# Patient Record
Sex: Female | Born: 1995 | Race: Black or African American | Hispanic: No | Marital: Single | Smoking: Former smoker
Health system: Southern US, Community
[De-identification: ages and names within clinical notes are randomized; demographics above are authoritative.]

## PROBLEM LIST (undated history)

## (undated) ENCOUNTER — Ambulatory Visit (HOSPITAL_COMMUNITY): Admission: EM | Payer: Medicaid Other | Source: Home / Self Care

## (undated) ENCOUNTER — Inpatient Hospital Stay (HOSPITAL_COMMUNITY): Payer: Self-pay

## (undated) ENCOUNTER — Ambulatory Visit (HOSPITAL_COMMUNITY): Admission: EM | Discharge status: Absent without leave | Payer: BLUE CROSS/BLUE SHIELD

## (undated) ENCOUNTER — Ambulatory Visit (HOSPITAL_COMMUNITY): Payer: BLUE CROSS/BLUE SHIELD | Source: Home / Self Care

## (undated) DIAGNOSIS — O139 Gestational [pregnancy-induced] hypertension without significant proteinuria, unspecified trimester: Secondary | ICD-10-CM

## (undated) DIAGNOSIS — Z789 Other specified health status: Secondary | ICD-10-CM

## (undated) HISTORY — PX: NO PAST SURGERIES: SHX2092

## (undated) HISTORY — DX: Gestational (pregnancy-induced) hypertension without significant proteinuria, unspecified trimester: O13.9

---
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
## 2017-04-10 ENCOUNTER — Encounter (HOSPITAL_COMMUNITY): Payer: Self-pay | Admitting: Emergency Medicine

## 2017-04-10 ENCOUNTER — Ambulatory Visit (HOSPITAL_COMMUNITY)
Admission: EM | Admit: 2017-04-10 | Discharge: 2017-04-10 | Disposition: A | Discharge status: Home / Self Care | Payer: BLUE CROSS/BLUE SHIELD | Attending: Family Medicine | Admitting: Family Medicine

## 2017-04-10 DIAGNOSIS — L03113 Cellulitis of right upper limb: Secondary | ICD-10-CM | POA: Diagnosis not present

## 2017-04-10 MED ORDER — HYDROXYZINE HCL 25 MG PO TABS: 25.0000 mg | ORAL_TABLET | Freq: Four times a day (QID) | ORAL | 0 refills | Status: DC

## 2017-04-10 MED ORDER — DOXYCYCLINE HYCLATE 100 MG PO CAPS
100.0000 mg | ORAL_CAPSULE | Freq: Two times a day (BID) | ORAL | 0 refills | Status: DC
Start: 2017-04-10 — End: 2018-12-23

## 2017-04-10 NOTE — ED Triage Notes (Signed)
[facility] a [facility] possibly infected [facility] that she had done 1 month ago.

## 2017-04-10 NOTE — ED Provider Notes (Signed)
CSN: [id number]     Arrival date & time 04/10/17  1754 History   None    Chief Complaint  Patient presents with  . Wound Check   (Consider location/radiation/quality/duration/timing/severity/associated sxs/prior Treatment) Patient c/o infected tattoo right forearm.     The history is provided by the patient.  Wound Check  This is a new problem. The problem occurs constantly.    History reviewed. No pertinent past medical history. History reviewed. No pertinent surgical history. History reviewed. No pertinent family history. Social History  Substance Use Topics  . Smoking status: Never Smoker  . Smokeless tobacco: Never Used  . Alcohol use No   OB History    No data available     Review of Systems  Constitutional: Negative.   HENT: Negative.   Eyes: Negative.   Respiratory: Negative.   Cardiovascular: Negative.   Gastrointestinal: Negative.   Endocrine: Negative.   Genitourinary: Negative.   Musculoskeletal: Negative.   Skin: Positive for rash.  Allergic/Immunologic: Negative.   Neurological: Negative.   Hematological: Negative.   Psychiatric/Behavioral: Negative.     Allergies  Tramadol and Other  Home Medications   Prior to Admission medications   Medication Sig Start Date End Date Taking? Authorizing Provider  doxycycline (VIBRAMYCIN) 100 MG capsule Take 1 capsule (100 mg total) by mouth 2 (two) times daily. 04/10/17   Deatra Canterxford, Delphia Kaylor J, FNP  hydrOXYzine (ATARAX/VISTARIL) 25 MG tablet Take 1 tablet (25 mg total) by mouth every 6 (six) hours. 04/10/17   Deatra Canterxford, Makeisha Jentsch J, FNP   Meds Ordered and Administered this Visit  Medications - No data to display  BP 138/63 (BP Location: Left Arm)   Pulse 72   Temp 98.4 F (36.9 C) (Oral)   Resp 18   SpO2 100%  No data found.   Physical Exam  Constitutional: She appears well-developed and well-nourished.  HENT:  Head: Normocephalic and atraumatic.  Eyes: Conjunctivae and EOM are normal. Pupils are equal,  round, and reactive to light.  Neck: Normal range of motion. Neck supple.  Cardiovascular: Normal rate, regular rhythm and normal heart sounds.   Pulmonary/Chest: Effort normal and breath sounds normal.  Skin:  Right forearm with recent tattoo that is raised and itchy.  Nursing note and vitals reviewed.   Urgent Care Course     Procedures (including critical care time)  Labs Review Labs Reviewed - No data to display  Imaging Review No results found.   Visual Acuity Review  Right Eye Distance:   Left Eye Distance:   Bilateral Distance:    Right Eye Near:   Left Eye Near:    Bilateral Near:         MDM   1. Cellulitis of right upper extremity    Doxycycline 100mg  one po bid x 10 days #20 Hydroxyzine 25 mg one po q 6 hours prn #24      Deatra CanterOxford, Cristalle Rohm J, FNP 04/10/17 1842

## 2017-09-18 ENCOUNTER — Ambulatory Visit (HOSPITAL_COMMUNITY)
Admission: EM | Admit: 2017-09-18 | Discharge: 2017-09-18 | Disposition: A | Discharge status: Home / Self Care | Payer: BLUE CROSS/BLUE SHIELD | Attending: Family Medicine | Admitting: Family Medicine

## 2017-09-18 ENCOUNTER — Encounter (HOSPITAL_COMMUNITY): Payer: Self-pay | Admitting: Family Medicine

## 2017-09-18 DIAGNOSIS — L509 Urticaria, unspecified: Secondary | ICD-10-CM

## 2017-09-18 MED ORDER — PREDNISONE 10 MG (21) PO TBPK: ORAL_TABLET | Freq: Every day | ORAL | 0 refills | Status: DC

## 2017-09-18 NOTE — ED Provider Notes (Signed)
  [facility]   [id number] 09/18/17 Arrival Time: 1213  ASSESSMENT & PLAN:  1. Hives     Meds ordered this encounter  Medications  . predniSONE (STERAPRED UNI-PAK 21 TAB) 10 MG (21) TBPK tablet    Sig: Take by mouth daily. Take as directed.    Dispense:  21 tablet    Refill:  0   Continue Benadryl as needed. Will f/u if not showing significant improvement over the next 24-48 hours.  Reviewed expectations re: course of current medical issues. Questions answered. Outlined signs and symptoms indicating need for more acute intervention. Patient verbalized understanding. After Visit Summary given.   SUBJECTIVE:  Teresa Harrington is a 21 y.o. female who presents with complaint of:  Rash: Abrupt onset yesterday after lunch. Similar rash approx 10-11 years ago. Torso and extremities. Much itching. Afebrile. No n/v. No respiratory symptoms. Benadryl without much help. No new exposures known.  ROS: As per HPI.  OBJECTIVE: Vitals:   09/18/17 1235  BP: 134/84  Pulse: 73  Resp: 18  Temp: 98.5 F (36.9 C)  SpO2: 100%    General appearance: alert; no distress Lungs: clear to auscultation bilaterally Heart: regular rate and rhythm Extremities: no edema Skin: warm and dry; torso and extremities with multiple wheals and dermatographism Psychological: alert and cooperative; normal mood and affect   Allergies  Allergen Reactions  . Tramadol Hives  . Other Hives    AGENT: Celery     Social History   Social History  . Marital status: Single    Spouse name: N/A  . Number of children: N/A  . Years of education: N/A   Occupational History  . Not on file.   Social History Main Topics  . Smoking status: Never Smoker  . Smokeless tobacco: Never Used  . Alcohol use No  . Drug use: No  . Sexual activity: Yes    Birth control/ protection: Condom   Other Topics Concern  . Not on file   Social History Narrative  . No narrative on file      Mardella LaymanHagler, Demetrion Wesby,  MD 09/18/17 1252

## 2017-09-18 NOTE — ED Triage Notes (Signed)
Pt here for possible allergic reaction to unknown substance. Itching rash all over. Took benadryl without relief.

## 2017-12-16 ENCOUNTER — Encounter (HOSPITAL_COMMUNITY): Payer: Self-pay | Admitting: Emergency Medicine

## 2017-12-16 ENCOUNTER — Emergency Department (HOSPITAL_COMMUNITY): Payer: Self-pay

## 2017-12-16 ENCOUNTER — Emergency Department (HOSPITAL_COMMUNITY)
Admission: EM | Admit: 2017-12-16 | Discharge: 2017-12-17 | Disposition: A | Discharge status: Home / Self Care | Payer: Self-pay | Attending: Physician Assistant | Admitting: Physician Assistant

## 2017-12-16 ENCOUNTER — Encounter (HOSPITAL_COMMUNITY): Payer: Self-pay

## 2017-12-16 ENCOUNTER — Other Ambulatory Visit: Payer: Self-pay

## 2017-12-16 ENCOUNTER — Emergency Department (HOSPITAL_COMMUNITY)
Admission: EM | Admit: 2017-12-16 | Discharge: 2017-12-16 | Disposition: A | Discharge status: Home / Self Care | Payer: Self-pay | Attending: Emergency Medicine | Admitting: Emergency Medicine

## 2017-12-16 ENCOUNTER — Emergency Department (HOSPITAL_COMMUNITY)
Admission: EM | Admit: 2017-12-16 | Discharge: 2017-12-16 | Discharge status: Absent without leave | Payer: BLUE CROSS/BLUE SHIELD

## 2017-12-16 DIAGNOSIS — R109 Unspecified abdominal pain: Secondary | ICD-10-CM

## 2017-12-16 DIAGNOSIS — B9689 Other specified bacterial agents as the cause of diseases classified elsewhere: Secondary | ICD-10-CM | POA: Insufficient documentation

## 2017-12-16 DIAGNOSIS — R1084 Generalized abdominal pain: Secondary | ICD-10-CM | POA: Insufficient documentation

## 2017-12-16 DIAGNOSIS — R1031 Right lower quadrant pain: Secondary | ICD-10-CM | POA: Insufficient documentation

## 2017-12-16 DIAGNOSIS — N760 Acute vaginitis: Secondary | ICD-10-CM | POA: Insufficient documentation

## 2017-12-16 DIAGNOSIS — R110 Nausea: Secondary | ICD-10-CM | POA: Insufficient documentation

## 2017-12-16 DIAGNOSIS — Z79899 Other long term (current) drug therapy: Secondary | ICD-10-CM | POA: Insufficient documentation

## 2017-12-16 DIAGNOSIS — R112 Nausea with vomiting, unspecified: Secondary | ICD-10-CM | POA: Insufficient documentation

## 2017-12-16 LAB — COMPREHENSIVE METABOLIC PANEL
ALBUMIN: 3.9 g/dL (ref 3.5–5.0)
ALT: 34 U/L (ref 14–54)
AST: 26 U/L (ref 15–41)
Alkaline Phosphatase: 79 U/L (ref 38–126)
Anion gap: 13 (ref 5–15)
BUN: 5 mg/dL — ABNORMAL LOW (ref 6–20)
CHLORIDE: 100 mmol/L — AB (ref 101–111)
CO2: 22 mmol/L (ref 22–32)
Calcium: 9.1 mg/dL (ref 8.9–10.3)
Creatinine, Ser: 0.78 mg/dL (ref 0.44–1.00)
GFR calc Af Amer: >60 mL/min (ref >60)
GLUCOSE: 146 mg/dL — AB (ref 65–99)
POTASSIUM: 3.7 mmol/L (ref 3.5–5.1)
SODIUM: 135 mmol/L (ref 135–145)
Total Bilirubin: 0.6 mg/dL (ref 0.3–1.2)
Total Protein: 7.7 g/dL (ref 6.5–8.1)

## 2017-12-16 LAB — URINALYSIS, ROUTINE W REFLEX MICROSCOPIC
Bilirubin Urine: NEGATIVE
GLUCOSE, UA: NEGATIVE mg/dL
HGB URINE DIPSTICK: NEGATIVE
KETONES UR: 5 mg/dL — AB
LEUKOCYTES UA: NEGATIVE
Nitrite: NEGATIVE
PH: 6 (ref 5.0–8.0)
Protein, ur: NEGATIVE mg/dL
Specific Gravity, Urine: 1.013 (ref 1.005–1.030)

## 2017-12-16 LAB — CBC
HEMATOCRIT: 43.5 % (ref 36.0–46.0)
Hemoglobin: 14.7 g/dL (ref 12.0–15.0)
MCH: 31 pg (ref 26.0–34.0)
MCHC: 33.8 g/dL (ref 30.0–36.0)
MCV: 91.8 fL (ref 78.0–100.0)
Platelets: 318 10*3/uL (ref 150–400)
RBC: 4.74 MIL/uL (ref 3.87–5.11)
RDW: 12.7 % (ref 11.5–15.5)
WBC: 8.3 10*3/uL (ref 4.0–10.5)

## 2017-12-16 LAB — I-STAT BETA HCG BLOOD, ED (MC, WL, AP ONLY): I-stat hCG, quantitative: <5 m[IU]/mL (ref <5)

## 2017-12-16 LAB — WET PREP, GENITAL
Sperm: NONE SEEN
Trich, Wet Prep: NONE SEEN
Yeast Wet Prep HPF POC: NONE SEEN

## 2017-12-16 LAB — LIPASE, BLOOD: LIPASE: 22 U/L (ref 11–51)

## 2017-12-16 IMAGING — US US TRANSVAGINAL NON-OB
1 series · 13 of 25 positions shown · non-contrast
Comparison: None.

CLINICAL DATA: Right lower quadrant pain for 2 days.

EXAM:
TRANSABDOMINAL AND TRANSVAGINAL ULTRASOUND OF PELVIS
DOPPLER ULTRASOUND OF OVARIES
TECHNIQUE: Both transabdominal and transvaginal ultrasound examinations of the
pelvis were performed. Transabdominal technique was performed for
global imaging of the pelvis including uterus, ovaries, adnexal
regions, and pelvic cul-de-sac.
It was necessary to proceed with endovaginal exam following the
transabdominal exam to visualize the endometrium and ovaries. Color
and duplex Doppler ultrasound was utilized to evaluate blood flow to
the ovaries.

[Series 1: us transvaginal non-ob · 0.20mm/px · 13 of 78 slices shown]
[im 1/78]
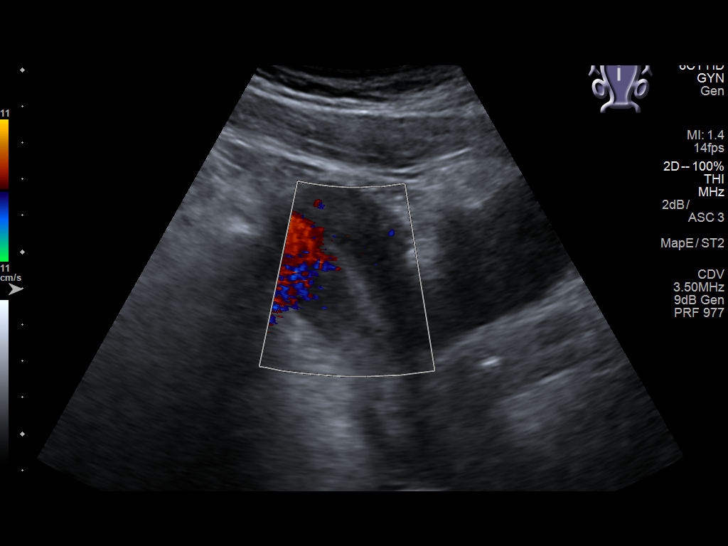
[im 7/78]
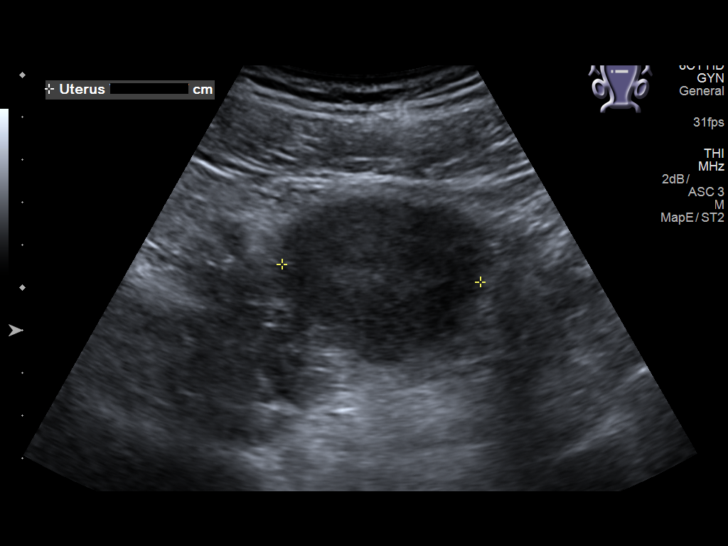
[im 13/78]
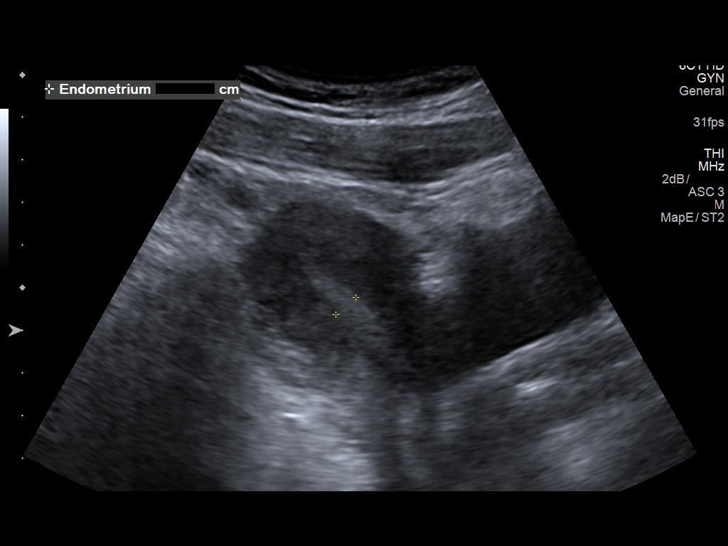
[im 20/78]
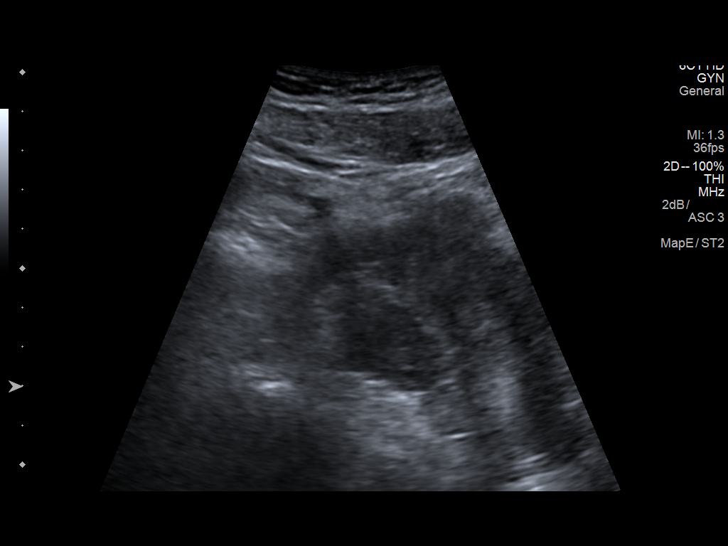
[im 26/78]
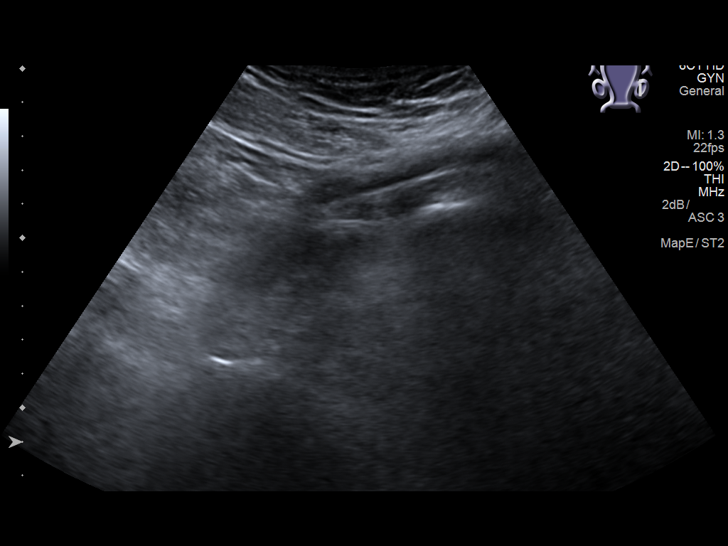
[im 33/78]
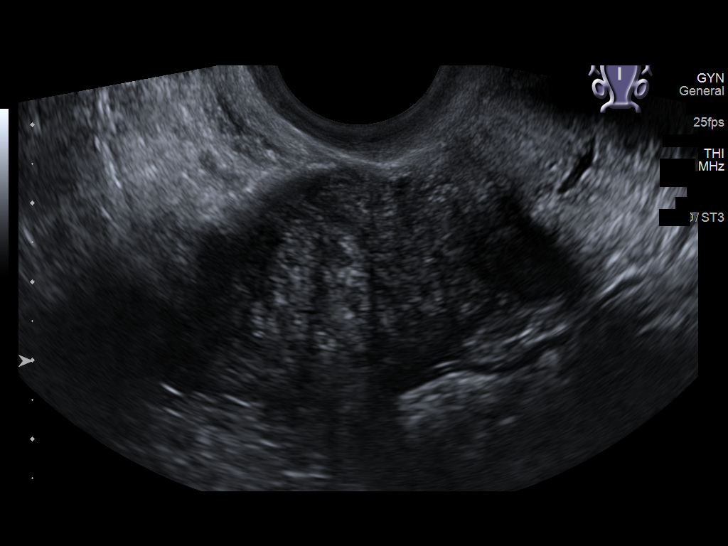
[im 39/78]
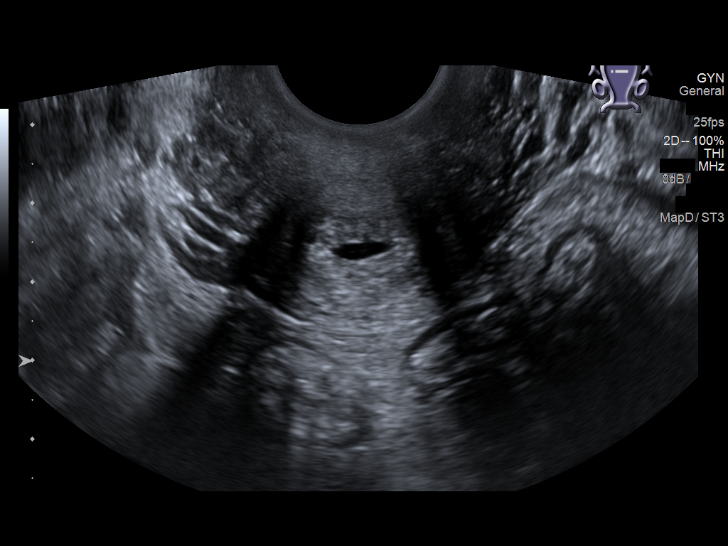
[im 45/78]
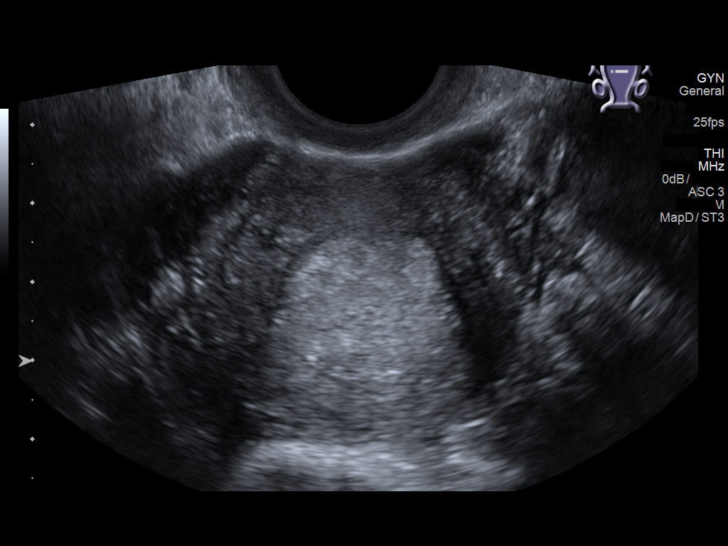
[im 52/78]
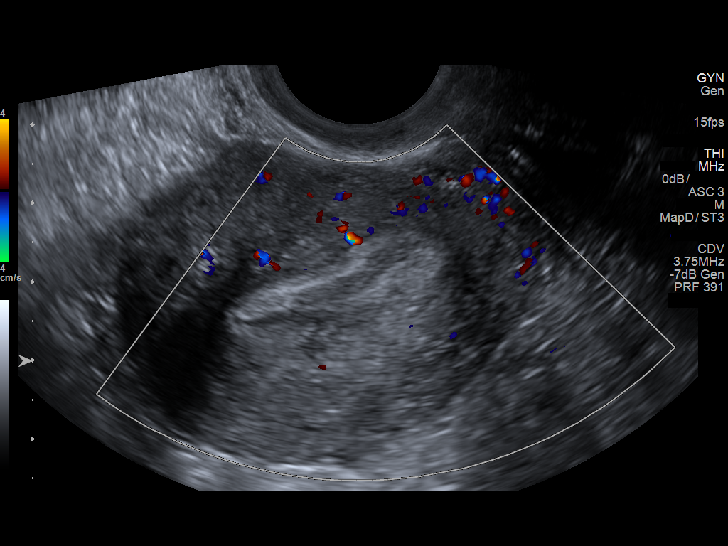
[im 58/78]
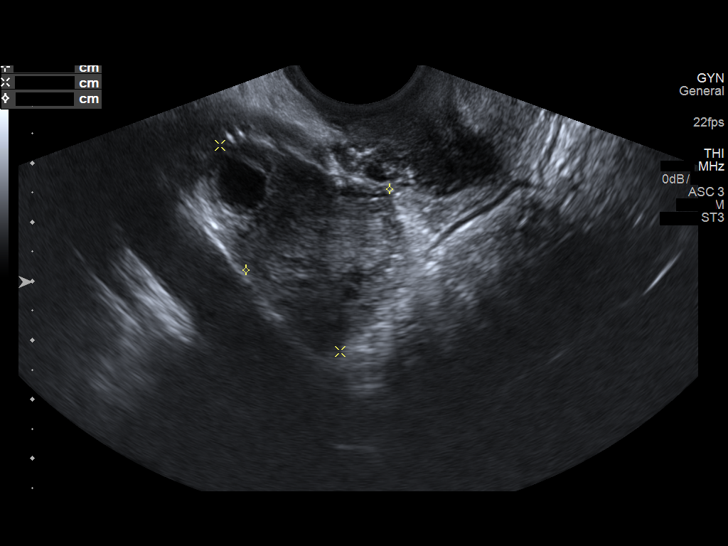
[im 65/78]
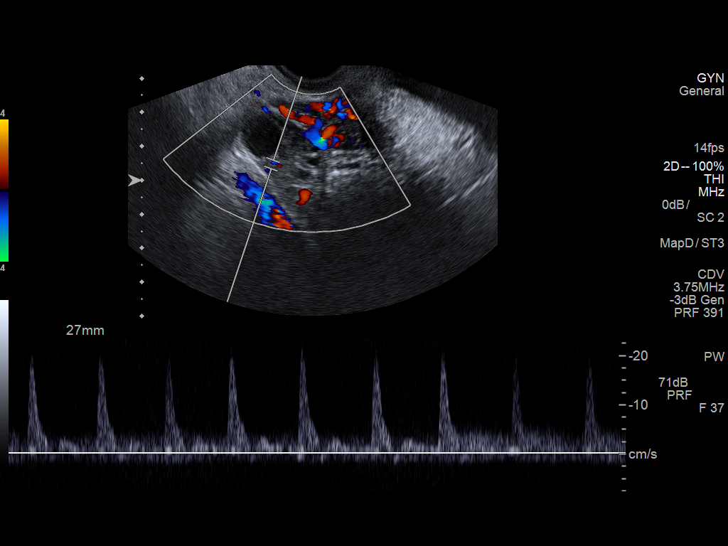
[im 71/78]
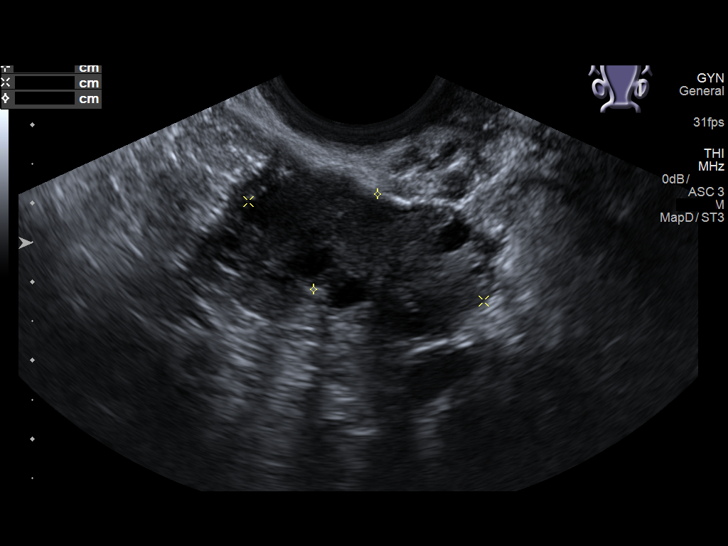
[im 78/78]
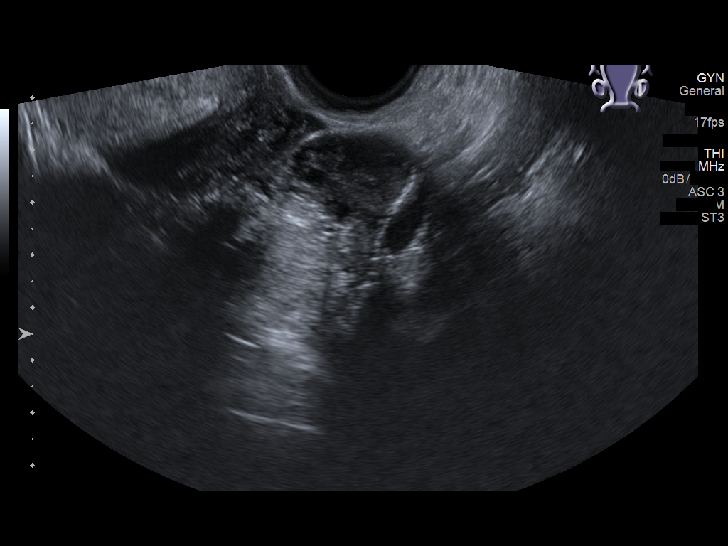

[13 of 25 positions shown; findings below may reference images not displayed]

FINDINGS: Uterus

Measurements: 8 x 3.7 x 5.6 cm. Uterus is anteverted. No fibroids or
other mass visualized. Small amount of fluid in the lower uterine
segment/endocervical canal. Small nabothian cysts.

Endometrium

Thickness: 11.3 mm.  No focal abnormality visualized.

Right ovary

Measurements: 4 x 2.8 x 3.8 cm. Normal follicular cystic changes.
Normal appearance/no adnexal mass.

Left ovary

Measurements: 3.3 x 1.5 x 2.1 cm. Normal follicular cystic changes.
Normal appearance/no adnexal mass.

Pulsed Doppler evaluation of both ovaries demonstrates normal
low-resistance arterial and venous waveforms.

Other findings

Small amount of free fluid in the pelvis.
IMPRESSION: Normal ultrasound appearance of the uterus and ovaries. Normal
follicular cystic changes. Small amount of fluid in the endocervical
canal and in the pelvis is likely physiologic.

## 2017-12-16 MED ORDER — METRONIDAZOLE 500 MG PO TABS: 500.0000 mg | ORAL_TABLET | Freq: Two times a day (BID) | ORAL | 0 refills | Status: DC

## 2017-12-16 MED ORDER — METRONIDAZOLE 500 MG PO TABS
500.0000 mg | ORAL_TABLET | Freq: Once | ORAL | Status: AC
Start: 2017-12-17 — End: 2017-12-17
  Administered 2017-12-17: 500 mg via ORAL
  Filled 2017-12-16: qty 1

## 2017-12-16 MED ORDER — PROMETHAZINE HCL 25 MG PO TABS: 25.0000 mg | ORAL_TABLET | Freq: Four times a day (QID) | ORAL | 0 refills | Status: DC | PRN

## 2017-12-16 MED ORDER — DICYCLOMINE HCL 10 MG PO CAPS: 10.0000 mg | ORAL_CAPSULE | Freq: Three times a day (TID) | ORAL | 0 refills | Status: DC | PRN

## 2017-12-16 MED ORDER — DICYCLOMINE HCL 10 MG PO CAPS
10.0000 mg | ORAL_CAPSULE | Freq: Once | ORAL | Status: AC
  Administered 2017-12-16: 10 mg via ORAL
  Filled 2017-12-16: qty 1

## 2017-12-16 MED ORDER — ONDANSETRON 4 MG PO TBDP
4.0000 mg | ORAL_TABLET | Freq: Once | ORAL | Status: AC
  Administered 2017-12-16: 4 mg via ORAL
  Filled 2017-12-16: qty 1

## 2017-12-16 NOTE — ED Provider Notes (Signed)
[place] EMERGENCY DEPARTMENT Provider Note   CSN: [id number] Arrival date & time: 12/16/17  2016     History   Chief Complaint Chief Complaint  Patient presents with  . Abdominal Pain    HPI Krisha Beegle is a 22 y.o. female.  HPI   Patient is a 22 year old female presenting with abdominal pain.  Patient was seen earlier today in the emergency department.  She had normal labs and her nausea was controlled with antiemetics and her pain was coming and going so was thought to be to a secondary viral gastroenteritis.  Patient reports the pain is gotten worse.  It is located more in the adnexa now.  She came back for reevaluation.  Patient reports the pain is now constant.  Her last menstrual period is 1/2 weeks ago.  She denies any dysuria, discharge.   History reviewed. No pertinent past medical history.  There are no active problems to display for this patient.   History reviewed. No pertinent surgical history.  OB History    No data available       Home Medications    Prior to Admission medications   Medication Sig Start Date End Date Taking? Authorizing Provider  cephALEXin (KEFLEX) 500 MG capsule Take 1 capsule (500 mg total) by mouth 3 (three) times daily. 500mg  po tid x 10 days qs 01/12/13   Isaac Bliss, MD  clotrimazole-betamethasone (LOTRISONE) cream Apply to affected area 2 times daily prn 08/10/13   Janne Napoleon, NP  dicyclomine (BENTYL) 10 MG capsule Take 1 capsule (10 mg total) by mouth 3 (three) times daily as needed for spasms. 12/16/17   Little, Wenda Overland, MD  promethazine (PHENERGAN) 25 MG tablet Take 1 tablet (25 mg total) by mouth every 6 (six) hours as needed for nausea or vomiting. 12/16/17   Little, Wenda Overland, MD    Family History No family history on file.  Social History Social History   Tobacco Use  . Smoking status: Never Smoker  . Smokeless tobacco: Never Used  Substance Use Topics  . Alcohol use: Yes   Comment: social  . Drug use: Not on file     Allergies   Patient has no known allergies.   Review of Systems Review of Systems  Constitutional: Negative for activity change, fatigue and fever.  Respiratory: Negative for shortness of breath.   Cardiovascular: Negative for chest pain.  Gastrointestinal: Positive for abdominal pain, nausea and vomiting.  All other systems reviewed and are negative.    Physical Exam Updated Vital Signs BP 137/82 (BP Location: Right Arm)   Pulse 64   Temp 98 F (36.7 C) (Oral)   Resp 16   Ht 5\' 3"  (1.6 m)   Wt 74.8 kg (165 lb)   LMP 12/05/2017 (Within Days)   SpO2 99%   BMI 29.23 kg/m   Physical Exam  Constitutional: She is oriented to person, place, and time. She appears well-developed and well-nourished.  HENT:  Head: Normocephalic and atraumatic.  Eyes: Right eye exhibits no discharge.  Cardiovascular: Normal rate, regular rhythm and normal heart sounds.  No murmur heard. Pulmonary/Chest: Effort normal and breath sounds normal. She has no wheezes. She has no rales.  Abdominal: Soft. She exhibits no distension. There is tenderness.  Right adnexal tenderness.  Genitourinary: Vagina normal.  Genitourinary Comments: White colored vaginal discharge.  Pain in the right adnexa.  No cervical motion tenderness.  Neurological: She is oriented to person, place, and time.  Skin: Skin  is warm and dry. She is not diaphoretic.  Psychiatric: She has a normal mood and affect.  Nursing note and vitals reviewed.    ED Treatments / Results  Labs (all labs ordered are listed, but only abnormal results are displayed) Labs Reviewed  WET PREP, GENITAL  RPR  HIV ANTIBODY (ROUTINE TESTING)  GC/CHLAMYDIA PROBE AMP () NOT AT [facility]    EKG  EKG Interpretation None       Radiology No results found.  Procedures Procedures (including critical care time)  Medications Ordered in ED Medications - No data to display   Initial  Impression / Assessment and Plan / ED Course  I have reviewed the triage vital signs and the nursing notes.  Pertinent labs & imaging results that were available during my care of the patient were reviewed by me and considered in my medical decision making (see chart for details).  Clinical Course as of Dec 18 12  Mon Dec 17, 2017  0032 Ultrasounds are without evidence of acute abnormality.  No evidence of torsion.  Small amount of free fluid is noted.   [HM]    Clinical Course User Index [HM] Muthersbaugh, Jarrett Soho, PA-C   Patient is a 22 year old female presenting with abdominal pain.  Patient was seen earlier today in the emergency department.  She had normal labs and her nausea was controlled with antiemetics and her pain was coming and going so was thought to be to a secondary viral gastroenteritis.  Patient reports the pain is gotten worse.  It is located more in the adnexa now.  She came back for reevaluation.  Patient reports the pain is now constant.  Her last menstrual period is 1/2 weeks ago.  She denies any dysuria, discharge.   10:49 PM Patient is mildly tearful.  Will do transvaginal ultrasound.  Patient had negative pregnancy.  Concern for torsion.  Pain is really adnexal.  However if ultrasound is negative given significant pain will do CT abdomen pelvis.  12:14 AM Will sign out ultrasound pending patient.  Final Clinical Impressions(s) / ED Diagnoses   Final diagnoses:  None    ED Discharge Orders    None       Macarthur Critchley, MD 12/18/17 [id number]

## 2017-12-16 NOTE — ED Triage Notes (Signed)
Pt states she began having abdominal cramping yesterday with intermittent nausea.

## 2017-12-16 NOTE — Discharge Instructions (Signed)
RETURN TO ER IF YOUR ABDOMINAL PAIN WORSENS OR YOU DEVELOP FEVER, URINARY PROBLEMS, OR SUDDEN CHANGE IN SYMPTOMS.

## 2017-12-16 NOTE — ED Notes (Signed)
Patient transported to Ultrasound 

## 2017-12-16 NOTE — ED Triage Notes (Signed)
Reports being seen this morning for the same and sent home with phenergan and something for pain.  Declined CT this morning but feels the medication isn't helping.  Describes pain as cramping in lower abdomen that goes from left to right.

## 2017-12-16 NOTE — ED Provider Notes (Signed)
[place] EMERGENCY DEPARTMENT Provider Note   CSN: [id number] Arrival date & time: 12/16/17  0749     History   Chief Complaint Chief Complaint  Patient presents with  . Abdominal Pain    HPI Jessica Hansen is a 22 y.o. female.  22 year old female who presents with abdominal pain and nausea.  Patient states that yesterday she began having generalized abdominal cramping that has been worse in her lower abdomen, worse on the right side but also involving the left side, occasionally radiating to both sides of her back.  She denies any vomiting but has had intermittent nausea.  Her pain is worse when she is laying flat and worse with eating.  It feels slightly better when she is walking.  She denies any fevers, diarrhea, constipation, blood in her stool, urinary symptoms, or vaginal bleeding/discharge.  Last menstrual period was 1.5 weeks ago.    The history is provided by the patient.  Abdominal Pain      History reviewed. No pertinent past medical history.  There are no active problems to display for this patient.   History reviewed. No pertinent surgical history.  OB History    No data available       Home Medications    Prior to Admission medications   Medication Sig Start Date End Date Taking? Authorizing Provider  cephALEXin (KEFLEX) 500 MG capsule Take 1 capsule (500 mg total) by mouth 3 (three) times daily. 500mg  po tid x 10 days qs 01/12/13   Isaac Bliss, MD  clotrimazole-betamethasone (LOTRISONE) cream Apply to affected area 2 times daily prn 08/10/13   Janne Napoleon, NP  dicyclomine (BENTYL) 10 MG capsule Take 1 capsule (10 mg total) by mouth 3 (three) times daily as needed for spasms. 12/16/17   Little, Wenda Overland, MD  promethazine (PHENERGAN) 25 MG tablet Take 1 tablet (25 mg total) by mouth every 6 (six) hours as needed for nausea or vomiting. 12/16/17   Little, Wenda Overland, MD    Family History History reviewed. No pertinent family  history.  Social History Social History   Tobacco Use  . Smoking status: Never Smoker  . Smokeless tobacco: Never Used  Substance Use Topics  . Alcohol use: Yes    Comment: social  . Drug use: Not on file     Allergies   Patient has no known allergies.   Review of Systems Review of Systems  Gastrointestinal: Positive for abdominal pain.   All other systems reviewed and are negative except that which was mentioned in HPI   Physical Exam Updated Vital Signs BP 121/79   Pulse 70   Temp 98.3 F (36.8 C) (Oral)   Resp 14   LMP 12/05/2017 (Within Days)   SpO2 100%   Physical Exam  Constitutional: She is oriented to person, place, and time. She appears well-developed and well-nourished. No distress.  HENT:  Head: Normocephalic and atraumatic.  Moist mucous membranes  Eyes: Conjunctivae are normal. Pupils are equal, round, and reactive to light.  Neck: Neck supple.  Cardiovascular: Normal rate, regular rhythm and normal heart sounds.  No murmur heard. Pulmonary/Chest: Effort normal and breath sounds normal.  Abdominal: Soft. Bowel sounds are normal. She exhibits no distension. There is tenderness. There is tenderness at McBurney's point. There is no rebound and no guarding.  Generalized abdominal tenderness worst in RLQ but also present in upper abd and LLQ, no peritonitis  Musculoskeletal: She exhibits no edema.  Neurological: She is alert and oriented  to person, place, and time.  Fluent speech  Skin: Skin is warm and dry.  Psychiatric: She has a normal mood and affect. Judgment normal.  Nursing note and vitals reviewed.    ED Treatments / Results  Labs (all labs ordered are listed, but only abnormal results are displayed) Labs Reviewed  COMPREHENSIVE METABOLIC PANEL - Abnormal; Notable for the following components:      Result Value   Chloride 100 (*)    Glucose, Bld 146 (*)    BUN 5 (*)    All other components within normal limits  URINALYSIS, ROUTINE W  REFLEX MICROSCOPIC - Abnormal; Notable for the following components:   Ketones, ur 5 (*)    All other components within normal limits  LIPASE, BLOOD  CBC  I-STAT BETA HCG BLOOD, ED ([facility], [facility], [facility] ONLY)    EKG  EKG Interpretation None       Radiology No results found.  Procedures Procedures (including critical care time)  Medications Ordered in ED Medications  ondansetron (ZOFRAN-ODT) disintegrating tablet 4 mg (4 mg Oral Given 12/16/17 1028)  dicyclomine (BENTYL) capsule 10 mg (10 mg Oral Given 12/16/17 1028)     Initial Impression / Assessment and Plan / ED Course  I have reviewed the triage vital signs and the nursing notes.  Pertinent labs & imaging results that were available during my care of the patient were reviewed by me and considered in my medical decision making (see chart for details).    PT w/ 1 day of nausea and abdominal pain that she describes as generalized but also involving left and right lower quadrants.  She did have right lower quadrant tenderness on exam but also had some generalized abdominal tenderness.  Lab work shows normal UA, negative pregnancy test, unremarkable CMP and CBC.  I doubt gallbladder or liver pathology based on normal lab work and pain is worse in the lower abdomen.  She has not had any vomiting or diarrhea to suggest gastroenteritis although early gastroenteritis is a possibility.  Considered ovarian cyst/torsion but her pain is bilateral and more generalized than focal.  Without any vaginal discharge my suspicion for PID is low.  Some of her description, including generalized pain and also pain that improves when walking around, is very atypical from appendicitis but I explained the possibility of appendicitis based on tenderness on exam. She felt improved after receiving bentyl and zofran. I had a long discussion regarding risks and benefits of CT scan vs close observation of sx. She voiced understanding, declined CT scan to evaluate for  appendicitis and preferred just watching at home. She  felt very hungry, and ate graham crackers and water with no problems.  I have extensively reviewed return precautions with her, she has voiced understanding and was discharged in satisfactory condition.  Final Clinical Impressions(s) / ED Diagnoses   Final diagnoses:  Abdominal pain, unspecified abdominal location  Nausea    ED Discharge Orders        Ordered    promethazine (PHENERGAN) 25 MG tablet  Every 6 hours PRN     12/16/17 1239    dicyclomine (BENTYL) 10 MG capsule  3 times daily PRN     12/16/17 1239       Little, Wenda Overland, MD 12/16/17 1258

## 2017-12-16 NOTE — Discharge Instructions (Addendum)
You were found to have bacterial vaginosis.  Please stay away from any new materials near your vagina, including scented body washes.

## 2017-12-17 ENCOUNTER — Emergency Department (HOSPITAL_COMMUNITY): Payer: Self-pay

## 2017-12-17 LAB — GC/CHLAMYDIA PROBE AMP (~~LOC~~) NOT AT ARMC
Chlamydia: POSITIVE — AB
Neisseria Gonorrhea: NEGATIVE

## 2017-12-17 LAB — RPR: RPR Ser Ql: NONREACTIVE

## 2017-12-17 MED ORDER — ONDANSETRON 4 MG PO TBDP: ORAL_TABLET | ORAL | 0 refills | Status: DC

## 2017-12-17 MED ORDER — MORPHINE SULFATE (PF) 4 MG/ML IV SOLN
4.0000 mg | Freq: Once | INTRAVENOUS | Status: AC
  Administered 2017-12-17: 4 mg via INTRAVENOUS
  Filled 2017-12-17: qty 1

## 2017-12-17 MED ORDER — IOPAMIDOL (ISOVUE-300) INJECTION 61%
INTRAVENOUS | Status: AC
  Administered 2017-12-17: 100 mL
  Filled 2017-12-17: qty 100

## 2017-12-17 NOTE — ED Provider Notes (Signed)
Care assumed from Dr. Thomasene Lot.  Please see her full H&P.  In short,  Jessica Hansen is a 22 y.o. female presents for right lower quadrant and adnexal abdominal pain.  She was seen earlier today with normal labs and her symptoms were thought to be secondary to a viral gastroenteritis.  As the day progressed, the patient's pain is gotten worse and has localized more to her adnexa.  Initial provider performed vaginal exam without cervical motion tenderness.  Pain was located in the right adnexa.  Physical Exam  BP 137/82 (BP Location: Right Arm)   Pulse 64   Temp 98 F (36.7 C) (Oral)   Resp 16   Ht 5\' 3"  (1.6 m)   Wt 74.8 kg (165 lb)   LMP 12/05/2017 (Within Days)   SpO2 99%   BMI 29.23 kg/m   Physical Exam  Constitutional: She appears well-developed and well-nourished. No distress.  HENT:  Head: Normocephalic.  Eyes: Conjunctivae are normal. No scleral icterus.  Neck: Normal range of motion.  Cardiovascular: Normal rate and intact distal pulses.  Pulmonary/Chest: Effort normal.  Musculoskeletal: Normal range of motion.  Neurological: She is alert.  Skin: Skin is warm and dry.  Nursing note and vitals reviewed.   Results for orders placed or performed during the hospital encounter of 12/16/17  Wet prep, genital  Result Value Ref Range   Yeast Wet Prep HPF POC NONE SEEN NONE SEEN   Trich, Wet Prep NONE SEEN NONE SEEN   Clue Cells Wet Prep HPF POC PRESENT (A) NONE SEEN   WBC, Wet Prep HPF POC MANY (A) NONE SEEN   Sperm NONE SEEN    US Transvaginal Non-ob  Result Date: 12/17/2017 CLINICAL DATA:  Right lower quadrant pain for 2 days. EXAM: TRANSABDOMINAL AND TRANSVAGINAL ULTRASOUND OF PELVIS DOPPLER ULTRASOUND OF OVARIES TECHNIQUE: Both transabdominal and transvaginal ultrasound examinations of the pelvis were performed. Transabdominal technique was performed for global imaging of the pelvis including uterus, ovaries, adnexal regions, and pelvic cul-de-sac. It was necessary to  proceed with endovaginal exam following the transabdominal exam to visualize the endometrium and ovaries. Color and duplex Doppler ultrasound was utilized to evaluate blood flow to the ovaries. COMPARISON:  None. FINDINGS: Uterus Measurements: 8 x 3.7 x 5.6 cm. Uterus is anteverted. No fibroids or other mass visualized. Small amount of fluid in the lower uterine segment/endocervical canal. Small nabothian cysts. Endometrium Thickness: 11.3 mm.  No focal abnormality visualized. Right ovary Measurements: 4 x 2.8 x 3.8 cm. Normal follicular cystic changes. Normal appearance/no adnexal mass. Left ovary Measurements: 3.3 x 1.5 x 2.1 cm. Normal follicular cystic changes. Normal appearance/no adnexal mass. Pulsed Doppler evaluation of both ovaries demonstrates normal low-resistance arterial and venous waveforms. Other findings Small amount of free fluid in the pelvis. IMPRESSION: Normal ultrasound appearance of the uterus and ovaries. Normal follicular cystic changes. Small amount of fluid in the endocervical canal and in the pelvis is likely physiologic. Electronically Signed   By: Lucienne Capers M.D.   On: 12/17/2017 00:23   US Pelvis Complete  Result Date: 12/17/2017 CLINICAL DATA:  Right lower quadrant pain for 2 days. EXAM: TRANSABDOMINAL AND TRANSVAGINAL ULTRASOUND OF PELVIS DOPPLER ULTRASOUND OF OVARIES TECHNIQUE: Both transabdominal and transvaginal ultrasound examinations of the pelvis were performed. Transabdominal technique was performed for global imaging of the pelvis including uterus, ovaries, adnexal regions, and pelvic cul-de-sac. It was necessary to proceed with endovaginal exam following the transabdominal exam to visualize the endometrium and ovaries. Color and duplex Doppler  ultrasound was utilized to evaluate blood flow to the ovaries. COMPARISON:  None. FINDINGS: Uterus Measurements: 8 x 3.7 x 5.6 cm. Uterus is anteverted. No fibroids or other mass visualized. Small amount of fluid in the lower  uterine segment/endocervical canal. Small nabothian cysts. Endometrium Thickness: 11.3 mm.  No focal abnormality visualized. Right ovary Measurements: 4 x 2.8 x 3.8 cm. Normal follicular cystic changes. Normal appearance/no adnexal mass. Left ovary Measurements: 3.3 x 1.5 x 2.1 cm. Normal follicular cystic changes. Normal appearance/no adnexal mass. Pulsed Doppler evaluation of both ovaries demonstrates normal low-resistance arterial and venous waveforms. Other findings Small amount of free fluid in the pelvis. IMPRESSION: Normal ultrasound appearance of the uterus and ovaries. Normal follicular cystic changes. Small amount of fluid in the endocervical canal and in the pelvis is likely physiologic. Electronically Signed   By: Lucienne Capers M.D.   On: 12/17/2017 00:23   Ct Abdomen Pelvis W Contrast  Result Date: 12/17/2017 CLINICAL DATA:  Abdominal distension with nausea and vomiting EXAM: CT ABDOMEN AND PELVIS WITH CONTRAST TECHNIQUE: Multidetector CT imaging of the abdomen and pelvis was performed using the standard protocol following bolus administration of intravenous contrast. CONTRAST:  166mL ISOVUE-300 IOPAMIDOL (ISOVUE-300) INJECTION 61% COMPARISON:  None. FINDINGS: Lower chest: No basilar pulmonary nodules or pleural effusion. No apical pericardial effusion. Hepatobiliary: Normal hepatic contours and density. No visible biliary dilatation. Normal gallbladder. Pancreas: Normal parenchymal contours without ductal dilatation. No peripancreatic fluid collection. Spleen: Normal. Adrenals/Urinary Tract: --Adrenal glands: Normal. --Right kidney/ureter: No hydronephrosis, perinephric stranding or nephrolithiasis. No obstructing ureteral stones. --Left kidney/ureter: No hydronephrosis, perinephric stranding or nephrolithiasis. No obstructing ureteral stones. --Urinary bladder: Normal appearance for the degree of distention. Stomach/Bowel: --Stomach/Duodenum: No hiatal hernia or other gastric abnormality.  Normal duodenal course. --Small bowel: No dilatation or inflammation. --Colon: No focal abnormality. --Appendix: Normal. Vascular/Lymphatic: Normal aorta and major abdominal arteries. The portal vein, splenic vein, superior mesenteric vein and IVC are patent. No abdominal or pelvic lymphadenopathy. Reproductive: Normal uterus and ovaries. Musculoskeletal. No bony spinal canal stenosis or focal osseous abnormality. Other: None. IMPRESSION: No abdominal or pelvic abnormality. Electronically Signed   By: Ulyses Jarred M.D.   On: 12/17/2017 01:52   [place] Art/ven Flow Abd Pelv Doppler  Result Date: 12/17/2017 CLINICAL DATA:  Right lower quadrant pain for 2 days. EXAM: TRANSABDOMINAL AND TRANSVAGINAL ULTRASOUND OF PELVIS DOPPLER ULTRASOUND OF OVARIES TECHNIQUE: Both transabdominal and transvaginal ultrasound examinations of the pelvis were performed. Transabdominal technique was performed for global imaging of the pelvis including uterus, ovaries, adnexal regions, and pelvic cul-de-sac. It was necessary to proceed with endovaginal exam following the transabdominal exam to visualize the endometrium and ovaries. Color and duplex Doppler ultrasound was utilized to evaluate blood flow to the ovaries. COMPARISON:  None. FINDINGS: Uterus Measurements: 8 x 3.7 x 5.6 cm. Uterus is anteverted. No fibroids or other mass visualized. Small amount of fluid in the lower uterine segment/endocervical canal. Small nabothian cysts. Endometrium Thickness: 11.3 mm.  No focal abnormality visualized. Right ovary Measurements: 4 x 2.8 x 3.8 cm. Normal follicular cystic changes. Normal appearance/no adnexal mass. Left ovary Measurements: 3.3 x 1.5 x 2.1 cm. Normal follicular cystic changes. Normal appearance/no adnexal mass. Pulsed Doppler evaluation of both ovaries demonstrates normal low-resistance arterial and venous waveforms. Other findings Small amount of free fluid in the pelvis. IMPRESSION: Normal ultrasound appearance of the uterus and  ovaries. Normal follicular cystic changes. Small amount of fluid in the endocervical canal and in the pelvis is likely physiologic. Electronically  Signed   By: Lucienne Capers M.D.   On: 12/17/2017 00:23     ED Course/Procedures   Clinical Course as of Dec 18 319  Mon Dec 17, 2017  0032 Ultrasounds are without evidence of acute abnormality.  No evidence of torsion.  Small amount of free fluid is noted.   [HM]    Clinical Course User Index [HM] Suleyma Wafer, Gwenlyn Perking    Procedures  MDM    Patient with right lower quadrant abdominal pain.  No emesis here in the emergency department.  Ultrasound without evidence of acute abnormality CT scan without evidence of appendicitis.  No evidence of ovarian torsion or ovarian cyst.  Patient symptoms may be secondary to gastritis.  She is to follow with her primary care provider and OB/GYN.  Flagyl given for bacterial vaginosis.  3:21 AM On repeat exam abdomen is soft without rebound or guarding.  Minimal tenderness persists.  Patient reports her pain is controlled at this time.   BV (bacterial vaginosis)  Right lower quadrant abdominal pain      Haidyn Kilburg, Gwenlyn Perking 12/17/17 0321    Macarthur Critchley, MD 12/19/17 1450

## 2017-12-17 NOTE — ED Notes (Signed)
Patient transported to CT 

## 2017-12-18 ENCOUNTER — Telehealth: Payer: Self-pay | Admitting: Medical

## 2017-12-18 DIAGNOSIS — A749 Chlamydial infection, unspecified: Secondary | ICD-10-CM

## 2017-12-18 LAB — HIV ANTIBODY (ROUTINE TESTING W REFLEX): HIV Screen 4th Generation wRfx: NONREACTIVE

## 2017-12-18 MED ORDER — AZITHROMYCIN 250 MG PO TABS: 1000.0000 mg | ORAL_TABLET | Freq: Once | ORAL | 0 refills | Status: AC

## 2017-12-18 NOTE — Telephone Encounter (Addendum)
Jessica Hansen tested positive for  Chlamydia. Patient was called by RN and allergies and pharmacy confirmed. Rx sent to pharmacy of choice.   Luvenia Redden, PA-C 12/18/2017 3:07 PM      ----- Message from Bjorn Loser, RN sent at 12/18/2017  2:29 PM EST ----- This patient tested positive for ::"Chlamydia",  She,"is allergic to  Celery Oil",  I have informed the patient of her results and confirmed her pharmacy is correct in her chart. Please send Rx.   Thank you,   Bjorn Loser, RN   Results faxed to [facility] Department.

## 2018-08-20 ENCOUNTER — Encounter (HOSPITAL_COMMUNITY): Payer: Self-pay | Admitting: *Deleted

## 2018-08-20 ENCOUNTER — Ambulatory Visit (HOSPITAL_COMMUNITY)
Admission: EM | Admit: 2018-08-20 | Discharge: 2018-08-20 | Disposition: A | Discharge status: Home / Self Care | Payer: BLUE CROSS/BLUE SHIELD | Attending: Family Medicine | Admitting: Family Medicine

## 2018-08-20 ENCOUNTER — Other Ambulatory Visit: Payer: Self-pay

## 2018-08-20 DIAGNOSIS — Z3201 Encounter for pregnancy test, result positive: Secondary | ICD-10-CM | POA: Diagnosis not present

## 2018-08-20 DIAGNOSIS — Z3A01 Less than 8 weeks gestation of pregnancy: Secondary | ICD-10-CM

## 2018-08-20 DIAGNOSIS — R112 Nausea with vomiting, unspecified: Secondary | ICD-10-CM | POA: Diagnosis not present

## 2018-08-20 LAB — POCT PREGNANCY, URINE: PREG TEST UR: POSITIVE — AB

## 2018-08-20 MED ORDER — PRENATAL COMPLETE 14-0.4 MG PO TABS: 1.0000 | ORAL_TABLET | Freq: Every day | ORAL | 1 refills | Status: DC

## 2018-08-20 NOTE — ED Triage Notes (Signed)
C/o nausea and vomiitng onset 1 month ago.

## 2018-08-20 NOTE — Discharge Instructions (Addendum)
Congratulations on your pregnancy I am prescribing you some prenatal vitamins to go ahead and start We will give you a list of medications that are safe in pregnancy Follow-up with your OB/GYN as planned

## 2018-08-20 NOTE — ED Provider Notes (Signed)
[facility]    CSN: [id number] Arrival date & time: 08/20/18  0809     History   Chief Complaint Chief Complaint  Patient presents with  . Emesis    HPI Teresa Harrington is a 22 y.o. female.   Patient is a 22 year old female that presents today with nausea, vomiting.  This has been ongoing for the past week.  She took a pregnancy test at home that was positive. She is here to confirm her pregnancy. She does have an OB/GYN and is scheduled to see them in a few weeks. Her LMP was in August. She denies any associated abdominal pain, back pain, vaginal bleeding.   ROS per HPI      History reviewed. No pertinent past medical history.  Patient Active Problem List   Diagnosis Date Noted  . CONJUNCTIVITIS, VIRAL, ACUTE 09/29/2008  . VISUAL ACUITY, DECREASED, RIGHT EYE 12/27/2007  . SORE THROAT 12/27/2007  . ASTHMA, UNSPECIFIED 01/17/2007    History reviewed. No pertinent surgical history.  OB History   None      Home Medications    Prior to Admission medications   Medication Sig Start Date End Date Taking? Authorizing Provider  doxycycline (VIBRAMYCIN) 100 MG capsule Take 1 capsule (100 mg total) by mouth 2 (two) times daily. 04/10/17   Deatra Canter, FNP  hydrOXYzine (ATARAX/VISTARIL) 25 MG tablet Take 1 tablet (25 mg total) by mouth every 6 (six) hours. 04/10/17   Deatra Canter, FNP  predniSONE (STERAPRED UNI-PAK 21 TAB) 10 MG (21) TBPK tablet Take by mouth daily. Take as directed. 09/18/17   Mardella Layman, MD  Prenatal Vit-Fe Fumarate-FA (PRENATAL COMPLETE) 14-0.4 MG TABS Take 1 tablet by mouth daily. 08/20/18   Janace Aris, NP    Family History No family history on file.  Social History Social History   Tobacco Use  . Smoking status: Never Smoker  . Smokeless tobacco: Never Used  Substance Use Topics  . Alcohol use: No  . Drug use: No     Allergies   Tramadol and Other   Review of Systems Review of Systems   Physical  Exam Triage Vital Signs ED Triage Vitals  Enc Vitals Group     BP 08/20/18 0832 120/71     Pulse Rate 08/20/18 0832 70     Resp 08/20/18 0832 16     Temp 08/20/18 0832 98.3 F (36.8 C)     Temp Source 08/20/18 0832 Oral     SpO2 08/20/18 0832 100 %     Weight --      Height --      Head Circumference --      Peak Flow --      Pain Score 08/20/18 0836 1     Pain Loc --      Pain Edu? --      Excl. in GC? --    No data found.  Updated Vital Signs BP 120/71 (BP Location: Left Arm)   Pulse 70   Temp 98.3 F (36.8 C) (Oral)   Resp 16   LMP 06/20/2018 (Exact Date)   SpO2 100%   Visual Acuity Right Eye Distance:   Left Eye Distance:   Bilateral Distance:    Right Eye Near:   Left Eye Near:    Bilateral Near:     Physical Exam  Constitutional: She is oriented to person, place, and time. She appears well-developed and well-nourished.  Very pleasant.     HENT:  Head: Normocephalic and atraumatic.  Eyes: Conjunctivae are normal.  Neck: Normal range of motion.  Pulmonary/Chest: Effort normal.  Abdominal: Soft.  Musculoskeletal: Normal range of motion.  Neurological: She is alert and oriented to person, place, and time.  Skin: Skin is warm and dry.  Psychiatric: She has a normal mood and affect. Her behavior is normal. Judgment and thought content normal.  Nursing note and vitals reviewed.    UC Treatments / Results  Labs (all labs ordered are listed, but only abnormal results are displayed) Labs Reviewed  POCT PREGNANCY, URINE - Abnormal; Notable for the following components:      Result Value   Preg Test, Ur POSITIVE (*)    All other components within normal limits    EKG None  Radiology No results found.  Procedures Procedures (including critical care time)  Medications Ordered in UC Medications - No data to display  Initial Impression / Assessment and Plan / UC Course  I have reviewed the triage vital signs and the nursing notes.  Pertinent  labs & imaging results that were available during my care of the patient were reviewed by me and considered in my medical decision making (see chart for details).     Positive pregnancy test. Pt already has established OB/GYN and has an appointment in 2 weeks.  Sent prescription for prenatal vitamins to [facility]. Gave her a list of medications that are safe in pregnancy Final Clinical Impressions(s) / UC Diagnoses   Final diagnoses:  Less than [redacted] weeks gestation of pregnancy     Discharge Instructions     Congratulations on your pregnancy I am prescribing you some prenatal vitamins to go ahead and start We will give you a list of medications that are safe in pregnancy Follow-up with your OB/GYN as planned    ED Prescriptions    Medication Sig Dispense Auth. Provider   Prenatal Vit-Fe Fumarate-FA (PRENATAL COMPLETE) 14-0.4 MG TABS Take 1 tablet by mouth daily. 60 each Janace Aris, NP     Controlled Substance Prescriptions [place] Controlled Substance Registry consulted? Not Applicable   Janace Aris, NP 08/20/18 [phone]

## 2018-09-13 LAB — OB RESULTS CONSOLE HGB/HCT, BLOOD
HEMATOCRIT: 39 (ref 29–41)
HEMOGLOBIN: 13.5

## 2018-09-13 LAB — OB RESULTS CONSOLE RUBELLA ANTIBODY, IGM: Rubella: IMMUNE

## 2018-09-13 LAB — OB RESULTS CONSOLE HEPATITIS B SURFACE ANTIGEN: Hepatitis B Surface Ag: NEGATIVE

## 2018-09-13 LAB — OB RESULTS CONSOLE ANTIBODY SCREEN: Antibody Screen: NEGATIVE

## 2018-09-13 LAB — HIV ANTIBODY (ROUTINE TESTING W REFLEX): HIV SCREEN 4TH GENERATION: NONREACTIVE

## 2018-09-13 LAB — OB RESULTS CONSOLE PLATELET COUNT: PLATELETS: 278

## 2018-09-13 LAB — OB RESULTS CONSOLE ABO/RH: RH Type: POSITIVE

## 2018-09-13 LAB — OB RESULTS CONSOLE RPR: RPR: NONREACTIVE

## 2018-09-17 LAB — OB RESULTS CONSOLE GC/CHLAMYDIA: CHLAMYDIA, DNA PROBE: POSITIVE

## 2018-10-10 ENCOUNTER — Inpatient Hospital Stay (HOSPITAL_COMMUNITY)
Admission: AD | Admit: 2018-10-10 | Discharge: 2018-10-10 | Disposition: A | Discharge status: Home / Self Care | Payer: BLUE CROSS/BLUE SHIELD | Source: Ambulatory Visit | Attending: Obstetrics & Gynecology | Admitting: Obstetrics & Gynecology

## 2018-10-10 ENCOUNTER — Other Ambulatory Visit: Payer: Self-pay

## 2018-10-10 ENCOUNTER — Encounter (HOSPITAL_COMMUNITY): Payer: Self-pay | Admitting: *Deleted

## 2018-10-10 DIAGNOSIS — R102 Pelvic and perineal pain: Secondary | ICD-10-CM | POA: Diagnosis not present

## 2018-10-10 DIAGNOSIS — R1032 Left lower quadrant pain: Secondary | ICD-10-CM | POA: Diagnosis not present

## 2018-10-10 DIAGNOSIS — N949 Unspecified condition associated with female genital organs and menstrual cycle: Secondary | ICD-10-CM

## 2018-10-10 DIAGNOSIS — O26892 Other specified pregnancy related conditions, second trimester: Secondary | ICD-10-CM | POA: Diagnosis not present

## 2018-10-10 DIAGNOSIS — Z3A15 15 weeks gestation of pregnancy: Secondary | ICD-10-CM | POA: Diagnosis not present

## 2018-10-10 HISTORY — DX: Other specified health status: Z78.9

## 2018-10-10 LAB — COMPREHENSIVE METABOLIC PANEL
ALBUMIN: 3.3 g/dL — AB (ref 3.5–5.0)
ALT: 15 U/L (ref 0–44)
ANION GAP: 9 (ref 5–15)
AST: 12 U/L — AB (ref 15–41)
Alkaline Phosphatase: 51 U/L (ref 38–126)
BILIRUBIN TOTAL: 0.5 mg/dL (ref 0.3–1.2)
BUN: 8 mg/dL (ref 6–20)
CHLORIDE: 103 mmol/L (ref 98–111)
CO2: 23 mmol/L (ref 22–32)
Calcium: 8.8 mg/dL — ABNORMAL LOW (ref 8.9–10.3)
Creatinine, Ser: 0.61 mg/dL (ref 0.44–1.00)
GFR calc Af Amer: >60 mL/min (ref >60)
Glucose, Bld: 82 mg/dL (ref 70–99)
POTASSIUM: 3.6 mmol/L (ref 3.5–5.1)
Sodium: 135 mmol/L (ref 135–145)
TOTAL PROTEIN: 7.1 g/dL (ref 6.5–8.1)

## 2018-10-10 LAB — WET PREP, GENITAL
CLUE CELLS WET PREP: NONE SEEN
SPERM: NONE SEEN
Trich, Wet Prep: NONE SEEN
WBC WET PREP: NONE SEEN
YEAST WET PREP: NONE SEEN

## 2018-10-10 LAB — URINALYSIS, ROUTINE W REFLEX MICROSCOPIC
BILIRUBIN URINE: NEGATIVE
Glucose, UA: NEGATIVE mg/dL
HGB URINE DIPSTICK: NEGATIVE
Ketones, ur: NEGATIVE mg/dL
Leukocytes, UA: NEGATIVE
Nitrite: NEGATIVE
PH: 7 (ref 5.0–8.0)
Protein, ur: NEGATIVE mg/dL
SPECIFIC GRAVITY, URINE: 1.012 (ref 1.005–1.030)

## 2018-10-10 LAB — CBC
HCT: 38.1 % (ref 36.0–46.0)
HEMOGLOBIN: 13 g/dL (ref 12.0–15.0)
MCH: 31.3 pg (ref 26.0–34.0)
MCHC: 34.1 g/dL (ref 30.0–36.0)
MCV: 91.6 fL (ref 80.0–100.0)
PLATELETS: 263 10*3/uL (ref 150–400)
RBC: 4.16 MIL/uL (ref 3.87–5.11)
RDW: 12.3 % (ref 11.5–15.5)
WBC: 11 10*3/uL — ABNORMAL HIGH (ref 4.0–10.5)
nRBC: 0 % (ref 0.0–0.2)

## 2018-10-10 NOTE — MAU Note (Signed)
Was having stomach pains this morning, in 1 spot, felt like a flutter at first and then became painful, every 2-3 min. LLQ

## 2018-10-10 NOTE — MAU Provider Note (Signed)
Patient Teresa Harrington is a 22 y.o. G1P0 At 2884w0d here with complaints of LLQ pain that started this morning around 9 am. She denies abnormal discharge, vaginal bleeding, fever, NV, back pain.  History     CSN: [id number]  Arrival date and time: 10/10/18 1633   First Provider Initiated Contact with Patient 10/10/18 1751      Chief Complaint  Patient presents with  . Abdominal Pain   Abdominal Pain  This is a new problem. The current episode started today. The onset quality is sudden. The problem occurs constantly. The problem has been unchanged. The pain is located in the LLQ. The pain is at a severity of 5/10. The quality of the pain is sharp. The abdominal pain does not radiate. Pertinent negatives include no anorexia, fever, nausea or vomiting. Exacerbated by: worse when she stands up and walks around.  She has tried acetaminophen (and nap) for the symptoms.   She called [facility] at [phone] and they recommended tylenol and warm bath; they told her she "could have pulled a ligament that supported her uterus".   Today she had pancakes and ham in the morning, nothing since then. Yesterday she had hamburger and fries; she can't remember what else she had.   Last BM was today at 12; it was normal. Last night she had a BM and it was normal. She denies constipation, straining.   OB History    Gravida  1   Para      Term      Preterm      AB      Living        SAB      TAB      Ectopic      Multiple      Live Births              Past Medical History:  Diagnosis Date  . Medical history non-contributory     Past Surgical History:  Procedure Laterality Date  . NO PAST SURGERIES      No family history on file.  Social History   Tobacco Use  . Smoking status: Never Smoker  . Smokeless tobacco: Never Used  Substance Use Topics  . Alcohol use: No  . Drug use: No    Allergies:  Allergies  Allergen Reactions  . Tramadol Hives  . Other Hives   AGENT: Celery     Medications Prior to Admission  Medication Sig Dispense Refill Last Dose  . Prenatal Vit-Fe Fumarate-FA (PRENATAL COMPLETE) 14-0.4 MG TABS Take 1 tablet by mouth daily. 60 each 1 10/09/2018 at Unknown time  . doxycycline (VIBRAMYCIN) 100 MG capsule Take 1 capsule (100 mg total) by mouth 2 (two) times daily. 20 capsule 0   . hydrOXYzine (ATARAX/VISTARIL) 25 MG tablet Take 1 tablet (25 mg total) by mouth every 6 (six) hours. 24 tablet 0   . predniSONE (STERAPRED UNI-PAK 21 TAB) 10 MG (21) TBPK tablet Take by mouth daily. Take as directed. 21 tablet 0     Review of Systems  Constitutional: Negative for fever.  Eyes: Negative.   Gastrointestinal: Positive for abdominal pain. Negative for anorexia, nausea and vomiting.  Genitourinary: Negative.   Musculoskeletal: Negative.   Neurological: Negative.   Psychiatric/Behavioral: Negative.    Physical Exam   Blood pressure 124/74, pulse 65, temperature 98.4 F (36.9 C), temperature source Oral, resp. rate 16, weight 73 kg, last menstrual period 06/20/2018, SpO2 100 %.  Physical Exam  Constitutional: She appears well-developed.  HENT:  Head: Normocephalic.  Neck: Normal range of motion.  Respiratory: Effort normal.  GI: Soft.  Genitourinary:  Genitourinary Comments: NEFG; no CMT, no suprapubic pain; slightly tender over LLQ and adnexa; but not diffusely tender abdominally, no stool palpated in the LLQ.   Musculoskeletal: Normal range of motion.  Neurological: She is alert.  Skin: Skin is warm and dry.    MAU Course  Procedures  MDM -CBC, CMP, UA, wet prep-normal  -FHR is 152 by Doppler - GC chlamydia pending Patient appears quite comfortable in bed; is using her phone and sitting up.   Assessment and Plan   1. Round ligament pain    2. Patient stable for discharge; long discussion about round ligament pain, causes and management ( yoga, exercises, preventing weight gain).   3. Patient verbalized  understanding; will return to MAU if condition worsens or changes.   Charlesetta Garibaldi Stephinie Battisti 10/10/2018, 5:59 PM

## 2018-10-10 NOTE — Discharge Instructions (Signed)

## 2018-10-11 LAB — GC/CHLAMYDIA PROBE AMP (~~LOC~~) NOT AT ARMC
CHLAMYDIA, DNA PROBE: NEGATIVE
NEISSERIA GONORRHEA: NEGATIVE

## 2018-11-20 NOTE — L&D Delivery Note (Signed)
Operative Delivery Note At 7:44 PM a viable female was delivered via Vaginal, Vacuum Investment banker, operational).  Presentation: vertex; Position: Occiput,, Posterior; Station: +2.  Indication: Deep variables with contractions with progressively slower return to baseline, non-reassuring. Prolonged variables with contractions- present for about 30-40 minutes and vacuum was discussed when variables became deep due to likely need for emergent. At 1930 I informed the patient and support person (FOB) about the need to proceed with VAVD.  Verbal consent: obtained from patient.  Risks and benefits discussed in detail.  Risks include, but are not limited to the risks of anesthesia, bleeding, infection, damage to maternal tissues, fetal cephalhematoma.  There is also the risk of inability to effect vaginal delivery of the head, or shoulder dystocia that cannot be resolved by established maneuvers, leading to the need for emergency cesarean section.  APGAR: 5, 7; weight-pending .   Placenta status: Spontaneous, intact. Sent to pathology Cord:  3 VC   Patient was examined and found to be fully dilated with fetal station of +2. The soft vacuum soft cup MightyVac was positioned over the sagittal suture 3 cm anterior to posterior fontanelle.  Pressure was then increased to 500 mmHg, and the patient was instructed to push.  Pulling was administered along the pelvic curve. 5 pull was administered during 5 contractions, 0 popoffs.  Infant head was delivered. I identified a nuchal cord x1, delivered through nuchal. Thick meconium.  Cord pH: 7.044  Anesthesia:  Epidural Instruments: 10,count correct Episiotomy: None Lacerations: None Suture Repair: None Est. Blood Loss (mL): 100  Mom to postpartum.  Baby to Couplet care / Skin to Skin.  Isa Rankin [facility] 04/01/2019, 8:39 PM

## 2018-12-17 ENCOUNTER — Ambulatory Visit (INDEPENDENT_AMBULATORY_CARE_PROVIDER_SITE_OTHER): Payer: BLUE CROSS/BLUE SHIELD | Admitting: Student

## 2018-12-17 ENCOUNTER — Encounter: Payer: Self-pay | Admitting: Student

## 2018-12-17 DIAGNOSIS — Z3402 Encounter for supervision of normal first pregnancy, second trimester: Secondary | ICD-10-CM | POA: Insufficient documentation

## 2018-12-17 MED ORDER — COMFORT FIT MATERNITY SUPP SM MISC: 1.0000 [IU] | Freq: Every day | 0 refills | Status: DC | PRN

## 2018-12-17 NOTE — Patient Instructions (Addendum)
PREGNANCY SUPPORT BELT: You are not alone, Seventy-five percent of women have some sort of abdominal or back pain at some point in their pregnancy. Your baby is growing at a fast pace, which means that your whole body is rapidly trying to adjust to the changes. As your uterus grows, your back may start feeling a bit under stress and this can result in back or abdominal pain that can go from mild, and therefore bearable, to severe pains that will not allow you to sit or lay down comfortably, When it comes to dealing with pregnancy-related pains and cramps, some pregnant women usually prefer natural remedies, which the market is filled with nowadays. For example, wearing a pregnancy support belt can help ease and lessen your discomfort and pain. WHAT ARE THE BENEFITS OF WEARING A PREGNANCY SUPPORT BELT? A pregnancy support belt provides support to the lower portion of the belly taking some of the weight of the growing uterus and distributing to the other parts of your body. It is designed make you comfortable and gives you extra support. Over the years, the pregnancy apparel market has been studying the needs and wants of pregnant women and they have come up with the most comfortable pregnancy support belts that woman could ever ask for. In fact, you will no longer have to wear a stretched-out or bulky pregnancy belt that is visible underneath your clothes and makes you feel even more uncomfortable. Nowadays, a pregnancy support belt is made of comfortable and stretchy materials that will not irritate your skin but will actually make you feel at ease and you will not even notice you are wearing it. They are easy to put on and adjust during the day and can be worn at night for additional support.  BENEFITS: . Relives Back pain . Relieves Abdominal Muscle and Leg Pain . Stabilizes the Pelvic Ring . Offers a Cushioned Abdominal Lift Pad . Relieves pressure on the Sciatic Nerve Within Minutes WHERE TO GET  YOUR PREGNANCY BELT: [employer] [phone] @[address]  [place], [place] [address]       Childbirth Education Options: [facility] Department Classes:  Childbirth education classes can help you get ready for a positive parenting experience. You can also meet other expectant parents and get free stuff for your baby. Each class runs for five weeks on the same night and costs $45 for the mother-to-be and her support person. Medicaid covers the cost if you are eligible. Call [phone] to register. [facility] Childbirth Education:  [phone] or [phone] or [email or web address]  Baby & Me Class: Discuss newborn & infant parenting and family adjustment issues with other new mothers in a relaxed environment. Each week brings a new speaker or baby-centered activity. We encourage new mothers to join [place] every Thursday at 11:00am. Babies birth until crawling. No registration or fee. Daddy [employer]: This course offers Dads-to-be the tools and knowledge needed to feel confident on their journey to becoming new fathers. Experienced dads, who have been trained as coaches, teach dads-to-be how to hold, comfort, diaper, swaddle and play with their infant while being able to support the new mom as well. A class for men taught by men. $25/dad Big Brother/Big Sister: Let your children share in the joy of a new brother or sister in this special class designed just for them. Class includes discussion about how families care for babies: swaddling, holding, diapering, safety as well as how they can be helpful in their new  role. This class is designed for children ages 2 to 38, but any age is welcome. Please register each child individually. $5/child  Mom Talk: This mom-led group offers support and connection to mothers as they journey through the adjustments and struggles of that sometimes overwhelming first year after the birth of a child. Tuesdays at 10:00am and  Thursdays at 6:00pm. Babies welcome. No registration or fee. Breastfeeding Support Group: This group is a mother-to-mother support circle where moms have the opportunity to share their breastfeeding experiences. A Lactation Consultant is present for questions and concerns. Meets each Tuesday at 11:00am. No fee or registration. Breastfeeding Your Baby: Learn what to expect in the first days of breastfeeding your newborn.  This class will help you feel more confident with the skills needed to begin your breastfeeding experience. Many new mothers are concerned about breastfeeding after leaving the hospital. This class will also address the most common fears and challenges about breastfeeding during the first few weeks, months and beyond. (call for fee) Comfort Techniques and Tour: This 2 hour interactive class will provide you the opportunity to learn & practice hands-on techniques that can help relieve some of the discomfort of labor and encourage your baby to rotate toward the best position for birth. You and your partner will be able to try a variety of labor positions with birth balls and rebozos as well as practice breathing, relaxation, and visualization techniques. A tour of the [facility] is included with this class. $20 per registrant and support person Childbirth Class- Weekend Option: This class is a Weekend version of our Birth & Baby series. It is designed for parents who have a difficult time fitting several weeks of classes into their schedule. It covers the care of your newborn and the basics of labor and childbirth. It also includes a [place] [facility] and lunch. The class is held two consecutive days: beginning on Friday evening from 6:30 - 8:30 p.m. and the next day, Saturday from 9 a.m. - 4 p.m. (call for fee) Doren Custard Class: Interested in a waterbirth?  This informational class will help you discover whether waterbirth is the right fit  for you. Education about waterbirth itself, supplies you would need and how to assemble your support team is what you can expect from this class. Some obstetrical practices require this class in order to pursue a waterbirth. (Not all obstetrical practices offer waterbirth-check with your healthcare provider.) Register only the expectant mom, but you are encouraged to bring your partner to class! Required if planning waterbirth, no fee. Infant/Child CPR: Parents, grandparents, babysitters, and friends learn Cardio-Pulmonary Resuscitation skills for infants and children. You will also learn how to treat both conscious and unconscious choking in infants and children. This Family & Friends program does not offer certification. Register each participant individually to ensure that enough mannequins are available. (Call for fee) Grandparent Love: Expecting a grandbaby? This class is for you! Learn about the latest infant care and safety recommendations and ways to support your own child as he or she transitions into the parenting role. Taught by Registered Nurses who are childbirth instructors, but most importantly...they are grandmothers too! $10/person. Childbirth Class- Natural Childbirth: This series of 5 weekly classes is for expectant parents who want to learn and practice natural methods of coping with the process of labor and childbirth. Relaxation, breathing, massage, visualization, role of the partner, and helpful positioning are highlighted. Participants learn how to be confident in their  body's ability to give birth. This class will empower and help parents make informed decisions about their own care. Includes discussion that will help new parents transition into the immediate postpartum period. [facility] is included. We suggest taking this class between 25-32 weeks, but it's only a recommendation. $75 per registrant and one support person or $30 Medicaid. Childbirth  Class- 3 week Series: This option of 3 weekly classes helps you and your labor partner prepare for childbirth. Newborn care, labor & birth, cesarean birth, pain management, and comfort techniques are discussed and a [place] [facility] is included. The class meets at the same time, on the same day of the week for 3 consecutive weeks beginning with the starting date you choose. $60 for registrant and one support person.  Marvelous Multiples: Expecting twins, triplets, or more? This class covers the differences in labor, birth, parenting, and breastfeeding issues that face multiples' parents. NICU tour is included. Led by a Certified Childbirth Educator who is the mother of twins. No fee. Caring for Baby: This class is for expectant and adoptive parents who want to learn and practice the most up-to-date newborn care for their babies. Focus is on birth through the first six weeks of life. Topics include feeding, bathing, diapering, crying, umbilical cord care, circumcision care and safe sleep. Parents learn to recognize symptoms of illness and when to call the pediatrician. Register only the mom-to-be and your partner or support person can plan to come with you! $10 per registrant and support person Childbirth Class- online option: This online class offers you the freedom to complete a Birth and Baby series in the comfort of your own home. The flexibility of this option allows you to review sections at your own pace, at times convenient to you and your support people. It includes additional video information, animations, quizzes, and extended activities. Get organized with helpful eClass tools, checklists, and trackers. Once you register online for the class, you will receive an email within a few days to accept the invitation and begin the class when the time is right for you. The content will be available to you for 60 days. $60 for 60 days of online access for you and your support  people.  Local Doulas: Natural Baby Doulas [email or web address] Tel: [phone] [email or web address] [employer] [phone] [email or web address] The Labor Hassell Halim  (also do waterbirth tub rental) [phone] [email or web address] [email or web address] Birth Doula [phone] [email or web address] [place] [email or web address]/ [employer] (PADA) [email or web address] [place] Birth and Baby  [email or web address] Considering Waterbirth? Guide for patients at [facility] [employer]  Why consider waterbirth?  . Gentle birth for babies . Less pain medicine used in labor . May allow for passive descent/less pushing . May reduce perineal tears  . More mobility and instinctive maternal position changes . Increased maternal relaxation . Reduced blood pressure in labor  Is waterbirth safe? What are the risks of infection, drowning or other complications?  . Infection: o Very low risk (3.7 % for tub vs 4.8% for bed) o 7 in 8000 waterbirths with documented infection o Poorly cleaned equipment most common cause o Slightly lower group B strep transmission rate  . Drowning o Maternal:  - Very low risk   - Related to seizures or fainting o Newborn:  - Very low risk. No evidence of increased risk of respiratory problems in multiple large studies - Physiological protection from breathing under water - Avoid underwater  birth if there are any fetal complications - Once baby's head is out of the water, keep it out.  . Birth complication o Some reports of cord trauma, but risk decreased by bringing baby to surface gradually o No evidence of increased risk of shoulder dystocia. Mothers can usually change positions faster in water than in a bed, possibly aiding the  maneuvers to free the shoulder.   You must attend a Doren Custard class at [facility]  3rd Wednesday of every month from 7-9pm  [employer] by calling [phone] or online at [email or web address]  Bring [place] the certificate from the class to your prenatal appointment  Meet with a midwife at 36 weeks to see if you can still plan a waterbirth and to sign the consent.   Purchase or rent the following supplies:   Water Birth Pool (Birth Pool in a Box or [place] for instance)  (Tubs start ~$125)  Single-use disposable tub liner designed for your brand of tub  New garden hose labeled "lead-free", "suitable for drinking water",  Electric drain pump to remove water (We recommend 792 gallon per hour or greater pump.)   Separate garden hose to remove the dirty water  Fish net  Bathing suit top (optional)  Long-handled mirror (optional)  Places to purchase or rent supplies  GotWebTools.is for tub purchases and supplies  Waterbirthsolutions.com for tub purchases and supplies  The Labor Ladies (www.[email or web address]) $275 for tub rental/set-up & take down/kit   [employer] (http://www.fleming.com/.htm) Information regarding doulas (labor support) who provide pool rentals  Our practice has a Birth Pool in a Box tub at the hospital that you may borrow on a first-come-first-served basis. It is your responsibility to to set up, clean and break down the tub. We cannot guarantee the availability of this tub in advance. You are responsible for bringing all accessories listed above. If you do not have all necessary supplies you cannot have a waterbirth.    Things that would prevent you from having a waterbirth:  Premature, <37wks  Previous cesarean birth  Presence of thick meconium-stained fluid  Multiple gestation (Twins, triplets, etc.)  Uncontrolled diabetes or gestational diabetes requiring medication  Hypertension requiring medication  or diagnosis of pre-eclampsia  Heavy vaginal bleeding  Non-reassuring fetal heart rate  Active infection (MRSA, etc.). Group B Strep is NOT a contraindication for  waterbirth.  If your labor has to be induced and induction method requires continuous  monitoring of the baby's heart rate  Other risks/issues identified by your obstetrical provider  Please remember that birth is unpredictable. Under certain unforeseeable circumstances your provider may advise against giving birth in the tub. These decisions will be made on a case-by-case basis and with the safety of you and your baby as our highest priority.         Common Medications Safe in Pregnancy  Acne:      Constipation:  Benzoyl Peroxide     Colace  Clindamycin      Dulcolax Suppository  Topica Erythromycin     Fibercon  Salicylic Acid      Metamucil         Miralax AVOID:        Senakot   Accutane    Cough:  Retin-A       Cough Drops  Tetracycline      Phenergan w/ Codeine if Rx  Minocycline      Robitussin (Plain & DM)  Antibiotics:     Crabs/Lice:  Ceclor  RID  Cephalosporins    AVOID:  E-Mycins      Kwell  Keflex  Macrobid/Macrodantin   Diarrhea:  Penicillin      Kao-Pectate  Zithromax      Imodium AD         PUSH FLUIDS AVOID:       Cipro     Fever:  Tetracycline      Tylenol (Regular or Extra  Minocycline       Strength)  Levaquin      Extra Strength-Do not          Exceed 8 tabs/24 hrs Caffeine:        <247m/day (equiv. To 1 cup of coffee or  approx. 3 12 oz sodas)         Gas: Cold/Hayfever:       Gas-X  Benadryl      Mylicon  Claritin       Phazyme  **Claritin-D        Chlor-Trimeton    Headaches:  Dimetapp      ASA-Free Excedrin  Drixoral-Non-Drowsy     Cold Compress  Mucinex (Guaifenasin)     Tylenol (Regular or Extra  Sudafed/Sudafed-12 Hour     Strength)  **Sudafed PE Pseudoephedrine   Tylenol Cold & Sinus     Vicks Vapor Rub  Zyrtec  **AVOID if Problems With Blood  Pressure         Heartburn: Avoid lying down for at least 1 hour after meals  Aciphex      Maalox     Rash:  Milk of Magnesia     Benadryl    Mylanta       1% Hydrocortisone Cream  Pepcid  Pepcid Complete   Sleep Aids:  Prevacid      Ambien   Prilosec       Benadryl  Rolaids       Chamomile Tea  Tums (Limit 4/day)     Unisom  Zantac       Tylenol PM         Warm milk-add vanilla or  Hemorrhoids:       Sugar for taste  Anusol/Anusol H.C.  (RX: Analapram 2.5%)  Sugar Substitutes:  Hydrocortisone OTC     Ok in moderation  Preparation H      Tucks        Vaseline lotion applied to tissue with wiping    Herpes:     Throat:  Acyclovir      Oragel  Famvir  Valtrex     Vaccines:         Flu Shot Leg Cramps:       *Gardasil  Benadryl      Hepatitis A         Hepatitis B Nasal Spray:       Pneumovax  Saline Nasal Spray     Polio Booster         Tetanus Nausea:       Tuberculosis test or PPD  Vitamin B6 25 mg TID   AVOID:    Dramamine      *Gardasil  Emetrol       Live Poliovirus  Ginger Root 250 mg QID    MMR (measles, mumps &  High Complex Carbs @ Bedtime    rebella)  Sea Bands-Accupressure    Varicella (Chickenpox)  Unisom 1/2 tab TID     *No known complications           If  received before Pain:         Known pregnancy;   Darvocet       Resume series after  Lortab        Delivery  Percocet    Yeast:   Tramadol      Femstat  Tylenol 3      Gyne-lotrimin  Ultram       Monistat  Vicodin           MISC:         All Sunscreens           Hair Coloring/highlights          Insect Repellant's          (Including DEET)         Mystic Tans         AREA PEDIATRIC/FAMILY [place] [address], [address] [place], [place]  [address] Phone - [phone]   Fax - [phone]  [facility] [place] [address] [place], [place] [address] Phone - [phone]   Fax - [place] [address], [place]  [address] Phone - [phone]   Fax - [phone]  [place]. [address], [place] [address] [place], [place]  [address] Phone - [phone]   Fax - [phone]  [place] [address] [place], [place]  [address] Phone - [phone]   Fax - [phone]  [facility] [address] [place], [place]  [address] Phone - [phone]   Fax - [place] [address] [place], [place]  [address] Phone - [phone]   Fax - [phone]  [place] [address] [place], [place] [address] [place], [place]  [address] Phone - [phone]   Fax - [place] [address] [place], [place]  [address] Phone - [phone]   Fax - [phone] [facility] [place]. [address] [place], [place]  [address] Phone - [phone]   Fax - [phone]  [place] [address] [place], [place]  [address] Phone - [phone]   Fax - [phone]  [facility] AT [place], [place], [place]  [address] Phone - [phone]   Fax - [place] [address], [place], [place]  [address] Phone - [phone]   Fax - [phone]  [facility] [address], [place], [place]  [address] Phone - [place], [place]  [address] Phone - [phone]   Fax - [place] [address] [place], [place]  [address] Phone - [phone]   Fax - [phone]  [place] [address] [place], [place]  [address] Phone - [phone]   Fax - [place]. [place], [place]  [address] Phone - [phone]   Fax -  [place], [place], [place]  [address] Phone - [phone]   Fax - [place] [address] [place], [place]  [address] Phone - [phone]   Fax - [phone]  [place] [address] [place], [place]  [address] Phone - [phone]   Fax -  [place] [address], [place], [place]  [address] Phone - [phone]   Fax - [phone]  [place] [address] [place], [place]  [address] Phone - [phone]   Fax - [phone] [place] [address], [place]  [address] Phone - [phone]   Fax - [place] [address] [place], [place]  [address] Phone - [phone]   Fax - [place] [address], [place], [place]  [address] Phone - [phone]   Fax - [phone]  Finney MD [address] [place] [address] Phone [phone]  Fax [phone]

## 2018-12-17 NOTE — Progress Notes (Signed)
Subjective:   Teresa Harrington is a 23 y.o. G1P0 at [redacted]w[redacted]d by early ultrasound being seen today for her first obstetrical visit.  She is a transfer from [facility]. Her last appointment with them was 11/06/18. Her obstetrical history is significant for none. Patient does intend to breast feed. Pregnancy history fully reviewed.  Patient reports no complaints.  HISTORY: OB History  Gravida Para Term Preterm AB Living  1 0 0 0 0 0  SAB TAB Ectopic Multiple Live Births  0 0 0 0 0    # Outcome Date GA Lbr Len/2nd Weight Sex Delivery Anes PTL Lv  1 Current            Past Medical History:  Diagnosis Date  . Medical history non-contributory    Past Surgical History:  Procedure Laterality Date  . NO PAST SURGERIES     Family History  Problem Relation Age of Onset  . Healthy Mother   . Healthy Father    Social History   Tobacco Use  . Smoking status: Never Smoker  . Smokeless tobacco: Never Used  Substance Use Topics  . Alcohol use: No  . Drug use: No   Allergies  Allergen Reactions  . Tramadol Hives  . Other Hives    AGENT: [facility]    Current Outpatient Medications on File Prior to Visit  Medication Sig Dispense Refill  . Prenatal Vit-Fe Fumarate-FA (PRENATAL COMPLETE) 14-0.4 MG TABS Take 1 tablet by mouth daily. 60 each 1  . azithromycin (ZITHROMAX) 1 g powder     . azithromycin (ZITHROMAX) 1 g powder MIX 1 PACKET IN LIQUID AND DRINK ONCE  0  . doxycycline (VIBRAMYCIN) 100 MG capsule Take 1 capsule (100 mg total) by mouth 2 (two) times daily. (Patient not taking: Reported on 12/17/2018) 20 capsule 0  . hydrOXYzine (ATARAX/VISTARIL) 25 MG tablet Take 1 tablet (25 mg total) by mouth every 6 (six) hours. (Patient not taking: Reported on 12/17/2018) 24 tablet 0   No current facility-administered medications on file prior to visit.     Exam   BP 119/72   Pulse 74   Wt 169 lb 14.4 oz (77.1 kg)   LMP 06/20/2018 (Exact Date)    Uterus:   Fundal height 26 cm   System: General: well-developed, well-nourished female in no acute distress   Skin: normal coloration and turgor, no rashes   Neurologic: oriented, normal, negative, normal mood   Extremities: normal strength, tone, and muscle mass, ROM of all joints is normal   HEENT PERRLA, extraocular movement intact and sclera clear, anicteric   Mouth/Teeth mucous membranes moist, pharynx normal without lesions and dental hygiene good   Neck supple and no masses   Cardiovascular: regular rate and rhythm   Respiratory:  no respiratory distress, normal breath sounds   Abdomen: soft, non-tender; bowel sounds normal; no masses,  no organomegaly     Assessment:   Pregnancy: G1P0 Patient Active Problem List   Diagnosis Date Noted  . Encounter for supervision of normal first pregnancy in second trimester 12/17/2018  . CONJUNCTIVITIS, VIRAL, ACUTE 09/29/2008  . VISUAL ACUITY, DECREASED, RIGHT EYE 12/27/2007  . SORE THROAT 12/27/2007  . ASTHMA, UNSPECIFIED 01/17/2007     Plan:  1. Encounter for supervision of normal first pregnancy in second trimester -patient given childbirth education & pediatrician hand out - Elastic Bandages & Supports (COMFORT FIT MATERNITY SUPP SM) MISC; 1 Units by Does not apply route daily as needed.  Dispense:  1 each; Refill: 0 - [place] MFM OB COMP + 14 WK; Future   Initial labs reviewed per prenatal record Continue prenatal vitamins. Genetic Screening discussed, First trimester screen: results reviewed. Ultrasound discussed; fetal anatomic survey: ordered. Per [facility] rep, pt did not have anatomy ultrasound done there Problem list reviewed and updated. The nature of [facility] with multiple MDs and other Advanced Practice Providers was explained to patient; also emphasized that residents, students are part of our team. Routine obstetric precautions reviewed. Return in about 3 weeks (around 01/07/2019) for Routine OB & fasting  labs.   Judeth Horn 12:15 PM 12/17/18

## 2018-12-19 ENCOUNTER — Ambulatory Visit (HOSPITAL_COMMUNITY)
Admission: RE | Admit: 2018-12-19 | Discharge: 2018-12-19 | Disposition: A | Discharge status: Home / Self Care | Payer: BLUE CROSS/BLUE SHIELD | Source: Ambulatory Visit | Attending: Student | Admitting: Student

## 2018-12-19 ENCOUNTER — Other Ambulatory Visit (HOSPITAL_COMMUNITY): Payer: Self-pay | Admitting: *Deleted

## 2018-12-19 DIAGNOSIS — Z3402 Encounter for supervision of normal first pregnancy, second trimester: Secondary | ICD-10-CM | POA: Insufficient documentation

## 2018-12-19 DIAGNOSIS — Z3A25 25 weeks gestation of pregnancy: Secondary | ICD-10-CM

## 2018-12-19 DIAGNOSIS — Z362 Encounter for other antenatal screening follow-up: Secondary | ICD-10-CM

## 2018-12-19 DIAGNOSIS — Z363 Encounter for antenatal screening for malformations: Secondary | ICD-10-CM | POA: Diagnosis not present

## 2018-12-23 ENCOUNTER — Inpatient Hospital Stay (HOSPITAL_COMMUNITY)
Admission: AD | Admit: 2018-12-23 | Discharge: 2018-12-23 | Disposition: A | Discharge status: Home / Self Care | Payer: BLUE CROSS/BLUE SHIELD | Attending: Obstetrics and Gynecology | Admitting: Obstetrics and Gynecology

## 2018-12-23 ENCOUNTER — Encounter (HOSPITAL_COMMUNITY): Payer: Self-pay

## 2018-12-23 ENCOUNTER — Encounter: Payer: Self-pay | Admitting: *Deleted

## 2018-12-23 DIAGNOSIS — O26892 Other specified pregnancy related conditions, second trimester: Secondary | ICD-10-CM | POA: Insufficient documentation

## 2018-12-23 DIAGNOSIS — O212 Late vomiting of pregnancy: Secondary | ICD-10-CM | POA: Insufficient documentation

## 2018-12-23 DIAGNOSIS — Z3A25 25 weeks gestation of pregnancy: Secondary | ICD-10-CM | POA: Diagnosis not present

## 2018-12-23 DIAGNOSIS — R079 Chest pain, unspecified: Secondary | ICD-10-CM | POA: Diagnosis present

## 2018-12-23 DIAGNOSIS — R0789 Other chest pain: Secondary | ICD-10-CM | POA: Insufficient documentation

## 2018-12-23 DIAGNOSIS — R110 Nausea: Secondary | ICD-10-CM | POA: Diagnosis not present

## 2018-12-23 DIAGNOSIS — O26899 Other specified pregnancy related conditions, unspecified trimester: Secondary | ICD-10-CM

## 2018-12-23 MED ORDER — ONDANSETRON 4 MG PO TBDP: 4.0000 mg | ORAL_TABLET | Freq: Three times a day (TID) | ORAL | 0 refills | Status: DC | PRN

## 2018-12-23 MED ORDER — ACETAMINOPHEN 500 MG PO TABS
1000.0000 mg | ORAL_TABLET | Freq: Once | ORAL | Status: AC
  Administered 2018-12-23: 1000 mg via ORAL
  Filled 2018-12-23: qty 2

## 2018-12-23 NOTE — MAU Note (Signed)
Pt c/o sharp, left-sided pain in chest when inhaling. Has had this happen before. Thinks it might have been related anxiety.  Rating pain 5/10.  Denies being short of breath.  Also having loss of appetite and N/V.  Denies LOF or bleeding. +FM.

## 2018-12-23 NOTE — Discharge Instructions (Signed)
Chest Wall Pain Chest wall pain is pain in or around the bones and muscles of your chest. Sometimes, an injury causes this pain. Excessive coughing or overuse of arm and chest muscles may also cause chest wall pain. Sometimes, the cause may not be known. This pain may take several weeks or longer to get better. Follow these instructions at home: Managing pain, stiffness, and swelling   If directed, put ice on the painful area: ? Put ice in a plastic bag. ? Place a towel between your skin and the bag. ? Leave the ice on for 20 minutes, 2-3 times per day. Activity  Rest as told by your health care provider.  Avoid activities that cause pain. These include any activities that use your chest muscles or your abdominal and side muscles to lift heavy items. Ask your health care provider what activities are safe for you. General instructions   Take over-the-counter and prescription medicines only as told by your health care provider.  Do not use any products that contain nicotine or tobacco, such as cigarettes, e-cigarettes, and chewing tobacco. These can delay healing after injury. If you need help quitting, ask your health care provider.  Keep all follow-up visits as told by your health care provider. This is important. Contact a health care provider if:  You have a fever.  Your chest pain becomes worse.  You have new symptoms. Get help right away if:  You feel sweaty or light-headed.  You have a cough with mucus from your lungs (sputum) or you cough up blood.  You develop shortness of breath. These symptoms may represent a serious problem that is an emergency. Do not wait to see if the symptoms will go away. Get medical help right away. Call your local emergency services (911 in the [place] Do not drive yourself to the hospital. Summary  Chest wall pain is pain in or around the bones and muscles of your chest.  Depending on the cause, it may be treated with ice, rest, medicines,  and avoiding activities that cause pain.  Contact a health care provider if you have a fever, worsening chest pain, or new symptoms.  Get help right away if you feel light-headed or you develop shortness of breath. These symptoms may be an emergency. This information is not intended to replace advice given to you by your health care provider. Make sure you discuss any questions you have with your health care provider. Document Released: 11/06/2005 Document Revised: 05/09/2018 Document Reviewed: 05/09/2018 Elsevier Interactive Patient Education  2019 [employer].

## 2018-12-23 NOTE — [facility]U Provider Note (Signed)
Chief Complaint:  Chest Pain   First Provider Initiated Contact with Patient 12/23/18 2219     HPI: Teresa Harrington is a 23 y.o. G1P0 at [redacted]w[redacted]d who presents to maternity admissions reporting chest pain & n/v. Symptoms began this morning with n/v. Vomited after eating her breakfast & her lunch. Currently not nauseated. Denies heartburn or diarrhea.  Since this evening has had pain in left upper chest. Pain worse with inhalation. Hasn't treated symptoms. Denies SOB, cough, or palpitations. States feels similar to previous symptoms of anxiety. Last anxiety episode was a few weeks ago. Reports that she has never been prescribed anything for her anxiety.   Denies contractions, leakage of fluid or vaginal bleeding. Good fetal movement.  Location: left upper chest Quality: sharp Severity: 5/10 in pain scale Duration: <1 day Timing: intermittent Modifying factors: worse with breathing Associated signs and symptoms: n/v  Past Medical History:  Diagnosis Date  . Medical history non-contributory    OB History  Gravida Para Term Preterm AB Living  1            SAB TAB Ectopic Multiple Live Births               # Outcome Date GA Lbr Len/2nd Weight Sex Delivery Anes PTL Lv  1 Current            Past Surgical History:  Procedure Laterality Date  . NO PAST SURGERIES     Family History  Problem Relation Age of Onset  . Healthy Mother   . Healthy Father    Social History   Tobacco Use  . Smoking status: Never Smoker  . Smokeless tobacco: Never Used  Substance Use Topics  . Alcohol use: No  . Drug use: No   Allergies  Allergen Reactions  . Tramadol Hives  . Other Hives    AGENT: Celery    No medications prior to admission.    I have reviewed patient's Past Medical Hx, Surgical Hx, Family Hx, Social Hx, medications and allergies.   ROS:  Review of Systems  Constitutional: Negative.   Respiratory: Negative.   Cardiovascular: Positive for chest pain. Negative for palpitations  and leg swelling.  Gastrointestinal: Positive for nausea (currently not nauseated) and vomiting. Negative for abdominal pain.  Genitourinary: Negative.   Neurological: Negative for dizziness and headaches.    Physical Exam   Patient Vitals for the past 24 hrs:  BP Temp Temp src Pulse Resp SpO2 Height Weight  12/23/18 2321 133/72 - - - - - - -  12/23/18 2313 133/72 - - 84 - - - -  12/23/18 2310 - - - - - 100 % - -  12/23/18 2305 - - - - - 99 % - -  12/23/18 2300 - - - - - 100 % - -  12/23/18 2255 - - - - - 98 % - -  12/23/18 2250 - - - - - 100 % - -  12/23/18 2245 - - - - - 100 % - -  12/23/18 2240 - - - - - 100 % - -  12/23/18 2235 - - - - - 99 % - -  12/23/18 2230 - - - - - 100 % - -  12/23/18 2225 - - - - - 100 % - -  12/23/18 2220 - - - - - 100 % - -  12/23/18 2215 - - - - - 100 % - -  12/23/18 2210 - - - - - 100 % - -  12/23/18 2205 - - - - - 100 % - -  12/23/18 2200 - - - - - 100 % - -  12/23/18 2155 - - - - - 100 % - -  12/23/18 2150 - - - - - 100 % - -  12/23/18 2145 - - - - - 99 % - -  12/23/18 2141 130/66 - - 82 - - - -  12/23/18 2140 - - - - - 99 % - -  12/23/18 2120 139/65 98.5 F (36.9 C) Oral 90 18 100 % 5\' 5"  (1.651 m) 78.1 kg    Constitutional: Well-developed, well-nourished female in no acute distress.  Cardiovascular: normal rate & rhythm, no murmur. TTP in left upper chest Respiratory: normal effort, lung sounds clear throughout GI: Abd soft, non-tender, gravid appropriate for gestational age. Pos BS x 4 MS: Extremities nontender, no edema, normal ROM Neurologic: Alert and oriented x 4.   NST:  Baseline: 145 bpm, Variability: Good {> 6 bpm), Accelerations: Non-reactive but appropriate for gestational age and Decelerations: Absent   Labs: No results found for this or any previous visit (from the past 24 hour(s)).  Imaging:  No results found.  MAU Course: Orders Placed This Encounter  Procedures  . ED EKG  . Discharge patient   Meds ordered  this encounter  Medications  . acetaminophen (TYLENOL) tablet 1,000 mg  . ondansetron (ZOFRAN ODT) 4 MG disintegrating tablet    Sig: Take 1 tablet (4 mg total) by mouth every 8 (eight) hours as needed for nausea or vomiting.    Dispense:  15 tablet    Refill:  0    Order Specific Question:   Supervising Provider    Answer:   Nettie Elm L [[id number]]    MDM: Patient with left upper chest pain. Pain reproducible with palpation. Vital signs look good & SpO2 99-100%. EKG normal. Heart sounds normal & breath sounds clear throughout. Offered patient medication for anxiety, she declines. Complete resolution of pain after tylenol.  Rx zofran per patient request.   Assessment: 1. Other chest pain   2. [redacted] weeks gestation of pregnancy   3. Pregnancy related nausea, antepartum     Plan: Discharge home in stable condition.  Discussed reasons to return to MAU   Allergies as of 12/23/2018      Reactions   Tramadol Hives   Other Hives   AGENT: Celery       Medication List    STOP taking these medications   azithromycin 1 g powder Commonly known as:  ZITHROMAX   doxycycline 100 MG capsule Commonly known as:  VIBRAMYCIN   hydrOXYzine 25 MG tablet Commonly known as:  ATARAX/VISTARIL     TAKE these medications   COMFORT FIT MATERNITY SUPP SM Misc 1 Units by Does not apply route daily as needed.   ondansetron 4 MG disintegrating tablet Commonly known as:  ZOFRAN ODT Take 1 tablet (4 mg total) by mouth every 8 (eight) hours as needed for nausea or vomiting.   PRENATAL COMPLETE 14-0.4 MG Tabs Take 1 tablet by mouth daily.       Judeth Horn, NP 12/24/2018 2:48 AM

## 2019-01-07 ENCOUNTER — Other Ambulatory Visit: Payer: BLUE CROSS/BLUE SHIELD

## 2019-01-07 ENCOUNTER — Ambulatory Visit (INDEPENDENT_AMBULATORY_CARE_PROVIDER_SITE_OTHER): Payer: BLUE CROSS/BLUE SHIELD

## 2019-01-07 ENCOUNTER — Other Ambulatory Visit: Payer: Self-pay

## 2019-01-07 ENCOUNTER — Encounter: Payer: Self-pay | Admitting: Emergency Medicine

## 2019-01-07 VITALS — BP 116/64 | HR 86 | Wt 172.8 lb

## 2019-01-07 DIAGNOSIS — Z3A27 27 weeks gestation of pregnancy: Secondary | ICD-10-CM

## 2019-01-07 DIAGNOSIS — Z3402 Encounter for supervision of normal first pregnancy, second trimester: Secondary | ICD-10-CM

## 2019-01-07 DIAGNOSIS — Z23 Encounter for immunization: Secondary | ICD-10-CM | POA: Diagnosis not present

## 2019-01-07 NOTE — Patient Instructions (Addendum)
Third Trimester of Pregnancy The third trimester is from week 28 through week 40 (months 7 through 9). The third trimester is a time when the unborn baby (fetus) is growing rapidly. At the end of the ninth month, the fetus is about 20 inches in length and weighs 6-10 pounds. Body changes during your third trimester Your body will continue to go through many changes during pregnancy. The changes vary from woman to woman. During the third trimester:  Your weight will continue to increase. You can expect to gain 25-35 pounds (11-16 kg) by the end of the pregnancy.  You may begin to get stretch marks on your hips, abdomen, and breasts.  You may urinate more often because the fetus is moving lower into your pelvis and pressing on your bladder.  You may develop or continue to have heartburn. This is caused by increased hormones that slow down muscles in the digestive tract.  You may develop or continue to have constipation because increased hormones slow digestion and cause the muscles that push waste through your intestines to relax.  You may develop hemorrhoids. These are swollen veins (varicose veins) in the rectum that can itch or be painful.  You may develop swollen, bulging veins (varicose veins) in your legs.  You may have increased body aches in the pelvis, back, or thighs. This is due to weight gain and increased hormones that are relaxing your joints.  You may have changes in your hair. These can include thickening of your hair, rapid growth, and changes in texture. Some women also have hair loss during or after pregnancy, or hair that feels dry or thin. Your hair will most likely return to normal after your baby is born.  Your breasts will continue to grow and they will continue to become tender. A yellow fluid (colostrum) may leak from your breasts. This is the first milk you are producing for your baby.  Your belly button may stick out.  You may notice more swelling in your hands,  face, or ankles.  You may have increased tingling or numbness in your hands, arms, and legs. The skin on your belly may also feel numb.  You may feel short of breath because of your expanding uterus.  You may have more problems sleeping. This can be caused by the size of your belly, increased need to urinate, and an increase in your body's metabolism.  You may notice the fetus "dropping," or moving lower in your abdomen (lightening).  You may have increased vaginal discharge.  You may notice your joints feel loose and you may have pain around your pelvic bone. What to expect at prenatal visits You will have prenatal exams every 2 weeks until week 36. Then you will have weekly prenatal exams. During a routine prenatal visit:  You will be weighed to make sure you and the baby are growing normally.  Your blood pressure will be taken.  Your abdomen will be measured to track your baby's growth.  The fetal heartbeat will be listened to.  Any test results from the previous visit will be discussed.  You may have a cervical check near your due date to see if your cervix has softened or thinned (effaced).  You will be tested for Group B streptococcus. This happens between 35 and 37 weeks. Your health care provider may ask you:  What your birth plan is.  How you are feeling.  If you are feeling the baby move.  If you have had any abnormal   symptoms, such as leaking fluid, bleeding, severe headaches, or abdominal cramping.  If you are using any tobacco products, including cigarettes, chewing tobacco, and electronic cigarettes.  If you have any questions. Other tests or screenings that may be performed during your third trimester include:  Blood tests that check for low iron levels (anemia).  Fetal testing to check the health, activity level, and growth of the fetus. Testing is done if you have certain medical conditions or if there are problems during the pregnancy.  Nonstress test  (NST). This test checks the health of your baby to make sure there are no signs of problems, such as the baby not getting enough oxygen. During this test, a belt is placed around your belly. The baby is made to move, and its heart rate is monitored during movement. What is false labor? False labor is a condition in which you feel small, irregular tightenings of the muscles in the womb (contractions) that usually go away with rest, changing position, or drinking water. These are called Braxton Hicks contractions. Contractions may last for hours, days, or even weeks before true labor sets in. If contractions come at regular intervals, become more frequent, increase in intensity, or become painful, you should see your health care provider. What are the signs of labor?  Abdominal cramps.  Regular contractions that start at 10 minutes apart and become stronger and more frequent with time.  Contractions that start on the top of the uterus and spread down to the lower abdomen and back.  Increased pelvic pressure and dull back pain.  A watery or bloody mucus discharge that comes from the vagina.  Leaking of amniotic fluid. This is also known as your "water breaking." It could be a slow trickle or a gush. Let your health care provider know if it has a color or strange odor. If you have any of these signs, call your health care provider right away, even if it is before your due date. Follow these instructions at home: Medicines  Follow your health care provider's instructions regarding medicine use. Specific medicines may be either safe or unsafe to take during pregnancy.  Take a prenatal vitamin that contains at least 600 micrograms (mcg) of folic acid.  If you develop constipation, try taking a stool softener if your health care provider approves. Eating and drinking   Eat a balanced diet that includes fresh fruits and vegetables, whole grains, good sources of protein such as meat, eggs, or tofu,  and low-fat dairy. Your health care provider will help you determine the amount of weight gain that is right for you.  Avoid raw meat and uncooked cheese. These carry germs that can cause birth defects in the baby.  If you have low calcium intake from food, talk to your health care provider about whether you should take a daily calcium supplement.  Eat four or five small meals rather than three large meals a day.  Limit foods that are high in fat and processed sugars, such as fried and sweet foods.  To prevent constipation: ? Drink enough fluid to keep your urine clear or pale yellow. ? Eat foods that are high in fiber, such as fresh fruits and vegetables, whole grains, and beans. Activity  Exercise only as directed by your health care provider. Most women can continue their usual exercise routine during pregnancy. Try to exercise for 30 minutes at least 5 days a week. Stop exercising if you experience uterine contractions.  Avoid heavy lifting.  Do   not exercise in extreme heat or humidity, or at high altitudes.  Wear low-heel, comfortable shoes.  Practice good posture.  You may continue to have sex unless your health care provider tells you otherwise. Relieving pain and discomfort  Take frequent breaks and rest with your legs elevated if you have leg cramps or low back pain.  Take warm sitz baths to soothe any pain or discomfort caused by hemorrhoids. Use hemorrhoid cream if your health care provider approves.  Wear a good support bra to prevent discomfort from breast tenderness.  If you develop varicose veins: ? Wear support pantyhose or compression stockings as told by your healthcare provider. ? Elevate your feet for 15 minutes, 3-4 times a day. Prenatal care  Write down your questions. Take them to your prenatal visits.  Keep all your prenatal visits as told by your health care provider. This is important. Safety  Wear your seat belt at all times when driving.  Make  a list of emergency phone numbers, including numbers for family, friends, the hospital, and police and fire departments. General instructions  Avoid cat litter boxes and soil used by cats. These carry germs that can cause birth defects in the baby. If you have a cat, ask someone to clean the litter box for you.  Do not travel far distances unless it is absolutely necessary and only with the approval of your health care provider.  Do not use hot tubs, steam rooms, or saunas.  Do not drink alcohol.  Do not use any products that contain nicotine or tobacco, such as cigarettes and e-cigarettes. If you need help quitting, ask your health care provider.  Do not use any medicinal herbs or unprescribed drugs. These chemicals affect the formation and growth of the baby.  Do not douche or use tampons or scented sanitary pads.  Do not cross your legs for long periods of time.  To prepare for the arrival of your baby: ? Take prenatal classes to understand, practice, and ask questions about labor and delivery. ? Make a trial run to the hospital. ? Visit the hospital and tour the maternity area. ? Arrange for maternity or paternity leave through employers. ? Arrange for family and friends to take care of pets while you are in the hospital. ? Purchase a rear-facing car seat and make sure you know how to install it in your car. ? Pack your hospital bag. ? Prepare the baby's nursery. Make sure to remove all pillows and stuffed animals from the baby's crib to prevent suffocation.  Visit your dentist if you have not gone during your pregnancy. Use a soft toothbrush to brush your teeth and be gentle when you floss. Contact a health care provider if:  You are unsure if you are in labor or if your water has broken.  You become dizzy.  You have mild pelvic cramps, pelvic pressure, or nagging pain in your abdominal area.  You have lower back pain.  You have persistent nausea, vomiting, or  diarrhea.  You have an unusual or bad smelling vaginal discharge.  You have pain when you urinate. Get help right away if:  Your water breaks before 37 weeks.  You have regular contractions less than 5 minutes apart before 37 weeks.  You have a fever.  You are leaking fluid from your vagina.  You have spotting or bleeding from your vagina.  You have severe abdominal pain or cramping.  You have rapid weight loss or weight gain.  You have   shortness of breath with chest pain.  You notice sudden or extreme swelling of your face, hands, ankles, feet, or legs.  Your baby makes fewer than 10 movements in 2 hours.  You have severe headaches that do not go away when you take medicine.  You have vision changes. Summary  The third trimester is from week 28 through week 40, months 7 through 9. The third trimester is a time when the unborn baby (fetus) is growing rapidly.  During the third trimester, your discomfort may increase as you and your baby continue to gain weight. You may have abdominal, leg, and back pain, sleeping problems, and an increased need to urinate.  During the third trimester your breasts will keep growing and they will continue to become tender. A yellow fluid (colostrum) may leak from your breasts. This is the first milk you are producing for your baby.  False labor is a condition in which you feel small, irregular tightenings of the muscles in the womb (contractions) that eventually go away. These are called Braxton Hicks contractions. Contractions may last for hours, days, or even weeks before true labor sets in.  Signs of labor can include: abdominal cramps; regular contractions that start at 10 minutes apart and become stronger and more frequent with time; watery or bloody mucus discharge that comes from the vagina; increased pelvic pressure and dull back pain; and leaking of amniotic fluid. This information is not intended to replace advice given to you by your  health care provider. Make sure you discuss any questions you have with your health care provider. Document Released: 10/31/2001 Document Revised: 12/12/2016 Document Reviewed: 12/12/2016 Elsevier Interactive Patient Education  2019 [employer]  

## 2019-01-07 NOTE — Progress Notes (Signed)
   PRENATAL VISIT NOTE  Subjective:  Teresa Harrington is a 23 y.o. G1P0 at [redacted]w[redacted]d who presents today for routine prenatal care.  She is currently being monitored for supervision of a low-risk pregnancy with problems as listed below.  Patient has no pregnancy related concerns and endorses fetal movement.  She denies vaginal concerns including discharge, bleeding, leaking, itching, and burning. Patient expresses desire for waterbirth as delivery method.   Patient Active Problem List   Diagnosis Date Noted  . Encounter for supervision of normal first pregnancy in second trimester 12/17/2018  . CONJUNCTIVITIS, VIRAL, ACUTE 09/29/2008  . VISUAL ACUITY, DECREASED, RIGHT EYE 12/27/2007  . SORE THROAT 12/27/2007  . ASTHMA, UNSPECIFIED 01/17/2007    The following portions of the patient's history were reviewed and updated as appropriate: allergies, current medications, past family history, past medical history, past social history, past surgical history and problem list. Problem list updated.  Objective:   Vitals:   01/07/19 0825  BP: 116/64  Pulse: 86  Weight: 172 lb 12.8 oz (78.4 kg)    Fetal Status: Fetal Heart Rate (bpm): 153   Movement: Present     General:  Alert, oriented and cooperative. Patient is in no acute distress.  Skin: Skin is warm and dry.   Cardiovascular: Regular rate and rhythm.  Respiratory: Normal respiratory effort. CTA-Bilaterally  Abdomen: Soft, gravid, appropriate for gestational age.  Pelvic: Cervical exam deferred        Extremities: Normal range of motion.  Edema: None  Mental Status: Normal mood and affect. Normal behavior. Normal judgment and thought content.   Assessment and Plan:  Pregnancy: G1P0 at [redacted]w[redacted]d  1. Encounter for supervision of normal first pregnancy in second trimester -Anticipatory guidance for upcoming visits. -Encouraged to start to consider contraception methods for PP. -Discussed starting to look for a pediatrician for baby girl  [name]. -Discussed information regarding waterbirth including need for completion of course and consent. *Contact information given for waterbirth preparation including number and website -28 week labs drawn and pending -Tdap vaccine greater than or equal to 7yo IM-Given -Plan to RTO in 3 weeks.   Preterm labor symptoms and general obstetric precautions including but not limited to vaginal bleeding, contractions, leaking of fluid and fetal movement were reviewed with the patient.  Please refer to After Visit Summary for other counseling recommendations.  No follow-ups on file.  Future Appointments  Date Time Provider Department Center  01/07/2019  8:40 AM [facility]  01/16/2019  3:15 PM [facility] [place] 4 [facility]    Cherre Robins, CNM 01/07/2019, 8:34 AM

## 2019-01-08 LAB — CBC
HEMATOCRIT: 35.8 % (ref 34.0–46.6)
Hemoglobin: 12.8 g/dL (ref 11.1–15.9)
MCH: 31.8 pg (ref 26.6–33.0)
MCHC: 35.8 g/dL — ABNORMAL HIGH (ref 31.5–35.7)
MCV: 89 fL (ref 79–97)
Platelets: 249 10*3/uL (ref 150–450)
RBC: 4.03 x10E6/uL (ref 3.77–5.28)
RDW: 11.9 % (ref 11.7–15.4)
WBC: 8.4 10*3/uL (ref 3.4–10.8)

## 2019-01-08 LAB — GLUCOSE TOLERANCE, 2 HOURS W/ 1HR
GLUCOSE, 1 HOUR: 93 mg/dL (ref 65–179)
GLUCOSE, 2 HOUR: 94 mg/dL (ref 65–152)
Glucose, Fasting: 80 mg/dL (ref 65–91)

## 2019-01-08 LAB — HIV ANTIBODY (ROUTINE TESTING W REFLEX): HIV Screen 4th Generation wRfx: NONREACTIVE

## 2019-01-08 LAB — RPR: RPR Ser Ql: NONREACTIVE

## 2019-01-16 ENCOUNTER — Ambulatory Visit (HOSPITAL_COMMUNITY): Payer: BLUE CROSS/BLUE SHIELD

## 2019-01-16 ENCOUNTER — Encounter (HOSPITAL_COMMUNITY): Payer: Self-pay

## 2019-01-21 ENCOUNTER — Ambulatory Visit (HOSPITAL_COMMUNITY): Payer: BLUE CROSS/BLUE SHIELD | Admitting: *Deleted

## 2019-01-21 ENCOUNTER — Ambulatory Visit (HOSPITAL_COMMUNITY): Payer: BLUE CROSS/BLUE SHIELD

## 2019-01-21 ENCOUNTER — Ambulatory Visit (HOSPITAL_COMMUNITY)
Admission: RE | Admit: 2019-01-21 | Discharge: 2019-01-21 | Disposition: A | Discharge status: Home / Self Care | Payer: BLUE CROSS/BLUE SHIELD | Source: Ambulatory Visit | Attending: Obstetrics and Gynecology | Admitting: Obstetrics and Gynecology

## 2019-01-21 ENCOUNTER — Encounter (HOSPITAL_COMMUNITY): Payer: Self-pay

## 2019-01-21 VITALS — BP 134/70 | HR 93 | Wt 177.0 lb

## 2019-01-21 DIAGNOSIS — Z3A29 29 weeks gestation of pregnancy: Secondary | ICD-10-CM | POA: Diagnosis not present

## 2019-01-21 DIAGNOSIS — Z362 Encounter for other antenatal screening follow-up: Secondary | ICD-10-CM | POA: Diagnosis present

## 2019-01-21 DIAGNOSIS — Z3402 Encounter for supervision of normal first pregnancy, second trimester: Secondary | ICD-10-CM | POA: Insufficient documentation

## 2019-01-22 ENCOUNTER — Other Ambulatory Visit (HOSPITAL_COMMUNITY): Payer: Self-pay | Admitting: *Deleted

## 2019-01-22 DIAGNOSIS — Z362 Encounter for other antenatal screening follow-up: Secondary | ICD-10-CM

## 2019-01-28 ENCOUNTER — Other Ambulatory Visit: Payer: Self-pay

## 2019-01-28 ENCOUNTER — Ambulatory Visit (INDEPENDENT_AMBULATORY_CARE_PROVIDER_SITE_OTHER): Payer: BLUE CROSS/BLUE SHIELD | Admitting: Student

## 2019-01-28 ENCOUNTER — Telehealth: Payer: Self-pay | Admitting: Lactation Services

## 2019-01-28 DIAGNOSIS — Z3403 Encounter for supervision of normal first pregnancy, third trimester: Secondary | ICD-10-CM

## 2019-01-28 DIAGNOSIS — Z3402 Encounter for supervision of normal first pregnancy, second trimester: Secondary | ICD-10-CM

## 2019-01-28 DIAGNOSIS — Z3A30 30 weeks gestation of pregnancy: Secondary | ICD-10-CM

## 2019-01-28 NOTE — Progress Notes (Signed)
    PRENATAL VISIT NOTE  Subjective:  Teresa Harrington is a 23 y.o. G1P0 at [redacted]w[redacted]d being seen today for ongoing prenatal care.  She is currently monitored for the following issues for this low-risk pregnancy and has CONJUNCTIVITIS, VIRAL, ACUTE; VISUAL ACUITY, DECREASED, RIGHT EYE; SORE THROAT; ASTHMA, UNSPECIFIED; and Encounter for supervision of normal first pregnancy in second trimester on their problem list.  Patient reports occasional round ligament pain.   Contractions: Not present. Vag. Bleeding: None.  Movement: Present. Denies leaking of fluid.   The following portions of the patient's history were reviewed and updated as appropriate: allergies, current medications, past family history, past medical history, past social history, past surgical history and problem list.   Objective:   Vitals:   01/28/19 1054  BP: 118/71  Pulse: (!) 102  Weight: 174 lb 11.2 oz (79.2 kg)    Fetal Status: Fetal Heart Rate (bpm): 145 Fundal Height: 30 cm Movement: Present     General:  Alert, oriented and cooperative. Patient is in no acute distress.  Skin: Skin is warm and dry. No rash noted.   Cardiovascular: Normal heart rate noted  Respiratory: Normal respiratory effort, no problems with respiration noted  Abdomen: Soft, gravid, appropriate for gestational age.  Pain/Pressure: Present     Pelvic: Cervical exam deferred        Extremities: Normal range of motion.  Edema: None  Mental Status: Normal mood and affect. Normal behavior. Normal judgment and thought content.   Assessment and Plan:  Pregnancy: G1P0 at [redacted]w[redacted]d 1. Encounter for supervision of normal first pregnancy in second trimester -Return to clinic in two weeks -Anatomy scan follow up scheduled -Long discussion about birth control; she does not want pills as they did not work for her before. She does not want "anything inside her"-declines Nexplanon and IUD. We talked about the patch and patient will consider. Reviewed theoretical  risk of decreased milk supply.   Preterm labor symptoms and general obstetric precautions including but not limited to vaginal bleeding, contractions, leaking of fluid and fetal movement were reviewed in detail with the patient. Please refer to After Visit Summary for other counseling recommendations.   Return in about 2 weeks (around 02/11/2019), or LROB.  Future Appointments  Date Time Provider Department Center  02/11/2019 11:15 AM Madlyn Frankel [facility] WOC  02/18/2019  2:00 PM WH-MFC [place] 3 WH-MFCUS MFC-US    Marylene Land, [place]

## 2019-01-28 NOTE — Patient Instructions (Signed)

## 2019-01-28 NOTE — Telephone Encounter (Signed)
Spoke with Teresa Harrington while in office for OB check up. Enc Ms. Delker to contact her Insurance company to inquire if they provide an electric breast pump and to obtain prior to delivery if possible. Pt reports she is planning to BF. Pt reports she plans to apply for [facility], discussed that [facility] has a BF class available for their clients. Pt reports she does not have an BF questions at this time. BF teaching started.

## 2019-02-02 ENCOUNTER — Inpatient Hospital Stay (HOSPITAL_COMMUNITY)
Admission: AD | Admit: 2019-02-02 | Discharge: 2019-02-02 | Disposition: A | Discharge status: Home / Self Care | Payer: BLUE CROSS/BLUE SHIELD | Attending: Obstetrics & Gynecology | Admitting: Obstetrics & Gynecology

## 2019-02-02 ENCOUNTER — Encounter (HOSPITAL_COMMUNITY): Payer: Self-pay

## 2019-02-02 ENCOUNTER — Other Ambulatory Visit: Payer: Self-pay

## 2019-02-02 DIAGNOSIS — O360130 Maternal care for anti-D [Rh] antibodies, third trimester, not applicable or unspecified: Secondary | ICD-10-CM

## 2019-02-02 DIAGNOSIS — O26853 Spotting complicating pregnancy, third trimester: Secondary | ICD-10-CM

## 2019-02-02 DIAGNOSIS — Z6790 Unspecified blood type, Rh positive: Secondary | ICD-10-CM

## 2019-02-02 DIAGNOSIS — Z3A31 31 weeks gestation of pregnancy: Secondary | ICD-10-CM | POA: Diagnosis not present

## 2019-02-02 LAB — WET PREP, GENITAL
Sperm: NONE SEEN
TRICH WET PREP: NONE SEEN
Yeast Wet Prep HPF POC: NONE SEEN

## 2019-02-02 NOTE — MAU Provider Note (Signed)
History     CSN: [id number]  Arrival date and time: 02/02/19 1510   First Provider Initiated Contact with Patient 02/02/19 1541     G1 @31 .3 wks presenting with spotting. Reports seeing red blood when she wiped a few hrs ago. No vaginal itching or discharge prior. No recent IC. No abd pain or ctx. Reports good FM. No recent injury or falls.   OB History    Gravida  1   Para      Term      Preterm      AB      Living  0     SAB      TAB      Ectopic      Multiple      Live Births              Past Medical History:  Diagnosis Date  . Medical history non-contributory     Past Surgical History:  Procedure Laterality Date  . NO PAST SURGERIES      Family History  Problem Relation Age of Onset  . Healthy Mother   . Healthy Father     Social History   Tobacco Use  . Smoking status: Never Smoker  . Smokeless tobacco: Never Used  Substance Use Topics  . Alcohol use: No  . Drug use: No    Allergies:  Allergies  Allergen Reactions  . Tramadol Hives  . Other Hives    AGENT: Celery     Medications Prior to Admission  Medication Sig Dispense Refill Last Dose  . Elastic Bandages & Supports (COMFORT FIT MATERNITY SUPP SM) MISC 1 Units by Does not apply route daily as needed. 1 each 0 Taking  . ondansetron (ZOFRAN ODT) 4 MG disintegrating tablet Take 1 tablet (4 mg total) by mouth every 8 (eight) hours as needed for nausea or vomiting. (Patient not taking: Reported on 01/21/2019) 15 tablet 0 Not Taking  . Prenatal Vit-Fe Fumarate-FA (PRENATAL COMPLETE) 14-0.4 MG TABS Take 1 tablet by mouth daily. 60 each 1 Taking    Review of Systems  Gastrointestinal: Negative for abdominal pain.  Genitourinary: Positive for vaginal bleeding. Negative for vaginal discharge.   Physical Exam   Blood pressure (!) 150/76, pulse 97, temperature 98.7 F (37.1 C), temperature source Oral, resp. rate 16, last menstrual period 06/20/2018, SpO2 98 %.  Physical Exam   Constitutional: She is oriented to person, place, and time. She appears well-developed and well-nourished. No distress.  HENT:  Head: Normocephalic and atraumatic.  Neck: Normal range of motion.  Cardiovascular: Normal rate.  Respiratory: Effort normal. No respiratory distress.  GI: Soft. She exhibits no distension. There is no abdominal tenderness.  gravid  Genitourinary:    Genitourinary Comments: External: no lesions or erythema Vagina: rugated, pink, moist, thin white discharge, no trace of blood Cervix closed/thick    Musculoskeletal: Normal range of motion.  Neurological: She is alert and oriented to person, place, and time.  Skin: Skin is warm and dry.  Psychiatric: She has a normal mood and affect.  EFM: 145 bpm, mod variability, + accels, no decels Toco: irritability  Results for orders placed or performed during the hospital encounter of 02/02/19 (from the past 24 hour(s))  Wet prep, genital     Status: Abnormal   Collection Time: 02/02/19  3:50 PM  Result Value Ref Range   Yeast Wet Prep HPF POC NONE SEEN NONE SEEN   Trich, Wet Prep NONE SEEN NONE SEEN  Clue Cells Wet Prep HPF POC PRESENT (A) NONE SEEN   WBC, Wet Prep HPF POC MANY (A) NONE SEEN   Sperm NONE SEEN    MAU Course  Procedures Orders Placed This Encounter  Procedures  . Wet prep, genital    Standing Status:   Standing    Number of Occurrences:   1    Order Specific Question:   Patient immune status    Answer:   Normal  . Discharge patient    Order Specific Question:   Discharge disposition    Answer:   01-Home or Self Care [1]    Order Specific Question:   Discharge patient date    Answer:   02/02/2019   MDM Labs ordered and reviewed. GC pending. No evidence of infection, abruption, or PTL. Stable for discharge home.   Assessment and Plan   1. [redacted] weeks gestation of pregnancy   2. Spotting affecting pregnancy in third trimester   3. Blood type, Rh positive    Discharge home [facility] at  [facility] as scheduled PTL/abruption precautions  Allergies as of 02/02/2019      Reactions   Tramadol Hives   Other Hives   AGENT: Celery       Medication List    TAKE these medications   Comfort Fit Maternity Supp Sm Misc 1 Units by Does not apply route daily as needed.   ondansetron 4 MG disintegrating tablet Commonly known as:  Zofran ODT Take 1 tablet (4 mg total) by mouth every 8 (eight) hours as needed for nausea or vomiting.   Prenatal Complete 14-0.4 MG Tabs Take 1 tablet by mouth daily.      Donette Larry, CNM 02/02/2019, 4:25 PM

## 2019-02-02 NOTE — Discharge Instructions (Signed)
Vaginal Bleeding During Pregnancy, Third Trimester ° °A small amount of bleeding (spotting) from the vagina is common during pregnancy. Sometimes the bleeding is normal and is not a problem, and sometimes it is a sign of something serious. Tell your doctor about any bleeding from your vagina right away. °Follow these instructions at home: °Activity °· Follow your doctor's instructions about how active you can be. Your doctor may recommend that you: °? Stay in bed and only get up to use the bathroom. °? Continue light activity. °· If needed, make plans for someone to help you with your normal activities. °· Ask your doctor if it is safe for you to drive. °· Do not lift anything that is heavier than 10 lb (4.5 kg) until your doctor says that this is safe. °· Do not have sex or orgasms until your doctor says that this is safe. °Medicines °· Take over-the-counter and prescription medicines only as told by your doctor. °· Do not take aspirin. It can cause bleeding. °General instructions °· Watch your condition for any changes. °· Write down: °? The number of pads you use each day. °? How often you change pads. °? How soaked (saturated) your pads are. °· Do not use tampons. °· Do not douche. °· If you pass any tissue from your vagina, save the tissue to show your doctor. °· Keep all follow-up visits as told by your doctor. This is important. °Contact a doctor if: °· You have vaginal bleeding at any time during pregnancy. °· You have cramps. °· You have a fever. °Get help right away if: °· You have very bad cramps. °· You have very bad pain in your back or belly (abdomen). °· You have a gush of fluid from your vagina. °· You pass large clots or a lot of tissue from your vagina. °· Your bleeding gets worse. °· You feel light-headed or weak. °· You pass out (faint). °· Your baby is moving less than usual, or not moving at all. °Summary °· Tell your doctor about any bleeding from your vagina right away. °· Follow instructions  from your doctor about how active you can be. You may need someone to help you with your normal activities. °This information is not intended to replace advice given to you by your health care provider. Make sure you discuss any questions you have with your health care provider. °Document Released: 03/23/2014 Document Revised: 02/07/2017 Document Reviewed: 02/07/2017 °Elsevier Interactive Patient Education © 2019 Elsevier Inc. ° °

## 2019-02-02 NOTE — MAU Note (Signed)
Pt c/o spotting starting today around 12 noon. Pt denies pain, pt reports good fetal movement.

## 2019-02-03 LAB — GC/CHLAMYDIA PROBE AMP (~~LOC~~) NOT AT ARMC
Chlamydia: NEGATIVE
NEISSERIA GONORRHEA: NEGATIVE

## 2019-02-04 DIAGNOSIS — B9689 Other specified bacterial agents as the cause of diseases classified elsewhere: Secondary | ICD-10-CM

## 2019-02-04 DIAGNOSIS — N760 Acute vaginitis: Principal | ICD-10-CM

## 2019-02-04 MED ORDER — METRONIDAZOLE 500 MG PO TABS: 500.0000 mg | ORAL_TABLET | Freq: Two times a day (BID) | ORAL | 0 refills | Status: DC

## 2019-02-11 ENCOUNTER — Ambulatory Visit (INDEPENDENT_AMBULATORY_CARE_PROVIDER_SITE_OTHER): Payer: BLUE CROSS/BLUE SHIELD | Admitting: Student

## 2019-02-11 ENCOUNTER — Other Ambulatory Visit: Payer: Self-pay

## 2019-02-11 VITALS — BP 122/67 | HR 83 | Temp 98.2°F | Wt 176.0 lb

## 2019-02-11 DIAGNOSIS — Z3403 Encounter for supervision of normal first pregnancy, third trimester: Secondary | ICD-10-CM

## 2019-02-11 DIAGNOSIS — Z3A32 32 weeks gestation of pregnancy: Secondary | ICD-10-CM

## 2019-02-11 DIAGNOSIS — Z3402 Encounter for supervision of normal first pregnancy, second trimester: Secondary | ICD-10-CM

## 2019-02-11 NOTE — Patient Instructions (Signed)

## 2019-02-11 NOTE — Progress Notes (Signed)
   PRENATAL VISIT NOTE  Subjective:  Teresa Harrington is a 23 y.o. G1P0 at [redacted]w[redacted]d being seen today for ongoing prenatal care.  She is currently monitored for the following issues for this low-risk pregnancy and has CONJUNCTIVITIS, VIRAL, ACUTE; VISUAL ACUITY, DECREASED, RIGHT EYE; SORE THROAT; ASTHMA, UNSPECIFIED; and Encounter for supervision of normal first pregnancy in second trimester on their problem list.  Patient reports no complaints.  Contractions: Irritability. Vag. Bleeding: None.  Movement: Present. Denies leaking of fluid.   The following portions of the patient's history were reviewed and updated as appropriate: allergies, current medications, past family history, past medical history, past social history, past surgical history and problem list.   Objective:   Vitals:   02/11/19 1125  BP: 122/67  Pulse: 83  Temp: 98.2 F (36.8 C)  Weight: 176 lb (79.8 kg)    Fetal Status: Fetal Heart Rate (bpm): 139 Fundal Height: 32 cm Movement: Present     General:  Alert, oriented and cooperative. Patient is in no acute distress.  Skin: Skin is warm and dry. No rash noted.   Cardiovascular: Normal heart rate noted  Respiratory: Normal respiratory effort, no problems with respiration noted  Abdomen: Soft, gravid, appropriate for gestational age.  Pain/Pressure: Present     Pelvic: Cervical exam deferred        Extremities: Normal range of motion.  Edema: None  Mental Status: Normal mood and affect. Normal behavior. Normal judgment and thought content.   Assessment and Plan:  Pregnancy: G1P0 at [redacted]w[redacted]d 1. Encounter for supervision of normal first pregnancy in second trimester -Patient doing well; discussed Baby[employer]. Patient is willing to enroll in order to have decreased OV.  -Keep [place] appt on 02/18/2019 - Enroll Patient in Babyscripts - [facility] AMB BABYSCRIPTS SCHEDULE OPTIMIZATION  Preterm labor symptoms and general obstetric precautions including but not limited to vaginal bleeding,  contractions, leaking of fluid and fetal movement were reviewed in detail with the patient. Please refer to After Visit Summary for other counseling recommendations.   Return in about 4 weeks (around 03/11/2019), or LROB.  Future Appointments  Date Time Provider Department Center  02/18/2019  2:00 PM WH-MFC [place] 3 WH-MFCUS MFC-US  03/11/2019 10:15 AM Marylene Land, CNM WOC-WOCA WOC    Charlesetta Garibaldi [place], [place]

## 2019-02-18 ENCOUNTER — Ambulatory Visit (HOSPITAL_COMMUNITY)
Admission: RE | Admit: 2019-02-18 | Discharge: 2019-02-18 | Disposition: A | Discharge status: Home / Self Care | Payer: BLUE CROSS/BLUE SHIELD | Source: Ambulatory Visit | Attending: Student | Admitting: Student

## 2019-02-18 ENCOUNTER — Other Ambulatory Visit (HOSPITAL_COMMUNITY): Payer: Self-pay | Admitting: *Deleted

## 2019-02-18 ENCOUNTER — Other Ambulatory Visit: Payer: Self-pay

## 2019-02-18 DIAGNOSIS — Z3A33 33 weeks gestation of pregnancy: Secondary | ICD-10-CM

## 2019-02-18 DIAGNOSIS — O368990 Maternal care for other specified fetal problems, unspecified trimester, not applicable or unspecified: Secondary | ICD-10-CM

## 2019-02-18 DIAGNOSIS — Z362 Encounter for other antenatal screening follow-up: Secondary | ICD-10-CM | POA: Diagnosis not present

## 2019-03-04 ENCOUNTER — Ambulatory Visit (HOSPITAL_COMMUNITY): Payer: BLUE CROSS/BLUE SHIELD | Admitting: *Deleted

## 2019-03-04 ENCOUNTER — Other Ambulatory Visit: Payer: Self-pay

## 2019-03-04 ENCOUNTER — Ambulatory Visit (HOSPITAL_COMMUNITY)
Admission: RE | Admit: 2019-03-04 | Discharge: 2019-03-04 | Disposition: A | Discharge status: Home / Self Care | Payer: BLUE CROSS/BLUE SHIELD | Source: Ambulatory Visit | Attending: Obstetrics and Gynecology | Admitting: Obstetrics and Gynecology

## 2019-03-04 ENCOUNTER — Encounter (HOSPITAL_COMMUNITY): Payer: Self-pay | Admitting: *Deleted

## 2019-03-04 DIAGNOSIS — Z3402 Encounter for supervision of normal first pregnancy, second trimester: Secondary | ICD-10-CM | POA: Insufficient documentation

## 2019-03-04 DIAGNOSIS — O368930 Maternal care for other specified fetal problems, third trimester, not applicable or unspecified: Secondary | ICD-10-CM

## 2019-03-04 DIAGNOSIS — O368990 Maternal care for other specified fetal problems, unspecified trimester, not applicable or unspecified: Secondary | ICD-10-CM | POA: Diagnosis present

## 2019-03-04 DIAGNOSIS — Z3A35 35 weeks gestation of pregnancy: Secondary | ICD-10-CM

## 2019-03-10 ENCOUNTER — Telehealth: Payer: Self-pay | Admitting: Family Medicine

## 2019-03-10 NOTE — Telephone Encounter (Signed)
Called the patient to inform of upcoming appointment. Left a detailed voicemail with new location address.

## 2019-03-10 NOTE — Telephone Encounter (Signed)
Called patient about appointment time changed

## 2019-03-11 ENCOUNTER — Encounter: Payer: BLUE CROSS/BLUE SHIELD | Admitting: Advanced Practice Midwife

## 2019-03-11 ENCOUNTER — Ambulatory Visit (INDEPENDENT_AMBULATORY_CARE_PROVIDER_SITE_OTHER): Payer: BLUE CROSS/BLUE SHIELD | Admitting: Obstetrics and Gynecology

## 2019-03-11 ENCOUNTER — Other Ambulatory Visit: Payer: Self-pay

## 2019-03-11 ENCOUNTER — Encounter: Payer: Self-pay | Admitting: Obstetrics and Gynecology

## 2019-03-11 VITALS — BP 129/76 | HR 93 | Temp 98.7°F | Wt 184.7 lb

## 2019-03-11 DIAGNOSIS — Z3403 Encounter for supervision of normal first pregnancy, third trimester: Secondary | ICD-10-CM

## 2019-03-11 DIAGNOSIS — Z113 Encounter for screening for infections with a predominantly sexual mode of transmission: Secondary | ICD-10-CM | POA: Diagnosis not present

## 2019-03-11 DIAGNOSIS — Z3402 Encounter for supervision of normal first pregnancy, second trimester: Secondary | ICD-10-CM

## 2019-03-11 DIAGNOSIS — Z3A36 36 weeks gestation of pregnancy: Secondary | ICD-10-CM

## 2019-03-11 NOTE — Progress Notes (Signed)
Pt has BP cuff at home and is recording in babyscripts.

## 2019-03-11 NOTE — Progress Notes (Signed)
   PRENATAL VISIT NOTE  Subjective:  Teresa Harrington is a 23 y.o. G1P0 at [redacted]w[redacted]d being seen today for ongoing prenatal care.  She is currently monitored for the following issues for this low-risk pregnancy and has CONJUNCTIVITIS, VIRAL, ACUTE; VISUAL ACUITY, DECREASED, RIGHT EYE; SORE THROAT; ASTHMA, UNSPECIFIED; and Encounter for supervision of normal first pregnancy in second trimester on their problem list.  Patient reports no complaints.  Contractions: Irregular. Vag. Bleeding: None.  Movement: Present. Denies leaking of fluid.   The following portions of the patient's history were reviewed and updated as appropriate: allergies, current medications, past family history, past medical history, past social history, past surgical history and problem list.   Objective:   Vitals:   03/11/19 1049  BP: 129/76  Pulse: 93  Temp: 98.7 F (37.1 C)  Weight: 184 lb 11.2 oz (83.8 kg)    Fetal Status: Fetal Heart Rate (bpm): 152   Movement: Present     General:  Alert, oriented and cooperative. Patient is in no acute distress.  Skin: Skin is warm and dry. No rash noted.   Cardiovascular: Normal heart rate noted  Respiratory: Normal respiratory effort, no problems with respiration noted  Abdomen: Soft, gravid, appropriate for gestational age.  Pain/Pressure: Present     Pelvic: Cervical exam performed        Extremities: Normal range of motion.  Edema: None  Mental Status: Normal mood and affect. Normal behavior. Normal judgment and thought content.   Assessment and Plan:  Pregnancy: G1P0 at [redacted]w[redacted]d 1. Encounter for supervision of normal first pregnancy in second trimester Patient is doing well without complaints Cultures today Patient has access to a BP cuff and understands that her next visit will be a virtual visit - Culture, beta strep (group b only) - GC/Chlamydia probe amp ([place])not at [facility]  Preterm labor symptoms and general obstetric precautions including but not limited to  vaginal bleeding, contractions, leaking of fluid and fetal movement were reviewed in detail with the patient. Please refer to After Visit Summary for other counseling recommendations.   Return in about 1 week (around 03/18/2019) for [facility], [facility].  Future Appointments  Date Time Provider Department Center  03/19/2019  1:55 PM [place], MD [facility]    Catalina Antigua, MD

## 2019-03-12 LAB — GC/CHLAMYDIA PROBE AMP (~~LOC~~) NOT AT ARMC
Chlamydia: NEGATIVE
Neisseria Gonorrhea: NEGATIVE

## 2019-03-14 ENCOUNTER — Telehealth: Payer: Self-pay | Admitting: Obstetrics and Gynecology

## 2019-03-14 NOTE — Telephone Encounter (Signed)
Changed to a provider completing virtual visits per Mrs. Antionette.

## 2019-03-15 LAB — CULTURE, BETA STREP (GROUP B ONLY): Strep Gp B Culture: NEGATIVE

## 2019-03-17 ENCOUNTER — Telehealth: Payer: Self-pay | Admitting: *Deleted

## 2019-03-17 NOTE — Telephone Encounter (Signed)
I called Teresa Harrington re: her upcoming virtual visit on 03/19/19. She has downloaded [employer] app already. I explained what to expect for the virtual visit. She voices understanding.

## 2019-03-18 ENCOUNTER — Telehealth: Payer: Self-pay | Admitting: Student

## 2019-03-18 NOTE — Telephone Encounter (Signed)
Called the patient to inform of upcoming virtual visit. Patient stated she has the [employer].

## 2019-03-19 ENCOUNTER — Other Ambulatory Visit: Payer: Self-pay

## 2019-03-19 ENCOUNTER — Encounter: Payer: BLUE CROSS/BLUE SHIELD | Admitting: Obstetrics and Gynecology

## 2019-03-19 ENCOUNTER — Ambulatory Visit (INDEPENDENT_AMBULATORY_CARE_PROVIDER_SITE_OTHER): Payer: BLUE CROSS/BLUE SHIELD | Admitting: Obstetrics and Gynecology

## 2019-03-19 ENCOUNTER — Encounter: Payer: Self-pay | Admitting: Obstetrics and Gynecology

## 2019-03-19 DIAGNOSIS — Z3A37 37 weeks gestation of pregnancy: Secondary | ICD-10-CM

## 2019-03-19 DIAGNOSIS — Z3403 Encounter for supervision of normal first pregnancy, third trimester: Secondary | ICD-10-CM

## 2019-03-19 DIAGNOSIS — Z3402 Encounter for supervision of normal first pregnancy, second trimester: Secondary | ICD-10-CM

## 2019-03-19 NOTE — Progress Notes (Signed)
   PRENATAL VISIT NOTE TELEHEALTH VIRTUAL OBSTETRICS VISIT ENCOUNTER NOTE  I connected with Teresa Harrington on 03/19/19 at  9:15 AM EDT by Webex at home and verified that I am speaking with the correct person using two identifiers.   I discussed the limitations, risks, security and privacy concerns of performing an evaluation and management service by telephone and the availability of in person appointments. I also discussed with the patient that there may be a patient responsible charge related to this service. The patient expressed understanding and agreed to proceed. Subjective:  Teresa Harrington is a 23 y.o. G1P0 at [redacted]w[redacted]d being seen today for ongoing prenatal care.  She is currently monitored for the following issues for this low-risk pregnancy and has CONJUNCTIVITIS, VIRAL, ACUTE; VISUAL ACUITY, DECREASED, RIGHT EYE; SORE THROAT; ASTHMA, UNSPECIFIED; and Encounter for supervision of normal first pregnancy in second trimester on their problem list.  Patient reports occasional contractions.  Reports fetal movement. Contractions: Irregular. Vag. Bleeding: None.  Movement: Present. Denies any contractions, bleeding or leaking of fluid.   The following portions of the patient's history were reviewed and updated as appropriate: allergies, current medications, past family history, past medical history, past social history, past surgical history and problem list.   Objective:  There were no vitals filed for this visit.  Fetal Status:     Movement: Present     General:  Alert, oriented and cooperative. Patient is in no acute distress.  Respiratory: Normal respiratory effort, no problems with respiration noted  Mental Status: Normal mood and affect. Normal behavior. Normal judgment and thought content.  Rest of physical exam deferred due to type of encounter  Assessment and Plan:  Pregnancy: G1P0 at [redacted]w[redacted]d  1. Encounter for supervision of normal first pregnancy in second trimester - Patient  taken BP at home 124/70 - reviewed plan for induction, expectations for induction - reviewed reasons she would need a c-section, none indicated at this point - patient verbalizes understanding of the above, answered all questions   Term labor symptoms and general obstetric precautions including but not limited to vaginal bleeding, contractions, leaking of fluid and fetal movement were reviewed in detail with the patient. I discussed the assessment and treatment plan with the patient. The patient was provided an opportunity to ask questions and all were answered. The patient agreed with the plan and demonstrated an understanding of the instructions. The patient was advised to call back or seek an in-person office evaluation/go to MAU at [facility] for any urgent or concerning symptoms. Please refer to After Visit Summary for other counseling recommendations.   I provided 12 minutes of non-face-to-face time during this encounter.  Return in about 1 week (around 03/26/2019) for OB visit.  No future appointments.  Conan Bowens, MD Center for [facility], [facility]

## 2019-03-25 ENCOUNTER — Encounter (HOSPITAL_COMMUNITY): Payer: Self-pay

## 2019-03-25 ENCOUNTER — Inpatient Hospital Stay (HOSPITAL_COMMUNITY)
Admission: AD | Admit: 2019-03-25 | Discharge: 2019-03-25 | Disposition: A | Discharge status: Home / Self Care | Payer: BLUE CROSS/BLUE SHIELD | Source: Ambulatory Visit | Attending: Obstetrics and Gynecology | Admitting: Obstetrics and Gynecology

## 2019-03-25 ENCOUNTER — Other Ambulatory Visit: Payer: Self-pay

## 2019-03-25 DIAGNOSIS — Z3A38 38 weeks gestation of pregnancy: Secondary | ICD-10-CM | POA: Diagnosis not present

## 2019-03-25 DIAGNOSIS — O471 False labor at or after 37 completed weeks of gestation: Secondary | ICD-10-CM | POA: Diagnosis present

## 2019-03-25 DIAGNOSIS — O479 False labor, unspecified: Secondary | ICD-10-CM

## 2019-03-25 NOTE — MAU Note (Signed)
I have communicated with Steward Drone CNM and reviewed vital signs:  Vitals:   03/25/19 0640 03/25/19 0800  BP: 132/86 126/80  Pulse: 72 71  Resp:  18  Temp:    SpO2: 98% 100%    Vaginal exam:  Dilation: 1 Effacement (%): Thick Cervical Position: Middle Station: -3 Presentation: Vertex Exam by:: Marvel Plan RN ,   Also reviewed contraction pattern and that non-stress test is reactive.  It has been documented that patient is contracting every 3-5 minutes with no cervical change over 1 hours not indicating active labor.  Patient denies any other complaints.  Based on this report provider has given order for discharge.  A discharge order and diagnosis entered by a provider.   Labor discharge instructions reviewed with patient.

## 2019-03-25 NOTE — Discharge Instructions (Signed)
Braxton Hicks Contractions Contractions of the uterus can occur throughout pregnancy, but they are not always a sign that you are in labor. You may have practice contractions called Braxton Hicks contractions. These false labor contractions are sometimes confused with true labor. What are Braxton Hicks contractions? Braxton Hicks contractions are tightening movements that occur in the muscles of the uterus before labor. Unlike true labor contractions, these contractions do not result in opening (dilation) and thinning of the cervix. Toward the end of pregnancy (32-34 weeks), Braxton Hicks contractions can happen more often and may become stronger. These contractions are sometimes difficult to tell apart from true labor because they can be very uncomfortable. You should not feel embarrassed if you go to the hospital with false labor. Sometimes, the only way to tell if you are in true labor is for your health care provider to look for changes in the cervix. The health care provider will do a physical exam and may monitor your contractions. If you are not in true labor, the exam should show that your cervix is not dilating and your water has not broken. If there are no other health problems associated with your pregnancy, it is completely safe for you to be sent home with false labor. You may continue to have Braxton Hicks contractions until you go into true labor. How to tell the difference between true labor and false labor True labor  Contractions last 30-70 seconds.  Contractions become very regular.  Discomfort is usually felt in the top of the uterus, and it spreads to the lower abdomen and low back.  Contractions do not go away with walking.  Contractions usually become more intense and increase in frequency.  The cervix dilates and gets thinner. False labor  Contractions are usually shorter and not as strong as true labor contractions.  Contractions are usually irregular.  Contractions  are often felt in the front of the lower abdomen and in the groin.  Contractions may go away when you walk around or change positions while lying down.  Contractions get weaker and are shorter-lasting as time goes on.  The cervix usually does not dilate or become thin. Follow these instructions at home:   Take over-the-counter and prescription medicines only as told by your health care provider.  Keep up with your usual exercises and follow other instructions from your health care provider.  Eat and drink lightly if you think you are going into labor.  If Braxton Hicks contractions are making you uncomfortable: ? Change your position from lying down or resting to walking, or change from walking to resting. ? Sit and rest in a tub of warm water. ? Drink enough fluid to keep your urine pale yellow. Dehydration may cause these contractions. ? Do slow and deep breathing several times an hour.  Keep all follow-up prenatal visits as told by your health care provider. This is important. Contact a health care provider if:  You have a fever.  You have continuous pain in your abdomen. Get help right away if:  Your contractions become stronger, more regular, and closer together.  You have fluid leaking or gushing from your vagina.  You pass blood-tinged mucus (bloody show).  You have bleeding from your vagina.  You have low back pain that you never had before.  You feel your baby's head pushing down and causing pelvic pressure.  Your baby is not moving inside you as much as it used to. Summary  Contractions that occur before labor are   called Braxton Hicks contractions, false labor, or practice contractions.  Braxton Hicks contractions are usually shorter, weaker, farther apart, and less regular than true labor contractions. True labor contractions usually become progressively stronger and regular, and they become more frequent.  Manage discomfort from Braxton Hicks contractions  by changing position, resting in a warm bath, drinking plenty of water, or practicing deep breathing. This information is not intended to replace advice given to you by your health care provider. Make sure you discuss any questions you have with your health care provider. Document Released: 03/22/2017 Document Revised: 08/21/2017 Document Reviewed: 03/22/2017 Elsevier Interactive Patient Education  2019 [employer]vier Inc[employer]  

## 2019-03-25 NOTE — MAU Provider Note (Signed)
S: Teresa Harrington is a 23 y.o. G1P0 at [redacted]w[redacted]d  who presents to MAU today for labor evaluation.     Cervical exam by RN:  Dilation: 1 Effacement (%): Thick Cervical Position: Middle Station: -3 Presentation: Vertex Exam by:: Marvel Plan RN   Fetal Monitoring: Baseline: 140 Variability: moderate Accelerations: present Decelerations: none Contractions: occasional mild contractions   MDM Discussed patient with RN. NST reviewed.   A: SIUP at [redacted]w[redacted]d  False labor  P: Discharge home Labor precautions and kick counts included in AVS Patient to follow-up with [facility] as scheduled  Patient may return to MAU as needed or when in labor   Sharyon Cable, CNM 03/25/2019 7:57 AM

## 2019-03-25 NOTE — MAU Note (Signed)
Lost her mucous plug and had some blood in it.  Contractions are every 5 minutes since 0500.  No complications w/ pregnancy.  No LOF.  + FM.

## 2019-03-26 ENCOUNTER — Ambulatory Visit (INDEPENDENT_AMBULATORY_CARE_PROVIDER_SITE_OTHER): Payer: BLUE CROSS/BLUE SHIELD

## 2019-03-26 DIAGNOSIS — Z3402 Encounter for supervision of normal first pregnancy, second trimester: Secondary | ICD-10-CM

## 2019-03-26 DIAGNOSIS — Z3403 Encounter for supervision of normal first pregnancy, third trimester: Secondary | ICD-10-CM

## 2019-03-26 DIAGNOSIS — Z3A38 38 weeks gestation of pregnancy: Secondary | ICD-10-CM

## 2019-03-26 NOTE — Progress Notes (Signed)
   TELEHEALTH VIRTUAL OBSTETRICS VISIT ENCOUNTER NOTE  I connected with Teresa Harrington on 03/26/19 at 10:35 AM EDT by WebEx at home and verified that I am speaking with the correct person using two identifiers.   I discussed the limitations, risks, security and privacy concerns of performing an evaluation and management service by telephone and the availability of in person appointments. I also discussed with the patient that there may be a patient responsible charge related to this service. The patient expressed understanding and agreed to proceed.  Subjective:  Teresa Harrington is a 23 y.o. G1P0 at [redacted]w[redacted]d being followed for ongoing prenatal care.  She is currently monitored for the following issues for this low-risk pregnancy and has CONJUNCTIVITIS, VIRAL, ACUTE; VISUAL ACUITY, DECREASED, RIGHT EYE; SORE THROAT; ASTHMA, UNSPECIFIED; and Encounter for supervision of normal first pregnancy in second trimester on their problem list.  Patient reports contractions since yesterday. Reports fetal movement. Denies any contractions, bleeding or leaking of fluid.   The following portions of the patient's history were reviewed and updated as appropriate: allergies, current medications, past family history, past medical history, past social history, past surgical history and problem list.   Objective:   General:  Alert, oriented and cooperative.   Mental Status: Normal mood and affect perceived. Normal judgment and thought content.  Rest of physical exam deferred due to type of encounter  Assessment and Plan:  Pregnancy: G1P0 at [redacted]w[redacted]d 1. Encounter for supervision of normal first pregnancy in second trimester -Patient unable to take BP now but will log in [employer] today. -Patient states contractions are the same as they were when she was seen in MAU yesterday. Labor precautions reviewed -Patient with questions about knowing when SROM occurs. States her sister didn't know it happened. Reviewed at length  with patient and when to come in. -Next visit in office for postdates testing and to schedule IOL  Term labor symptoms and general obstetric precautions including but not limited to vaginal bleeding, contractions, leaking of fluid and fetal movement were reviewed in detail with the patient.  I discussed the assessment and treatment plan with the patient. The patient was provided an opportunity to ask questions and all were answered. The patient agreed with the plan and demonstrated an understanding of the instructions. The patient was advised to call back or seek an in-person office evaluation/go to MAU at [facility] for any urgent or concerning symptoms. Please refer to After Visit Summary for other counseling recommendations.   I provided 10 minutes of non-face-to-face time during this encounter.  Return in about 8 days (around 04/03/2019) for Return OB visit.  Rolm Bookbinder, CNM [facility] for [employer], [facility]

## 2019-03-31 ENCOUNTER — Telehealth: Payer: Self-pay | Admitting: Obstetrics & Gynecology

## 2019-03-31 NOTE — Telephone Encounter (Signed)
Called the patient to confirm the appointment, the patient verbalized understanding. °

## 2019-04-01 ENCOUNTER — Inpatient Hospital Stay (HOSPITAL_COMMUNITY)
Admission: AD | Admit: 2019-04-01 | Discharge: 2019-04-03 | DRG: 807 | Disposition: A | Discharge status: Home / Self Care | Payer: BLUE CROSS/BLUE SHIELD | Attending: Obstetrics and Gynecology | Admitting: Obstetrics and Gynecology

## 2019-04-01 ENCOUNTER — Inpatient Hospital Stay (HOSPITAL_COMMUNITY): Payer: BLUE CROSS/BLUE SHIELD | Admitting: Anesthesiology

## 2019-04-01 ENCOUNTER — Encounter (HOSPITAL_COMMUNITY): Payer: Self-pay

## 2019-04-01 ENCOUNTER — Other Ambulatory Visit: Payer: Self-pay

## 2019-04-01 DIAGNOSIS — O26893 Other specified pregnancy related conditions, third trimester: Secondary | ICD-10-CM | POA: Diagnosis present

## 2019-04-01 DIAGNOSIS — Z3A39 39 weeks gestation of pregnancy: Secondary | ICD-10-CM

## 2019-04-01 DIAGNOSIS — Z1159 Encounter for screening for other viral diseases: Secondary | ICD-10-CM

## 2019-04-01 DIAGNOSIS — Z3402 Encounter for supervision of normal first pregnancy, second trimester: Secondary | ICD-10-CM

## 2019-04-01 LAB — CBC
HCT: 43.1 % (ref 36.0–46.0)
Hemoglobin: 14.8 g/dL (ref 12.0–15.0)
MCH: 31.2 pg (ref 26.0–34.0)
MCHC: 34.3 g/dL (ref 30.0–36.0)
MCV: 90.7 fL (ref 80.0–100.0)
Platelets: 234 10*3/uL (ref 150–400)
RBC: 4.75 MIL/uL (ref 3.87–5.11)
RDW: 12.3 % (ref 11.5–15.5)
WBC: 7.8 10*3/uL (ref 4.0–10.5)
nRBC: 0 % (ref 0.0–0.2)

## 2019-04-01 LAB — SARS CORONAVIRUS 2 BY RT PCR (HOSPITAL ORDER, PERFORMED IN ~~LOC~~ HOSPITAL LAB): SARS Coronavirus 2: NEGATIVE

## 2019-04-01 LAB — RPR: RPR Ser Ql: NONREACTIVE

## 2019-04-01 LAB — TYPE AND SCREEN
ABO/RH(D): O POS
Antibody Screen: NEGATIVE

## 2019-04-01 LAB — ABO/RH: ABO/RH(D): O POS

## 2019-04-01 MED ORDER — EPHEDRINE 5 MG/ML INJ: 10.0000 mg | INTRAVENOUS | Status: DC | PRN

## 2019-04-01 MED ORDER — SIMETHICONE 80 MG PO CHEW: 80.0000 mg | CHEWABLE_TABLET | ORAL | Status: DC | PRN

## 2019-04-01 MED ORDER — DIPHENHYDRAMINE HCL 50 MG/ML IJ SOLN: 12.5000 mg | INTRAMUSCULAR | Status: DC | PRN

## 2019-04-01 MED ORDER — ZOLPIDEM TARTRATE 5 MG PO TABS: 5.0000 mg | ORAL_TABLET | Freq: Every evening | ORAL | Status: DC | PRN

## 2019-04-01 MED ORDER — FENTANYL CITRATE (PF) 100 MCG/2ML IJ SOLN
100.0000 ug | INTRAMUSCULAR | Status: DC | PRN
  Administered 2019-04-01 (×2): 100 ug via INTRAVENOUS
  Filled 2019-04-01 (×2): qty 2

## 2019-04-01 MED ORDER — OXYTOCIN 40 UNITS IN NORMAL SALINE INFUSION - SIMPLE MED: 2.5000 [IU]/h | INTRAVENOUS | Status: DC

## 2019-04-01 MED ORDER — TERBUTALINE SULFATE 1 MG/ML IJ SOLN: 0.2500 mg | Freq: Once | INTRAMUSCULAR | Status: DC | PRN

## 2019-04-01 MED ORDER — COCONUT OIL OIL: 1.0000 "application " | TOPICAL_OIL | Status: DC | PRN

## 2019-04-01 MED ORDER — LIDOCAINE HCL (PF) 1 % IJ SOLN: 30.0000 mL | INTRAMUSCULAR | Status: DC | PRN

## 2019-04-01 MED ORDER — PHENYLEPHRINE 40 MCG/ML (10ML) SYRINGE FOR IV PUSH (FOR BLOOD PRESSURE SUPPORT): 80.0000 ug | PREFILLED_SYRINGE | INTRAVENOUS | Status: DC | PRN

## 2019-04-01 MED ORDER — PRENATAL MULTIVITAMIN CH
1.0000 | ORAL_TABLET | Freq: Every day | ORAL | Status: DC
  Administered 2019-04-02 – 2019-04-03 (×2): 1 via ORAL
  Filled 2019-04-01 (×2): qty 1

## 2019-04-01 MED ORDER — LIDOCAINE HCL (PF) 1 % IJ SOLN
INTRAMUSCULAR | Status: DC | PRN
  Administered 2019-04-01: 5 mL
  Administered 2019-04-01: 5 mL via EPIDURAL

## 2019-04-01 MED ORDER — ACETAMINOPHEN 325 MG PO TABS: 650.0000 mg | ORAL_TABLET | ORAL | Status: DC | PRN

## 2019-04-01 MED ORDER — OXYTOCIN 40 UNITS IN NORMAL SALINE INFUSION - SIMPLE MED
1.0000 m[IU]/min | INTRAVENOUS | Status: DC
  Administered 2019-04-01: 17:00:00 1 m[IU]/min via INTRAVENOUS
  Administered 2019-04-01: 13:00:00 2 m[IU]/min via INTRAVENOUS
  Filled 2019-04-01: qty 1000

## 2019-04-01 MED ORDER — LACTATED RINGERS IV SOLN
500.0000 mL | Freq: Once | INTRAVENOUS | Status: AC
  Administered 2019-04-01: 14:00:00 500 mL via INTRAVENOUS

## 2019-04-01 MED ORDER — SODIUM CHLORIDE (PF) 0.9 % IJ SOLN
INTRAMUSCULAR | Status: DC | PRN
  Administered 2019-04-01: 12 mL/h via EPIDURAL

## 2019-04-01 MED ORDER — ONDANSETRON HCL 4 MG PO TABS: 4.0000 mg | ORAL_TABLET | ORAL | Status: DC | PRN

## 2019-04-01 MED ORDER — ONDANSETRON HCL 4 MG/2ML IJ SOLN
4.0000 mg | Freq: Four times a day (QID) | INTRAMUSCULAR | Status: DC | PRN
  Administered 2019-04-01: 4 mg via INTRAVENOUS
  Filled 2019-04-01: qty 2

## 2019-04-01 MED ORDER — OXYTOCIN BOLUS FROM INFUSION
500.0000 mL | Freq: Once | INTRAVENOUS | Status: AC
  Administered 2019-04-01: 20:00:00 500 mL via INTRAVENOUS

## 2019-04-01 MED ORDER — DIPHENHYDRAMINE HCL 25 MG PO CAPS: 25.0000 mg | ORAL_CAPSULE | Freq: Four times a day (QID) | ORAL | Status: DC | PRN

## 2019-04-01 MED ORDER — DIBUCAINE (PERIANAL) 1 % EX OINT: 1.0000 "application " | TOPICAL_OINTMENT | CUTANEOUS | Status: DC | PRN

## 2019-04-01 MED ORDER — WITCH HAZEL-GLYCERIN EX PADS: 1.0000 "application " | MEDICATED_PAD | CUTANEOUS | Status: DC | PRN

## 2019-04-01 MED ORDER — BENZOCAINE-MENTHOL 20-0.5 % EX AERO: 1.0000 "application " | INHALATION_SPRAY | CUTANEOUS | Status: DC | PRN

## 2019-04-01 MED ORDER — IBUPROFEN 600 MG PO TABS
600.0000 mg | ORAL_TABLET | Freq: Four times a day (QID) | ORAL | Status: DC
  Administered 2019-04-02 – 2019-04-03 (×7): 600 mg via ORAL
  Filled 2019-04-01 (×7): qty 1

## 2019-04-01 MED ORDER — FENTANYL-BUPIVACAINE-NACL 0.5-0.125-0.9 MG/250ML-% EP SOLN
12.0000 mL/h | EPIDURAL | Status: DC | PRN
  Filled 2019-04-01: qty 250

## 2019-04-01 MED ORDER — LACTATED RINGERS IV SOLN
500.0000 mL | INTRAVENOUS | Status: DC | PRN
  Administered 2019-04-01: 16:00:00 500 mL via INTRAVENOUS

## 2019-04-01 MED ORDER — ONDANSETRON HCL 4 MG/2ML IJ SOLN: 4.0000 mg | INTRAMUSCULAR | Status: DC | PRN

## 2019-04-01 MED ORDER — LACTATED RINGERS IV SOLN
INTRAVENOUS | Status: DC
  Administered 2019-04-01 (×5): via INTRAVENOUS

## 2019-04-01 MED ORDER — SOD CITRATE-CITRIC ACID 500-334 MG/5ML PO SOLN: 30.0000 mL | ORAL | Status: DC | PRN

## 2019-04-01 MED ORDER — TETANUS-DIPHTH-ACELL PERTUSSIS 5-2.5-18.5 LF-MCG/0.5 IM SUSP: 0.5000 mL | Freq: Once | INTRAMUSCULAR | Status: DC

## 2019-04-01 MED ORDER — SENNOSIDES-DOCUSATE SODIUM 8.6-50 MG PO TABS
2.0000 | ORAL_TABLET | ORAL | Status: DC
  Administered 2019-04-02 (×2): 2 via ORAL
  Filled 2019-04-01 (×2): qty 2

## 2019-04-01 MED ORDER — LACTATED RINGERS IV SOLN
500.0000 mL | Freq: Once | INTRAVENOUS | Status: AC
  Administered 2019-04-01: 11:00:00 500 mL via INTRAVENOUS

## 2019-04-01 NOTE — MAU Note (Signed)
Pt c/o contractions that have been ongoing since weekend, but worse since 2100 last night. Denies LOF or bleeding. Lost mucous plug. Decreased FM.

## 2019-04-01 NOTE — Progress Notes (Signed)
Patient ID: [name], female   DOB: [birth year], 23 y.o.   MRN: [id number] Labor Progress Note [name] is a 23 y.o. G1P0 at [redacted]w[redacted]d presented for SOL  S: s/p epidural feeling well and comfortable  O:  BP (!) 106/57   Pulse (!) 109   Temp 98.9 F (37.2 C) (Oral)   Resp 18   Ht 5\' 5"  (1.651 m)   Wt 84.9 kg   LMP 06/20/2018 (Exact Date)   SpO2 98%   BMI 31.14 kg/m  EFM: 130/mod/+accels, no decels  CVE: Dilation: 6 Effacement (%): 90, 100 Cervical Position: Middle Station: -1 Presentation: Vertex Exam by:: K.[place], RN    A&P: 23 y.o. G1P0 [redacted]w[redacted]d SOL #Labor: Progressing well. Ctx have stopped after epidural. Will assess and potentially start pitocin.  #Pain: epidural in place. #FWB: Cat I #GBS negative  Federico Flake, MD 11:57 AM

## 2019-04-01 NOTE — Anesthesia Preprocedure Evaluation (Signed)
Anesthesia Evaluation  Patient identified by MRN, date of birth, ID band Patient awake    Reviewed: Allergy & Precautions, H&P , NPO status , Patient's Chart, lab work & pertinent test results  Airway Mallampati: II   Neck ROM: full    Dental   Pulmonary neg pulmonary ROS,    breath sounds clear to auscultation       Cardiovascular negative cardio ROS   Rhythm:regular Rate:Normal     Neuro/Psych    GI/Hepatic   Endo/Other    Renal/GU      Musculoskeletal   Abdominal   Peds  Hematology   Anesthesia Other Findings   Reproductive/Obstetrics (+) Pregnancy                             Anesthesia Physical Anesthesia Plan  ASA: I  Anesthesia Plan: Epidural   Post-op Pain Management:    Induction: Intravenous  PONV Risk Score and Plan: 2 and Treatment may vary due to age or medical condition  Airway Management Planned: Natural Airway  Additional Equipment:   Intra-op Plan:   Post-operative Plan:   Informed Consent: I have reviewed the patients History and Physical, chart, labs and discussed the procedure including the risks, benefits and alternatives for the proposed anesthesia with the patient or authorized representative who has indicated his/her understanding and acceptance.       Plan Discussed with: Anesthesiologist  Anesthesia Plan Comments:         Anesthesia Quick Evaluation  

## 2019-04-01 NOTE — Progress Notes (Signed)
Dr Alvester Morin discussed risks/benefits of using vacuum for delivery. Pt & family agreeable & understanding.

## 2019-04-01 NOTE — Discharge Summary (Signed)
Postpartum Discharge Summary     Patient Name: Teresa Harrington DOB: 1996/10/23 MRN: [id number]  Date of admission: 04/01/2019 Delivering Provider: Federico Flake   Date of discharge: 04/03/2019  Admitting diagnosis: 39 wks, ctx Intrauterine pregnancy: [redacted]w[redacted]d     Secondary diagnosis:  Principal Problem:   Vacuum extractor delivery, delivered Active Problems:   Encounter for supervision of normal first pregnancy in second trimester   Indication for care in labor or delivery  Additional problems: none     Discharge diagnosis: Term Pregnancy Delivered                                                                                                Post partum procedures:na  Augmentation: AROM and Pitocin  Complications: None  Hospital course:  Onset of Labor With Vaginal Delivery     23 y.o. yo G1P1001 at [redacted]w[redacted]d was admitted in Active Labor on 04/01/2019. Patient had a labor complicated by OP position with deep variables necessitating vacuum delivery. She did not develop a fever during labor but felt internally warm and infant was warm on delivery. Labor course as follows:  Membrane Rupture Time/Date: 6:32 AM ,04/01/2019   Intrapartum Procedures: Episiotomy: None [1]                                         Lacerations:  None [1]  Patient had a delivery of a Viable infant. 04/01/2019  Information for the patient's newborn:  Concepcion, Mcgurrin [[id number]]  Delivery Method: Vag-Spont    Pateint had an uncomplicated postpartum course.  She is ambulating, tolerating a regular diet, passing flatus, and urinating well. Patient is discharged home in stable condition on 04/03/19.   Magnesium Sulfate recieved: No BMZ received: No  Physical exam  Vitals:   04/02/19 1000 04/02/19 1432 04/02/19 2215 04/03/19 0542  BP: 124/75 125/65 118/63 117/67  Pulse: 68 84 74 61  Resp: 16  18 18   Temp: 98.9 F (37.2 C) 98.2 F (36.8 C) 98.4 F (36.9 C) 98.7 F (37.1 C)  TempSrc: Oral   Oral Oral  SpO2:  100% 98%   Weight:      Height:       General: alert, cooperative and no distress Lochia: appropriate Uterine Fundus: firm Incision: N/A DVT Evaluation: No significant calf/ankle edema. Labs: Lab Results  Component Value Date   WBC 7.8 04/01/2019   HGB 14.8 04/01/2019   HCT 43.1 04/01/2019   MCV 90.7 04/01/2019   PLT 234 04/01/2019   CMP Latest Ref Rng & Units 10/10/2018  Glucose 70 - 99 mg/dL 82  BUN 6 - 20 mg/dL 8  Creatinine 5.37 - 9.43 mg/dL 2.76  Sodium 147 - 092 mmol/L 135  Potassium 3.5 - 5.1 mmol/L 3.6  Chloride 98 - 111 mmol/L 103  CO2 22 - 32 mmol/L 23  Calcium 8.9 - 10.3 mg/dL 9.5(F)  Total Protein 6.5 - 8.1 g/dL 7.1  Total Bilirubin 0.3 - 1.2 mg/dL 0.5  Alkaline Phos 38 -  126 U/L 51  AST 15 - 41 U/L 12(L)  ALT 0 - 44 U/L 15    Discharge instruction: per After Visit Summary and "Baby and Me Booklet".  After visit meds:  Allergies as of 04/03/2019      Reactions   Tramadol Hives   Other Hives   AGENT: Celery       Medication List    TAKE these medications   Comfort Fit Maternity Supp Sm Misc 1 Units by Does not apply route daily as needed.   ondansetron 4 MG disintegrating tablet Commonly known as:  Zofran ODT Take 1 tablet (4 mg total) by mouth every 8 (eight) hours as needed for nausea or vomiting.   Prenatal Complete 14-0.4 MG Tabs Take 1 tablet by mouth daily.       Diet: routine diet  Activity: Advance as tolerated. Pelvic rest for 6 weeks.   Outpatient follow up:4 weeks Follow up Appt: No future appointments. Follow up Visit: Follow-up Information    [facility] Schedule an appointment as soon as possible for a visit in 4 week(s).   Specialty:  Obstetrics and Gynecology Why:  Make appointment to be seen in 4 weeks for postpartum care  Contact information: [address], [address] [id number] [place] [phone]           Please schedule this patient for Postpartum visit in: 6 weeks with the following provider: Any provider  For C/S patients schedule nurse incision check in weeks 2 weeks: NA  Low risk pregnancy complicated by: none  Delivery mode: Vacuum  Anticipated Birth Control: Nexplanon  PP Procedures needed: PP PAP   Schedule Integrated BH visit: no   Newborn Data: Live born female  Birth Weight: 6 lb 6.7 oz (2910 g) APGAR: 5, 7  Newborn Delivery   Birth date/time:  04/01/2019 19:44:00 Delivery type:  Vaginal, Vacuum (Extractor)     Baby Feeding: Breast Disposition:home with mother   04/03/2019 Sharyon CableVeronica C Susan Bleich, CNM

## 2019-04-01 NOTE — Anesthesia Procedure Notes (Signed)
Epidural Patient location during procedure: OB Start time: 04/01/2019 11:16 AM End time: 04/01/2019 11:26 AM  Staffing Anesthesiologist: Achille Rich, MD Performed: anesthesiologist   Preanesthetic Checklist Completed: patient identified, site marked, pre-op evaluation, timeout performed, IV checked, risks and benefits discussed and monitors and equipment checked  Epidural Patient position: sitting Prep: DuraPrep Patient monitoring: heart rate, cardiac monitor, continuous pulse ox and blood pressure Approach: midline Location: L2-L3 Injection technique: LOR saline  Needle:  Needle type: Tuohy  Needle gauge: 17 G Needle length: 9 cm Needle insertion depth: 5 cm Catheter type: closed end flexible Catheter size: 19 Gauge Catheter at skin depth: 12 cm Test dose: negative and Other  Assessment Events: blood not aspirated, injection not painful, no injection resistance and negative IV test  Additional Notes Informed consent obtained prior to proceeding including risk of failure, 1% risk of PDPH, risk of minor discomfort and bruising.  Discussed rare but serious complications including epidural abscess, permanent nerve injury, epidural hematoma.  Discussed alternatives to epidural analgesia and patient desires to proceed.  Timeout performed pre-procedure verifying patient name, procedure, and platelet count.  Patient tolerated procedure well. Reason for block:procedure for pain

## 2019-04-01 NOTE — Consult Note (Signed)
[facility]  Delivery Note:  SVD    04/01/2019  7:34 PM  I was called to the delivery room at the request of the patient's obstetrician (Dr. Jolayne Panther) for vacuum extraction in the setting of late decelerations.  PRENATAL HX:  This is a 23 y/o G1P0 at 1 and 5/[redacted] weeks gestation who was admitted in labor.  Her pregnancy has been uncomplicated.  Delivery complicated by moderate meconium stained amniotic fluid and late decelerations.  Delivery assisted by vacuum extraction.  She is GBS negative with AROM x 13 hours.    DELIVERY:  Infant initially with poor tone at delivery, so cord clamping not delayed.  HR was ~ 100 upon arrival to warmer and she responded well to standard warming, drying and stimulation.  A pulse oximeter was applied and O2 saturations were in mid 50s at 3 minutes.  Blow by O2 administered from 3 minutes to 6 minutes of age, then O2 saturations remained in 90s in RA.  APGARs 5, 7 and 9.  Exam notable for molding and caput, otherwise was within normal limits.  After 10 minutes, baby left with nurse to assist parents with skin-to-skin care.   _____________________ Electronically Signed By: Maryan Char, MD Neonatologist

## 2019-04-01 NOTE — Progress Notes (Signed)
Patient ID: Teresa Harrington, female   DOB: 07-20-1996, 23 y.o.   MRN: [id number]  Labor Progress Note Teresa Harrington is a 23 y.o. G1P0 at [redacted]w[redacted]d presented for SOL  S: called to room by RN for prolonged decel  O:  BP (!) 119/59   Pulse 85   Temp 98.6 F (37 C) (Oral)   Resp 16   Ht 5\' 5"  (1.651 m)   Wt 84.9 kg   LMP 06/20/2018 (Exact Date)   SpO2 100%   BMI 31.14 kg/m  EFM: 125/mod/ prolonged decel, lasting ~4 minutes, returned to baseline spontaneously with position changes  CVE: Dilation: 9 Effacement (%): 100 Cervical Position: Middle Station: 0, Plus 1 Presentation: Vertex Exam by:: L.Leftwich-Kirby, CNM   A&P: 23 y.o. G1P0 [redacted]w[redacted]d in SOL #Labor: Progressing well.  #Pain: s/p epidural #FWB: Cat 2-- reassuring, returned to baseline with position changes. EFM currently with accels.  #GBS negative  Federico Flake, MD 4:34 PM

## 2019-04-01 NOTE — Progress Notes (Signed)
Teresa Harrington is a 23 y.o. G1P0 at [redacted]w[redacted]d admitted for active labor  Subjective: Pt comfortable with epidural. S/O in room for support.  Objective: BP 114/71   Pulse 96   Temp 98.9 F (37.2 C) (Oral)   Resp 16   Ht 5\' 5"  (1.651 m)   Wt 84.9 kg   LMP 06/20/2018 (Exact Date)   SpO2 98%   BMI 31.14 kg/m  No intake/output data recorded. No intake/output data recorded.  FHT:  FHR: 145 bpm, variability: moderate,  accelerations:  Present,  decelerations:  Present Early decels vs late decels with 1 prolonged decel lasting 3 minutes down to 100.  Difficult to assess decels since contractions are not tracing on TOCO. UC:   regular, every 2-3 minutes, palpated with pt sidelying, unable to trace with TOCO SVE:   Dilation: Lip/rim Effacement (%): 100 Station: 0, Plus 1 Exam by:: L.Leftwich-Kirby, CNM  IUPC placed without difficulty. Pt tolerated well.   Labs: Lab Results  Component Value Date   WBC 7.8 04/01/2019   HGB 14.8 04/01/2019   HCT 43.1 04/01/2019   MCV 90.7 04/01/2019   PLT 234 04/01/2019    Assessment / Plan: Augmentation of labor, progressing well  Labor: Progressing normally Preeclampsia:  n/a Fetal Wellbeing:  Category II Pain Control:  Epidural I/D:  GBS neg Anticipated MOD:  NSVD  Sharen Counter 04/01/2019, 3:35 PM

## 2019-04-01 NOTE — H&P (Signed)
OBSTETRIC ADMISSION HISTORY AND PHYSICAL  Teresa Harrington is a 23 y.o. female G1P0 with IUP at 6026w5d presenting for labor. She reports +FMs. No LOF, VB, blurry vision, headaches, peripheral edema, or RUQ pain. She plans on breastfeeding. She requests Nexplanon for birth control.  Dating: By 12 wk [place] --->  Estimated Date of Delivery: 04/03/19  Sono:    @[redacted]w[redacted]d , CWD, normal anatomy, breech presentation, 2262g, 59%ile, EFW 5'   Prenatal History/Complications: -transfer from [facility] at 24 wks  Past Medical History: Past Medical History:  Diagnosis Date  . Medical history non-contributory     Past Surgical History: Past Surgical History:  Procedure Laterality Date  . NO PAST SURGERIES      Obstetrical History: OB History    Gravida  1   Para      Term      Preterm      AB      Living  0     SAB      TAB      Ectopic      Multiple      Live Births              Social History: Social History   Socioeconomic History  . Marital status: Single    Spouse name: Not on file  . Number of children: Not on file  . Years of education: Not on file  . Highest education level: Not on file  Occupational History  . Not on file  Social Needs  . Financial resource strain: Not on file  . Food insecurity:    Worry: Never true    Inability: Never true  . Transportation needs:    Medical: No    Non-medical: No  Tobacco Use  . Smoking status: Never Smoker  . Smokeless tobacco: Never Used  Substance and Sexual Activity  . Alcohol use: No  . Drug use: No  . Sexual activity: Yes    Birth control/protection: Condom  Lifestyle  . Physical activity:    Days per week: Not on file    Minutes per session: Not on file  . Stress: Not on file  Relationships  . Social connections:    Talks on phone: Not on file    Gets together: Not on file    Attends religious service: Not on file    Active member of club or organization: Not on file    Attends meetings  of clubs or organizations: Not on file    Relationship status: Not on file  Other Topics Concern  . Not on file  Social History Narrative  . Not on file    Family History: Family History  Problem Relation Age of Onset  . Healthy Mother   . Healthy Father     Allergies: Allergies  Allergen Reactions  . Tramadol Hives  . Other Hives    AGENT: Celery     Medications Prior to Admission  Medication Sig Dispense Refill Last Dose  . Prenatal Vit-Fe Fumarate-FA (PRENATAL COMPLETE) 14-0.4 MG TABS Take 1 tablet by mouth daily. 60 each 1 03/31/2019 at Unknown time  . Elastic Bandages & Supports (COMFORT FIT MATERNITY SUPP SM) MISC 1 Units by Does not apply route daily as needed. (Patient not taking: Reported on 03/26/2019) 1 each 0 Not Taking  . ondansetron (ZOFRAN ODT) 4 MG disintegrating tablet Take 1 tablet (4 mg total) by mouth every 8 (eight) hours as needed for nausea or vomiting. (Patient not taking: Reported  on 03/19/2019) 15 tablet 0 Not Taking     Review of Systems   All systems reviewed and negative except as stated in HPI  Blood pressure 126/84, pulse 84, temperature 97.9 F (36.6 C), temperature source Oral, resp. rate 18, height 5\' 5"  (1.651 m), weight 84.9 kg, last menstrual period 06/20/2018, SpO2 100 %. General appearance: alert, cooperative and no distress Lungs: regular rate and effort Heart: regular rate  Abdomen: soft, non-tender Extremities: Homans sign is negative, no sign of DVT Fetal monitoringBaseline: 135 bpm, Variability: Good {> 6 bpm), Accelerations: Reactive and Decelerations: Absent Uterine activity q3-5 Dilation: 4 Effacement (%): 90 Station: -2 Exam by:: Bari Mantis RN   Prenatal labs: ABO, Rh: O/Positive/-- (10/25 0000) Antibody: Negative (10/25 0000) Rubella: Immune (10/25 0000) RPR: Non Reactive (02/18 0850)  HBsAg: Negative (10/25 0000)  HIV: Non Reactive (02/18 0850)  GBS:  neg 2 hr GTT normal  Prenatal Transfer Tool  Maternal Diabetes:  No Genetic Screening: Normal Maternal Ultrasounds/Referrals: Normal Fetal Ultrasounds or other Referrals:  None Maternal Substance Abuse:  No Significant Maternal Medications:  None Significant Maternal Lab Results: None  No results found for this or any previous visit (from the past 24 hour(s)).  Patient Active Problem List   Diagnosis Date Noted  . Encounter for supervision of normal first pregnancy in second trimester 12/17/2018  . CONJUNCTIVITIS, VIRAL, ACUTE 09/29/2008  . VISUAL ACUITY, DECREASED, RIGHT EYE 12/27/2007  . SORE THROAT 12/27/2007  . ASTHMA, UNSPECIFIED 01/17/2007    Assessment: Dieu Cavallo is a 23 y.o. G1P0 at [redacted]w[redacted]d here for labor  1. Labor: early active 2. FWB: Cat I 3. Pain: analgesia/anesthesia prn 4. GBS: neg   Plan: Admit to LD Expectant mngt  Donette Larry, CNM  04/01/2019, 2:10 AM

## 2019-04-01 NOTE — Progress Notes (Signed)
Labor Progress Note Charnise Stepney is a 23 y.o. G1P0 at [redacted]w[redacted]d presented for labor  S:  Breathing with ctx, declines pain meds.  O:  BP (!) 145/81   Pulse 86   Temp 98.7 F (37.1 C) (Oral)   Resp 18   Ht 5\' 5"  (1.651 m)   Wt 84.9 kg   LMP 06/20/2018 (Exact Date)   SpO2 100%   BMI 31.14 kg/m  EFM: baseline 135 bpm/ mod variability/ + accels/ no decels  Toco: 3-4 SVE: 5/90/-1 AROM- thin MSF Bedside US: vtx  A/P: 23 y.o. G1P0 [redacted]w[redacted]d  1. Labor: protracted 2. FWB: Cat I 3. Pain: analgesia/anesthesia prn  Augment with AROM. Anticipate labor progression and SVD.  Donette Larry, CNM 6:58 AM

## 2019-04-02 ENCOUNTER — Encounter: Payer: BLUE CROSS/BLUE SHIELD | Admitting: Obstetrics & Gynecology

## 2019-04-02 ENCOUNTER — Other Ambulatory Visit: Payer: BLUE CROSS/BLUE SHIELD

## 2019-04-02 NOTE — Lactation Note (Signed)
This note was copied from a baby's chart. Lactation Consultation Note  Patient Name: Teresa Harrington [id number] Date: 04/02/2019 Reason for consult: Follow-up assessment;Mother's request  2057 - 2110 - Ms. Wierenga paged. She expressed that she is still concerned about baby transferring milk through the nipple shield, and she is concerned that baby is not getting enough to eat. I asked her what she felt comfortable doing in terms of feeding baby, and she indicated that she preferred to breast feed. I offered to assist with position and latch.  We placed baby in cross cradle hold on mom's right breast. Mom was initially using a cradle hold and compressing breast in the opposite direction of baby's mouth. I showed mom how to compress breast in a "U" hold. Baby latched without use of a nipple shield and breast fed with rhythmic suckling sequences. I noted good breast tissue movement.  Mom states this is her best feeding. I provided encouragement to mom to continue to feed on demand and to do lots of skin to skin. I recommended that if she did choose to provide formula tonight to also use her DEBP for breast stimulation. Mom verbalized understanding.   Feeding Feeding Type: Breast Fed  LATCH Score Latch: Grasps breast easily, tongue down, lips flanged, rhythmical sucking.  Audible Swallowing: A few with stimulation  Type of Nipple: Everted at rest and after stimulation  Comfort (Breast/Nipple): Soft / non-tender  Hold (Positioning): Assistance needed to correctly position infant at breast and maintain latch.  LATCH Score: 8  Interventions Interventions: Assisted with latch;Hand express;Breast compression;Adjust position;Support pillows  Lactation Tools Discussed/Used     Consult Status Consult Status: Follow-up Date: 04/03/19 Follow-up type: In-patient    Walker Shadow 04/02/2019, 9:14 PM

## 2019-04-02 NOTE — Progress Notes (Signed)
Post Partum Day # 1 VAVD Subjective: Ms Czarnik is without complaints this morning. Ambulating and voiding without problems. Tolerating diet. Pain controlled. Breast/Bottle feeding  Objective: Blood pressure 103/61, pulse 62, temperature 97.9 F (36.6 C), temperature source Oral, resp. rate 16, height 5\' 5"  (1.651 m), weight 84.9 kg, last menstrual period 06/20/2018, SpO2 99 %, unknown if currently breastfeeding.  Physical Exam:  General: alert Lochia: appropriate Uterine Fundus: firm Incision: NA DVT Evaluation: No evidence of DVT seen on physical exam.  Recent Labs    04/01/19 0220  HGB 14.8  HCT 43.1    Assessment/Plan: Stable. Continue with supportive care. Plan discharge tomorrow.   LOS: 1 day   Hermina Staggers 04/02/2019, 7:19 AM

## 2019-04-02 NOTE — Anesthesia Postprocedure Evaluation (Signed)
Anesthesia Post Note  Patient: Teresa Harrington  Procedure(s) Performed: AN AD HOC LABOR EPIDURAL     Patient location during evaluation: Mother Baby Anesthesia Type: Epidural Level of consciousness: awake and alert Pain management: pain level controlled Vital Signs Assessment: post-procedure vital signs reviewed and stable Respiratory status: spontaneous breathing, nonlabored ventilation and respiratory function stable Cardiovascular status: stable Postop Assessment: no headache, no backache, epidural receding, no apparent nausea or vomiting, patient able to bend at knees, able to ambulate and adequate PO intake Anesthetic complications: no    Last Vitals:  Vitals:   04/02/19 0537 04/02/19 1000  BP: 103/61 124/75  Pulse: 62 68  Resp: 16 16  Temp: 36.6 C 37.2 C  SpO2:      Last Pain:  Vitals:   04/02/19 1000  TempSrc: Oral  PainSc: 0-No pain   Pain Goal:                   [place]

## 2019-04-02 NOTE — Lactation Note (Signed)
This note was copied from a baby's chart. Lactation Consultation Note  Patient Name: Teresa Harrington [id number] Date: 04/02/2019 Reason for consult: Follow-up assessment;Mother's request  1700 - 1710 - I responded to a call from Ms. Bachus. She was holding her daughter in her lap, and baby was asleep upon entry. Mom states that's she's concerned baby is not getting enough to eat because she was sucking on her fingers post-feeding.  We discussed baby's normal feeding frequency, feeding duration, and cluster feeding norms in the first days of breast feeding. We discussed why babies need to suck and signs that baby is getting enough to eat.  Baby was quiet, and the RN assisted with swaddling baby and placing her in a bassinet. I encouraged mom to call as needed and to rest. I also encouraged mom that we would support her decision to feed baby as she feels comfortable, and I also reinforced feeding norms.  Mom's breasts are in tact and WNL. I did not note any trauma. When baby last fed, we noted a droplet of colostrum in the nipple shield. Mom also states that baby has been sleeping off and on today, and I provided reassurance that baby would be feeding frequently this evening and tonight to offset some of those longer gaps in feedings today and to help stimulate mom's breasts for milk production. Mom seemed more relaxed after our conversation, and she indicated that she would call as needed.  I encouraged mom to feed baby 8-12 times a day, waking to feed as needed.  Maternal Data Does the patient have breastfeeding experience prior to this delivery?: No  Feeding Feeding Type: Breast Fed  LATCH Score Latch: Grasps breast easily, tongue down, lips flanged, rhythmical sucking.  Audible Swallowing: A few with stimulation  Type of Nipple: Everted at rest and after stimulation  Comfort (Breast/Nipple): Soft / non-tender  Hold (Positioning): Assistance needed to correctly position infant at  breast and maintain latch.  LATCH Score: 8  Interventions Interventions: Breast feeding basics reviewed(BF Education)  Lactation Tools Discussed/Used Tools: Nipple Shields Nipple shield size: 20 Pump Review: Setup, frequency, and cleaning   Consult Status Consult Status: Follow-up Date: 04/03/19 Follow-up type: In-patient    Walker Shadow 04/02/2019, 5:25 PM

## 2019-04-02 NOTE — Lactation Note (Signed)
This note was copied from a baby's chart. Lactation Consultation Note  Patient Name: Teresa Harrington IRCVE'[id number] Date: 04/02/2019 Reason for consult: Initial assessment;Difficult latch;Primapara;Term Baby is 14 hours and having difficulty with latching.  Baby is awake and showing signs of hunger.  Assisted with positioning baby in football hold on left side.  Mom has erect nipples but they are short.  Attempted latching baby but baby latched very shallow.  20 mm nipple shield applied.  Baby latched easily and deep.  Observed feeding for 15 minutes and baby still feeding when I left the room.  A few swallows noted.  Instructed on waking techniques and breast massage.  Symphony pump set up and will be initiated after feeding.  Instructed to post pump every 3 hours x 15 minutes for stimulation.  Maternal Data Has patient been taught Hand Expression?: Yes Does the patient have breastfeeding experience prior to this delivery?: No  Feeding Feeding Type: Breast Fed  LATCH Score Latch: Grasps breast easily, tongue down, lips flanged, rhythmical sucking.(with nipple shield)  Audible Swallowing: A few with stimulation  Type of Nipple: Everted at rest and after stimulation(short)  Comfort (Breast/Nipple): Soft / non-tender  Hold (Positioning): Assistance needed to correctly position infant at breast and maintain latch.  LATCH Score: 8  Interventions Interventions: Assisted with latch;Skin to skin;Breast compression;Shells;Adjust position;Breast massage;Support pillows;Hand express;DEBP  Lactation Tools Discussed/Used Tools: Shells;Nipple Shields Nipple shield size: 20 Initiated by:: LMoulden Date initiated:: 04/02/19   Consult Status Consult Status: Follow-up Date: 04/03/19 Follow-up type: In-patient    Huston Foley 04/02/2019, 10:32 AM

## 2019-04-02 NOTE — Lactation Note (Signed)
This note was copied from a baby's chart. Lactation Consultation Note  Patient Name: Teresa Harrington [id number]'F Date: 04/02/2019 Reason for consult: Follow-up assessment;Mother's request;Difficult latch;1st time breastfeeding;Term  1534 - 1548 - Ms. Michelsen paged for latch assistance. She was concerned that baby "Teresa Harrington" was not getting enough to eat. When I entered, mom was breast feeding baby in cradle hold on her left breast. Baby released the breast and mom's nipple shield fell off.  I showed mom how to properly place her nipple shield. Mom repeats back well. I added support pillows to help bring baby up to level of mom's breast. Baby latched with rhythmic suckling sequences, and mom denied pain.   I discussed how to know if baby is getting enough to eat, and I educated mom on night two cluster feeding patterns. I encouraged mom to call as needed, and I also reinforced earlier teaching to pump post feeds to support milk production. Mom verbalized understanding.   Maternal Data Does the patient have breastfeeding experience prior to this delivery?: No  Feeding Feeding Type: Breast Fed  LATCH Score Latch: Grasps breast easily, tongue down, lips flanged, rhythmical sucking.  Audible Swallowing: A few with stimulation  Type of Nipple: Everted at rest and after stimulation  Comfort (Breast/Nipple): Soft / non-tender  Hold (Positioning): Assistance needed to correctly position infant at breast and maintain latch.  LATCH Score: 8  Interventions Interventions: Assisted with latch;Support pillows  Lactation Tools Discussed/Used Tools: Nipple Shields Nipple shield size: 20 Pump Review: Setup, frequency, and cleaning   Consult Status Consult Status: Follow-up Date: 04/03/19 Follow-up type: In-patient    Walker Shadow 04/02/2019, 3:54 PM

## 2019-04-03 NOTE — Progress Notes (Signed)
CSW received consult due to score 11 on Edinburgh Depression Screen.    CSW went to speak with MOB at bedside. Upon entering the room CSW observed that curtain was closed. CSW saw MOB sitting upright and asked if it was okay for CSW to proceed into the room. MOB expressed that it was. As CSW walked fully into the room CSW observed that a gentleman was siting on the bed as well. CSW asked MOB if this was FOB? MOB expressed "yes". CSW congratulated MOB and FOB on the birth of infant. Both thanked CSW. CSW introduced role and advised MOB of the HIPPA policy. MOB expressed that FOB could remain in the room. CSW began assessment.   CSW advised MOB of the reason for the visit. MOB expressed that she was diagnosed with anxiety and depression while in high school. MOB advised CSW that she isn't on meds and never has been. MOB recall going to see a therapist in the past but not seeing one recently. CSW offered MOB outpatient mental health resources since scoring score of 11 on Edinburgh. MOB dec,ined being interested in therapy at this time. MOB reported that she is feeling fine with no further needs or concerns.  CSW provided education regarding Baby Blues vs PMADs and provided MOB with resources for mental health follow up.  CSW encouraged MOB to evaluate her mental health throughout the postpartum period with the use of the New Mom Checklist developed by Postpartum [employer] as well as the Edinburgh Postnatal Depression Scale and notify a medical professional if symptoms arise.      Kinsley Holderman S. Daryn Hicks, MSW, LCSW-A [facility]  [phone] 

## 2019-04-03 NOTE — Lactation Note (Signed)
This note was copied from a baby's chart. Lactation Consultation Note  Patient Name: Teresa Harrington [id number] Date: 04/03/2019    Mom called for assistance. "[name]" wants to suck her upper lip when latching which requires removing her and retrying. Once her lips are flanged properly, [name] does great at the breast (swallows verified by cervical auscultation).   Mom was shown how to assemble & use hand pump (single- & double-mode) that was included in pump kit. Since feeding at the breast is going well, Mom understands that she doesn't need to pump unless she & infant are separated or she becomes engorged/uncomfortably full & needs assist with draining her breasts. Mom was shown how to disconnect breast pump parts for washing.   Matthias Hughs [facility] 04/03/2019, 11:07 AM

## 2019-04-03 NOTE — Lactation Note (Signed)
This note was copied from a baby's chart. Lactation Consultation Note  Patient Name: Teresa Harrington [id number] Date: 04/03/2019   Infant is 63 hours old. Mom has not used nipple shield since yesterday. Mom is currently using the DEBP; size 24 flanges are appropriate for her at this time. Mom is getting some colostrum.   Mom says she is comfortable with latch, but will call me to observe next feeding. Last 2 LATCH scores noted to be 7.   Lurline Hare [facility] 04/03/2019, 10:32 AM

## 2019-04-07 ENCOUNTER — Ambulatory Visit (HOSPITAL_COMMUNITY)
Admission: EM | Admit: 2019-04-07 | Discharge: 2019-04-07 | Disposition: A | Discharge status: Home / Self Care | Payer: BLUE CROSS/BLUE SHIELD | Attending: Family Medicine | Admitting: Family Medicine

## 2019-04-07 ENCOUNTER — Encounter (HOSPITAL_COMMUNITY): Payer: Self-pay

## 2019-04-07 ENCOUNTER — Other Ambulatory Visit: Payer: Self-pay

## 2019-04-07 DIAGNOSIS — K602 Anal fissure, unspecified: Secondary | ICD-10-CM | POA: Diagnosis not present

## 2019-04-07 MED ORDER — FLUCONAZOLE 150 MG PO TABS: 150.0000 mg | ORAL_TABLET | Freq: Once | ORAL | 0 refills | Status: AC

## 2019-04-07 MED ORDER — HYDROCORTISONE (PERIANAL) 2.5 % EX CREA: 1.0000 "application " | TOPICAL_CREAM | Freq: Two times a day (BID) | CUTANEOUS | 0 refills | Status: DC

## 2019-04-07 NOTE — ED Triage Notes (Signed)
Patient presents to Urgent Care with complaints of constipation and painful BMs since giving vaginal birth 6 days ago. Patient reports she has been taking stool softeners since yesterday, BMs are very painful, but she did have a BM last night.

## 2019-04-07 NOTE — ED Provider Notes (Signed)
[facility]    CSN: [id number] Arrival date & time: 04/07/19  1018     History   Chief Complaint Chief Complaint  Patient presents with  . Constipation    HPI Teresa Harrington is a 23 y.o. female.   Patient presents to Urgent Care with complaints of constipation and painful BMs since giving vaginal birth 6 days ago. Patient reports she has been taking stool softeners since yesterday, BMs are very painful, but she did have a BM last night.   Patient notes some blood on tissue and very sensitive anal area.  Stools are soft having started a softener last night.     Past Medical History:  Diagnosis Date  . Medical history non-contributory     Patient Active Problem List   Diagnosis Date Noted  . Indication for care in labor or delivery 04/01/2019  . Vacuum extractor delivery, delivered 04/01/2019  . Encounter for supervision of normal first pregnancy in second trimester 12/17/2018  . CONJUNCTIVITIS, VIRAL, ACUTE 09/29/2008  . VISUAL ACUITY, DECREASED, RIGHT EYE 12/27/2007  . SORE THROAT 12/27/2007  . ASTHMA, UNSPECIFIED 01/17/2007    Past Surgical History:  Procedure Laterality Date  . NO PAST SURGERIES      OB History    Gravida  1   Para  1   Term  1   Preterm      AB      Living  1     SAB      TAB      Ectopic      Multiple  0   Live Births  1            Home Medications    Prior to Admission medications   Medication Sig Start Date End Date Taking? Authorizing Provider  fluconazole (DIFLUCAN) 150 MG tablet Take 1 tablet (150 mg total) by mouth once for 1 dose. Repeat if needed 04/07/19 04/07/19  Elvina Sidle, MD  hydrocortisone (ANUSOL-HC) 2.5 % rectal cream Place 1 application rectally 2 (two) times daily. 04/07/19   Elvina Sidle, MD  Prenatal Vit-Fe Fumarate-FA (PRENATAL COMPLETE) 14-0.4 MG TABS Take 1 tablet by mouth daily. 08/20/18   Janace Aris, NP    Family History Family History  Problem Relation Age of  Onset  . Healthy Mother   . Healthy Father     Social History Social History   Tobacco Use  . Smoking status: Never Smoker  . Smokeless tobacco: Never Used  Substance Use Topics  . Alcohol use: No  . Drug use: No     Allergies   Tramadol and Other   Review of Systems Review of Systems   Physical Exam Triage Vital Signs ED Triage Vitals  Enc Vitals Group     BP 04/07/19 1057 131/83     Pulse Rate 04/07/19 1057 81     Resp 04/07/19 1057 18     Temp 04/07/19 1057 98.1 F (36.7 C)     Temp Source 04/07/19 1057 Oral     SpO2 04/07/19 1057 100 %     Weight --      Height 04/07/19 1055 5\' 5"  (1.651 m)     Head Circumference --      Peak Flow --      Pain Score 04/07/19 1055 5     Pain Loc --      Pain Edu? --      Excl. in GC? --    No data found.  Updated Vital Signs BP 131/83 (BP Location: Right Arm)   Pulse 81   Temp 98.1 F (36.7 C) (Oral)   Resp 18   Ht 5\' 5"  (1.651 m)   LMP 06/20/2018 (Exact Date)   SpO2 100%   Breastfeeding Yes   BMI 31.14 kg/m    Physical Exam Vitals signs and nursing note reviewed.  Constitutional:      Appearance: Normal appearance.  HENT:     Head: Normocephalic.  Eyes:     Conjunctiva/sclera: Conjunctivae normal.  Neck:     Musculoskeletal: Normal range of motion and neck supple.  Pulmonary:     Effort: Pulmonary effort is normal.  Musculoskeletal: Normal range of motion.  Skin:    General: Skin is warm and dry.  Neurological:     General: No focal deficit present.     Mental Status: She is alert.      UC Treatments / Results  Labs (all labs ordered are listed, but only abnormal results are displayed) Labs Reviewed - No data to display  EKG None  Radiology No results found.  Procedures Procedures (including critical care time)  Medications Ordered in UC Medications - No data to display  Initial Impression / Assessment and Plan / UC Course  I have reviewed the triage vital signs and the nursing  notes.  Pertinent labs & imaging results that were available during my care of the patient were reviewed by me and considered in my medical decision making (see chart for details).    Final Clinical Impressions(s) / UC Diagnoses   Final diagnoses:  Anal fissure   Discharge Instructions   None    ED Prescriptions    Medication Sig Dispense Auth. Provider   hydrocortisone (ANUSOL-HC) 2.5 % rectal cream Place 1 application rectally 2 (two) times daily. 30 g Elvina SidleLauenstein, Naidelyn Parrella, MD   fluconazole (DIFLUCAN) 150 MG tablet Take 1 tablet (150 mg total) by mouth once for 1 dose. Repeat if needed 2 tablet Elvina SidleLauenstein, Keeghan Mcintire, MD     Controlled Substance Prescriptions [place] Controlled Substance Registry consulted? Not Applicable   Elvina SidleLauenstein, Maximilien Hayashi, MD 04/07/19 1113

## 2019-04-09 ENCOUNTER — Telehealth: Payer: Self-pay

## 2019-04-09 NOTE — Telephone Encounter (Signed)
Pt stated that she needed her FMLA paperwork filled out could someone call her back.

## 2019-04-10 ENCOUNTER — Telehealth (INDEPENDENT_AMBULATORY_CARE_PROVIDER_SITE_OTHER): Payer: BLUE CROSS/BLUE SHIELD | Admitting: *Deleted

## 2019-04-10 NOTE — Telephone Encounter (Signed)
Routed to admin pool

## 2019-04-10 NOTE — Telephone Encounter (Signed)
Returned pt's call.  Pt states she needs paperwork filled out for FMLA.  Advised pt that she needs to drop the paperwork off at the office and there is a $15 charge and it takes 7-10 business days to be completed.  Pt verbalized understanding.

## 2019-04-29 ENCOUNTER — Telehealth: Payer: Self-pay | Admitting: Family Medicine

## 2019-04-29 NOTE — Telephone Encounter (Signed)
Called and spoke to patient she was instructed to wear a face mask and no visitors are allowed at this time due to [place], patient has not been in contact with anyone with the Virus, patient also state she have no Symptoms.

## 2019-05-01 ENCOUNTER — Ambulatory Visit (INDEPENDENT_AMBULATORY_CARE_PROVIDER_SITE_OTHER): Payer: BC Managed Care – PPO | Admitting: Obstetrics & Gynecology

## 2019-05-01 ENCOUNTER — Other Ambulatory Visit: Payer: Self-pay

## 2019-05-01 DIAGNOSIS — Z30017 Encounter for initial prescription of implantable subdermal contraceptive: Secondary | ICD-10-CM

## 2019-05-01 LAB — POCT PREGNANCY, URINE: Preg Test, Ur: NEGATIVE

## 2019-05-01 MED ORDER — ETONOGESTREL 68 MG ~~LOC~~ IMPL
68.0000 mg | DRUG_IMPLANT | Freq: Once | SUBCUTANEOUS | Status: AC
  Administered 2019-05-01: 68 mg via SUBCUTANEOUS

## 2019-05-01 NOTE — Addendum Note (Signed)
Addended by: Fidela Juneau A on: 05/01/2019 03:55 PM   Modules accepted: Orders

## 2019-05-01 NOTE — Progress Notes (Signed)
GYNECOLOGY OFFICE PROCEDURE NOTE  Teresa Harrington is a 23 y.o. G1P1001 here for Nexplanon insertion. No other gynecologic concerns.  Nexplanon Insertion Procedure Patient identified, informed consent performed, consent signed.   Patient does understand that irregular bleeding is a very common side effect of this medication. She was advised to have backup contraception for one week after placement. Pregnancy test in clinic today was negative.  Appropriate time out taken.  Patient's left arm was prepped and draped in the usual sterile fashion. The ruler used to measure and mark insertion area.  Patient was prepped with alcohol swab and then injected with 3 ml of 1% lidocaine.  She was prepped with betadine, Nexplanon removed from packaging,  Device confirmed in needle, then inserted full length of needle and withdrawn per handbook instructions. Nexplanon was able to palpated in the patient's arm; patient palpated the insert herself. There was minimal blood loss.  Patient insertion site covered with gauze and a pressure bandage to reduce any bruising.  The patient tolerated the procedure well and was given post procedure instructions.   Woodroe Mode, MD Attending Obstetrician & Gynecologist, [facility]  05/01/2019

## 2019-05-01 NOTE — Patient Instructions (Signed)
Etonogestrel implant  What is this medicine?  ETONOGESTREL (et oh noe JES trel) is a contraceptive (birth control) device. It is used to prevent pregnancy. It can be used for up to 3 years.  This medicine may be used for other purposes; ask your health care provider or pharmacist if you have questions.  COMMON BRAND NAME(S): Implanon, Nexplanon  What should I tell my health care provider before I take this medicine?  They need to know if you have any of these conditions:  -abnormal vaginal bleeding  -blood vessel disease or blood clots  -breast, cervical, endometrial, ovarian, liver, or uterine cancer  -diabetes  -gallbladder disease  -heart disease or recent heart attack  -high blood pressure  -high cholesterol or triglycerides  -kidney disease  -liver disease  -migraine headaches  -seizures  -stroke  -tobacco smoker  -an unusual or allergic reaction to etonogestrel, anesthetics or antiseptics, other medicines, foods, dyes, or preservatives  -pregnant or trying to get pregnant  -breast-feeding  How should I use this medicine?  This device is inserted just under the skin on the inner side of your upper arm by a health care professional.  Talk to your pediatrician regarding the use of this medicine in children. Special care may be needed.  Overdosage: If you think you have taken too much of this medicine contact a poison control center or emergency room at once.  NOTE: This medicine is only for you. Do not share this medicine with others.  What if I miss a dose?  This does not apply.  What may interact with this medicine?  Do not take this medicine with any of the following medications:  -amprenavir  -fosamprenavir  This medicine may also interact with the following medications:  -acitretin  -aprepitant  -armodafinil  -bexarotene  -bosentan  -carbamazepine  -certain medicines for fungal infections like fluconazole, ketoconazole, itraconazole and voriconazole  -certain medicines to treat hepatitis, HIV or  AIDS  -cyclosporine  -felbamate  -griseofulvin  -lamotrigine  -modafinil  -oxcarbazepine  -phenobarbital  -phenytoin  -primidone  -rifabutin  -rifampin  -rifapentine  -St. John's wort  -topiramate  This list may not describe all possible interactions. Give your health care provider a list of all the medicines, herbs, non-prescription drugs, or dietary supplements you use. Also tell them if you smoke, drink alcohol, or use illegal drugs. Some items may interact with your medicine.  What should I watch for while using this medicine?  This product does not protect you against HIV infection (AIDS) or other sexually transmitted diseases.  You should be able to feel the implant by pressing your fingertips over the skin where it was inserted. Contact your doctor if you cannot feel the implant, and use a non-hormonal birth control method (such as condoms) until your doctor confirms that the implant is in place. Contact your doctor if you think that the implant may have broken or become bent while in your arm.  You will receive a user card from your health care provider after the implant is inserted. The card is a record of the location of the implant in your upper arm and when it should be removed. Keep this card with your health records.  What side effects may I notice from receiving this medicine?  Side effects that you should report to your doctor or health care professional as soon as possible:  -allergic reactions like skin rash, itching or hives, swelling of the face, lips, or tongue  -breast lumps, breast tissue   changes, or discharge  -breathing problems  -changes in emotions or moods  -if you feel that the implant may have broken or bent while in your arm  -high blood pressure  -pain, irritation, swelling, or bruising at the insertion site  -scar at site of insertion  -signs of infection at the insertion site such as fever, and skin redness, pain or discharge  -signs and symptoms of a blood clot such as breathing  problems; changes in vision; chest pain; severe, sudden headache; pain, swelling, warmth in the leg; trouble speaking; sudden numbness or weakness of the face, arm or leg  -signs and symptoms of liver injury like dark yellow or brown urine; general ill feeling or flu-like symptoms; light-colored stools; loss of appetite; nausea; right upper belly pain; unusually weak or tired; yellowing of the eyes or skin  -unusual vaginal bleeding, discharge  Side effects that usually do not require medical attention (report to your doctor or health care professional if they continue or are bothersome):  -acne  -breast pain or tenderness  -headache  -irregular menstrual bleeding  -nausea  This list may not describe all possible side effects. Call your doctor for medical advice about side effects. You may report side effects to FDA at 1-800-FDA-1088.  Where should I keep my medicine?  This drug is given in a hospital or clinic and will not be stored at home.  NOTE: This sheet is a summary. It may not cover all possible information. If you have questions about this medicine, talk to your doctor, pharmacist, or health care provider.   2019 Elsevier/Gold Standard (2017-09-25 14:11:42)

## 2019-05-01 NOTE — Progress Notes (Signed)
Subjective:     Teresa Harrington is a 23 y.o. female who presents for a postpartum visit. She is 4 weeks postpartum following a spontaneous vaginal delivery. I have fully reviewed the prenatal and intrapartum course. The delivery was at [redacted] weeks gestational weeks. Outcome: spontaneous vaginal delivery. Anesthesia: epidural. Postpartum course has been normzl. Baby's course has been g. Baby is feeding by both breast and bottle - Jerlyn Ly Start with Probiotics. Bleeding no bleeding. Bowel function is normal. Bladder function is normal. Patient is not sexually active. Contraception method is Nexplanon. Postpartum depression screening: negative.  The following portions of the patient's history were reviewed and updated as appropriate: allergies, current medications, past family history, past medical history, past social history, past surgical history and problem list.  Review of Systems Pertinent items are noted in HPI.   Objective:    BP 111/69   Pulse 89   Temp 98.2 F (36.8 C)   Wt 165 lb 4.8 oz (75 kg)   LMP 06/20/2018 (Exact Date)   BMI 27.51 kg/m   General:  alert, cooperative and no distress                 Vagina: not evaluated  Cervix:     Corpus: not examined  Adnexa:  not evaluated   Assessment:     normal postpartum exam. Pap smear not done at today's visit.   Plan:    1. Contraception: Nexplanon 2. See insertion note 3. Follow up as needed.    Woodroe Mode, MD 05/01/2019

## 2019-05-16 ENCOUNTER — Telehealth: Payer: Self-pay | Admitting: Obstetrics & Gynecology

## 2019-05-16 NOTE — Telephone Encounter (Signed)
Called patient back to give her an appointment. Mailbox is full, and I was not able to leave a message.

## 2019-05-16 NOTE — Telephone Encounter (Signed)
Attempted to cal patient to get her an appointment. Phone rang busy.

## 2019-05-19 ENCOUNTER — Ambulatory Visit: Payer: BC Managed Care – PPO

## 2019-05-19 ENCOUNTER — Telehealth: Payer: Self-pay | Admitting: Advanced Practice Midwife

## 2019-05-19 NOTE — Telephone Encounter (Signed)
The patient called in stating she started her cycle. Would like to reschedule. She also stated she is bleeding due to birth control and is unsure when it will stop. Scheduled for the later part of July.

## 2019-06-10 ENCOUNTER — Telehealth: Payer: Self-pay | Admitting: Family Medicine

## 2019-06-10 NOTE — Telephone Encounter (Signed)
Spoke with patient about her appointment on 7/22 @ 10:20. Patient stated that she is still on her cycle and would like to cancel the appointment. Patient stated that she will give the office a call back to be rescheduled once her cycle is off. The appointment was canceled

## 2019-06-11 ENCOUNTER — Ambulatory Visit: Payer: BC Managed Care – PPO

## 2019-08-06 ENCOUNTER — Other Ambulatory Visit: Payer: Self-pay

## 2019-08-06 ENCOUNTER — Ambulatory Visit (INDEPENDENT_AMBULATORY_CARE_PROVIDER_SITE_OTHER): Payer: BC Managed Care – PPO | Admitting: General Practice

## 2019-08-06 ENCOUNTER — Other Ambulatory Visit: Payer: Self-pay | Admitting: Obstetrics and Gynecology

## 2019-08-06 DIAGNOSIS — Z113 Encounter for screening for infections with a predominantly sexual mode of transmission: Secondary | ICD-10-CM

## 2019-08-06 NOTE — Progress Notes (Signed)
I have reviewed this chart and agree with the RN/CMA assessment and management.    K. Meryl Glorianna Gott, M.D. Attending [facility])   

## 2019-08-06 NOTE — Progress Notes (Signed)
Patient presents to office today for STD screening, does not want blood testing. Patient instructed in self swab & specimen collected. Discussed results will be available in mychart and usually come back within 24-48 hours.   Koren Bound RN BSN 08/06/19

## 2019-08-07 LAB — CERVICOVAGINAL ANCILLARY ONLY
Chlamydia: NEGATIVE
Neisseria Gonorrhea: NEGATIVE
Trichomonas: NEGATIVE

## 2019-08-08 ENCOUNTER — Telehealth: Payer: Self-pay | Admitting: Obstetrics & Gynecology

## 2019-08-08 NOTE — Telephone Encounter (Signed)
Patient requesting a call back with her results.

## 2019-08-21 ENCOUNTER — Ambulatory Visit: Payer: BC Managed Care – PPO | Admitting: Obstetrics & Gynecology

## 2019-09-21 IMAGING — US US MFM OB FOLLOW UP
1 series · 14 of 28 positions shown · non-contrast
Comparison: none

[Series 1: us mfm ob follow up · 37 acquisitions, 14 frames shown]
[im 2/37]
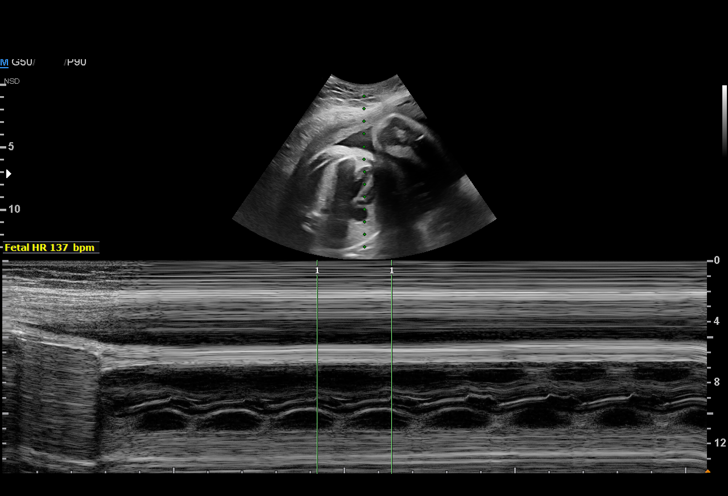
[im 5/37]
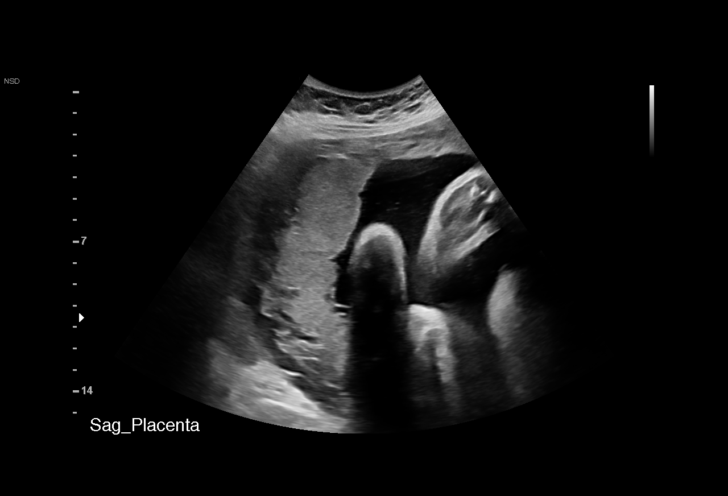
[im 7/37]
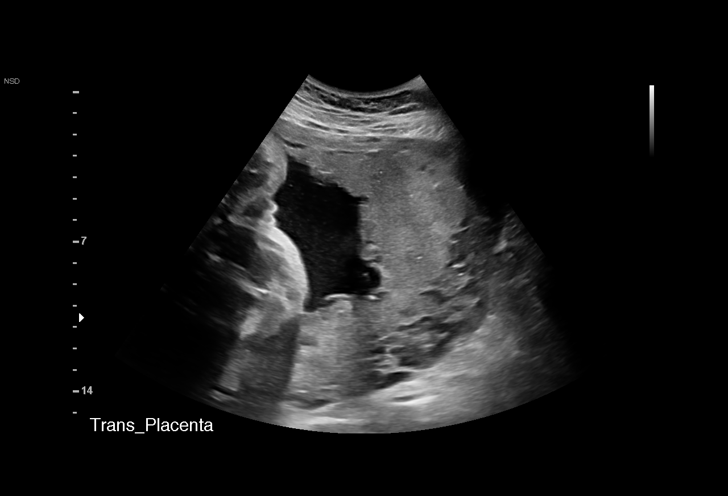
[im 10/37]
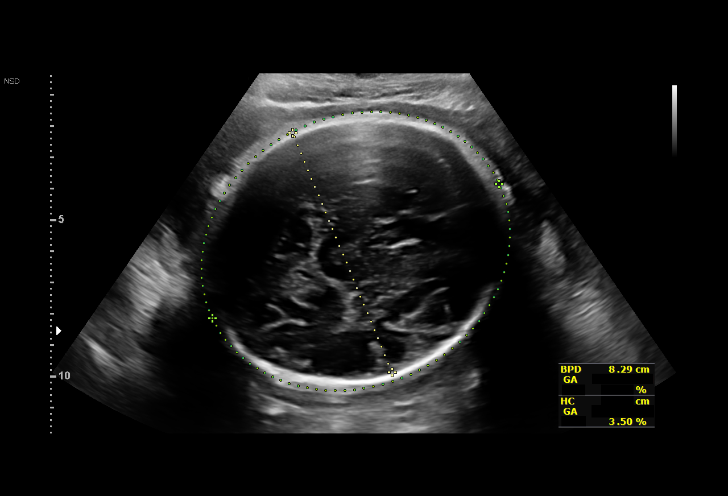
[im 13/37]
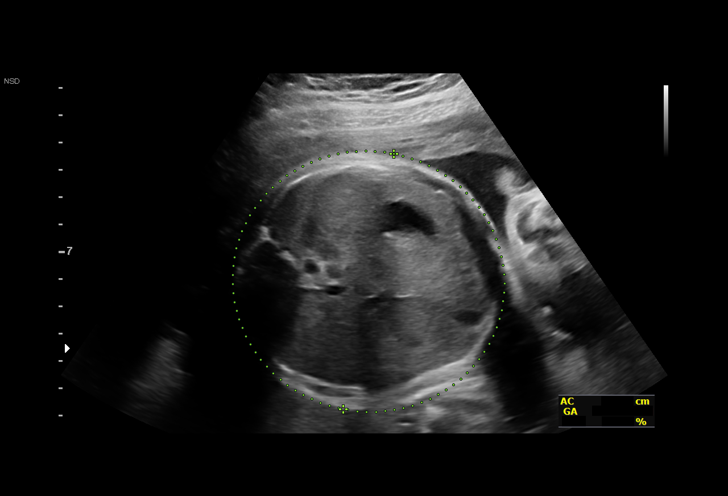
[im 15/37]
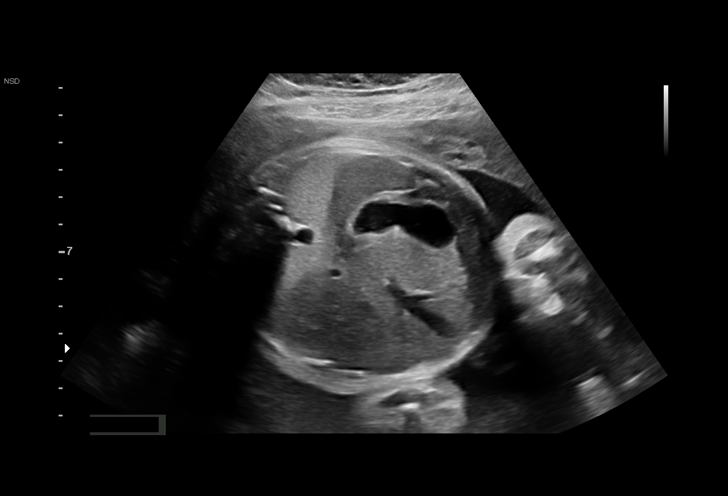
[im 18/37]
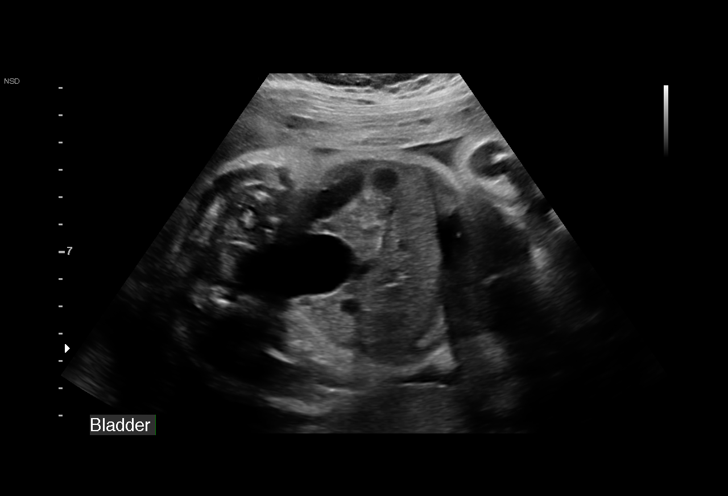
[im 21/37]
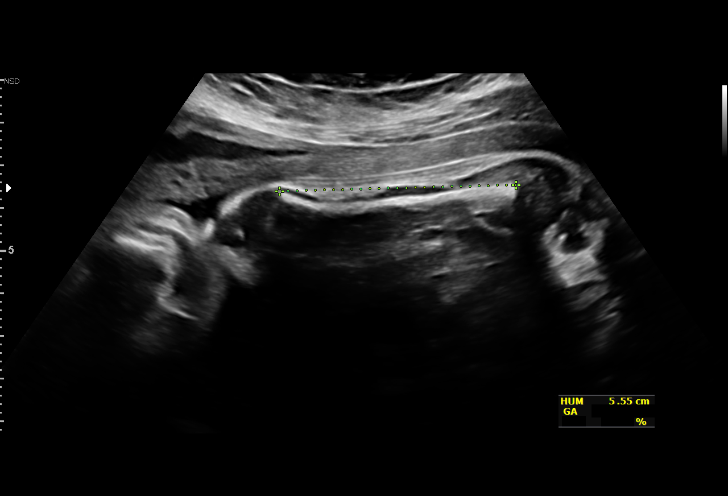
[im 23/37]
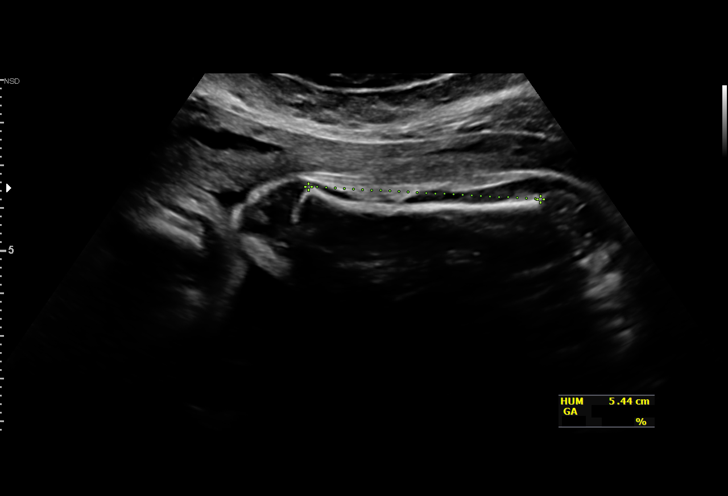
[im 26/37]
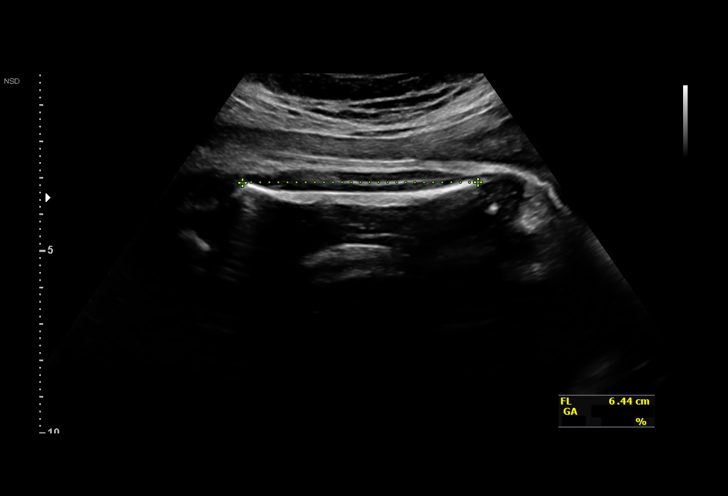
[im 29/37]
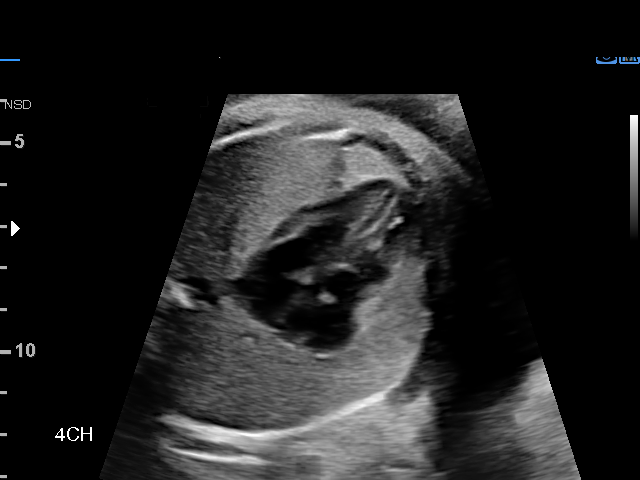
[im 31/37]
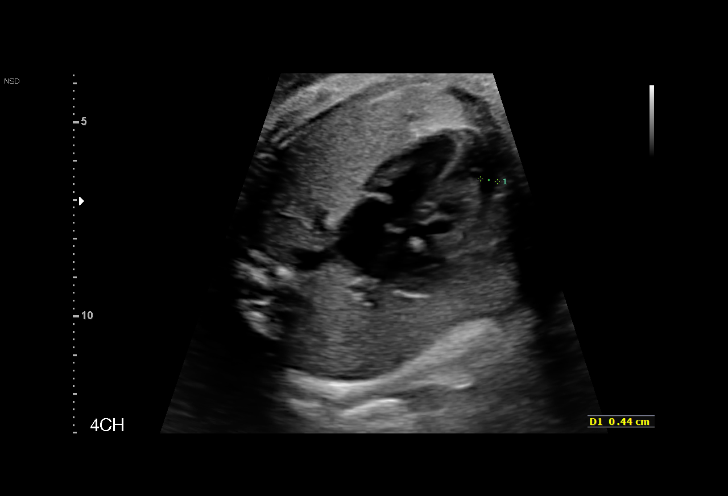
[im 34/37]
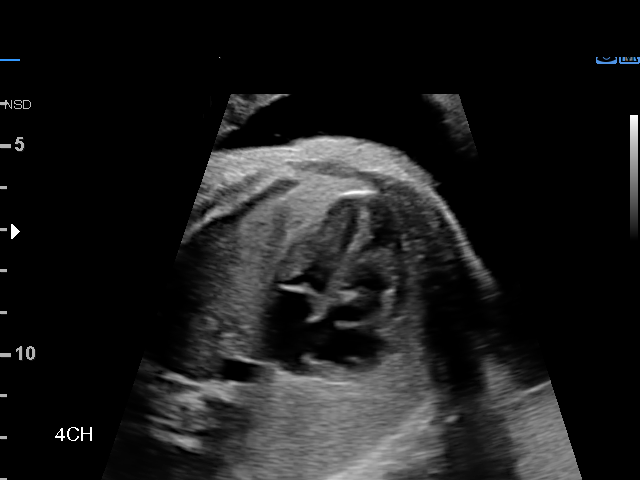
[im 37/37]
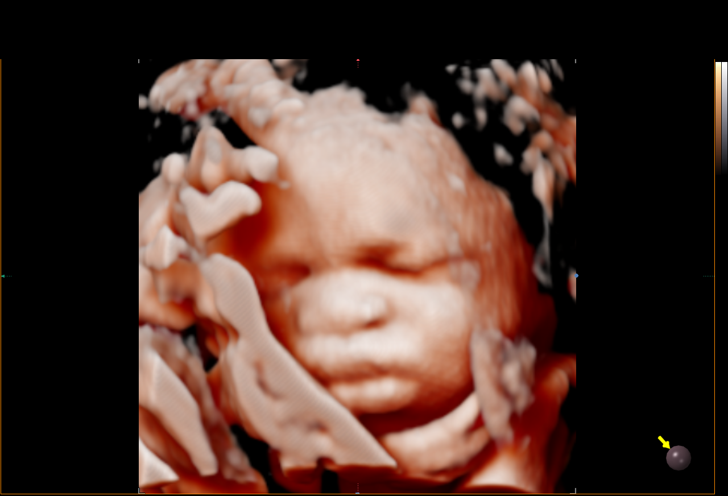

[14 of 28 positions shown; findings below may reference images not displayed]

----------------------------------------------------------------------

 ----------------------------------------------------------------------
Indications

  Antenatal follow-up for nonvisualized fetal
  anatomy
  33 weeks gestation of pregnancy
 ----------------------------------------------------------------------
Vital Signs

                                                 Height:        5'5"
Fetal Evaluation

 Num Of Fetuses:          1
 Fetal Heart Rate(bpm):   137
 Cardiac Activity:        Observed
 Presentation:            Breech
 Placenta:                Fundal
 P. Cord Insertion:       Previously Visualized

 Amniotic Fluid
 AFI FV:      Within normal limits

 AFI Sum(cm)     %Tile       Largest Pocket(cm)
 18.34           68

 RUQ(cm)       RLQ(cm)        LUQ(cm)        LLQ(cm)

Biometry

 BPD:        83   mm     G. Age:  33w 3d         36  %    CI:         77.99  %    70 - 86
                                                          FL/HC:       21.6  %    19.4 -
 HC:      297.4   mm     G. Age:  33w 0d          6  %    HC/AC:       0.98       0.96 -
 AC:      303.1   mm     G. Age:  34w 2d         69  %    FL/BPD:      77.3  %    71 - 87
 FL:       64.2   mm     G. Age:  33w 1d         25  %    FL/AC:       21.2  %    20 - 24
 HUM:      54.9   mm     G. Age:  32w 0d         22  %

 LV:          5   mm

 Est. FW:    3333   gm          5 lb      59  %
OB History

 Gravidity:     1         Term:  0          Prem:  0        SAB:   0
 TOP:           0       Ectopic: 0         Living: 0
Gestational Age

 LMP:            34w 5d       Date:  06/20/18                   EDD:  03/27/19
 Clinical EDD:   33w 5d                                         EDD:  04/03/19
 U/S Today:      33w 3d                                         EDD:  04/05/19
 Best:           33w 5d    Det. By:  Clinical EDD               EDD:  04/03/19
Anatomy

 Cranium:                Appears normal         Aortic Arch:            Previously seen
 Cavum:                  Previously seen        Ductal Arch:            Not well visualized
 Ventricles:             Appears normal         Diaphragm:              Appears normal
 Choroid Plexus:         Previously seen        Stomach:                Appears normal, left
                                                                        sided
 Cerebellum:             Previously seen        Abdomen:                Appears normal
 Posterior Fossa:        Previously seen        Abdominal Wall:         Previously seen
 Nuchal Fold:            Not applicable (>20    Cord Vessels:           Previously seen
                         wks GA)
 Face:                   Profile appears        Kidneys:                Appear normal
                         normal
 Lips:                   Previously seen        Bladder:                Appears normal
 Heart:                  Appears normal         Spine:                  Previously seen
                         (4CH, axis, and situs)
 RVOT:                   Previously seen        Upper Extremities:      Previously seen
 LVOT:                   Previously seen        Lower Extremities:      Previously seen

 Other:   Female gender previously seen. Heels previously visualized. Profile
          appears normal. Technically difficult due to fetal position.
Cervix Uterus Adnexa

 Cervix
 Not visualized (advanced GA >17wks)
Impression

 Pericardial effusion not well visulized today
 Normal interval growth
Recommendations

 Follow up pericardial effusion in 2 weeks, if normal follow up
 growth as clinically indicated.

## 2019-12-04 ENCOUNTER — Ambulatory Visit (HOSPITAL_COMMUNITY)
Admission: EM | Admit: 2019-12-04 | Discharge: 2019-12-04 | Discharge status: Against Medical Advice | Payer: BC Managed Care – PPO

## 2019-12-04 ENCOUNTER — Other Ambulatory Visit: Payer: Self-pay

## 2019-12-04 NOTE — ED Notes (Signed)
Per front staff, pt never answered when called to be registered.  No clinical staff spoke with the pt.

## 2020-02-17 ENCOUNTER — Ambulatory Visit: Payer: BC Managed Care – PPO

## 2020-03-02 ENCOUNTER — Ambulatory Visit: Payer: BC Managed Care – PPO

## 2020-09-10 ENCOUNTER — Other Ambulatory Visit (HOSPITAL_COMMUNITY)
Admission: RE | Admit: 2020-09-10 | Discharge: 2020-09-10 | Disposition: A | Discharge status: Home / Self Care | Payer: Self-pay | Source: Ambulatory Visit | Attending: Family Medicine | Admitting: Family Medicine

## 2020-09-10 ENCOUNTER — Other Ambulatory Visit: Payer: Self-pay

## 2020-09-10 ENCOUNTER — Ambulatory Visit (INDEPENDENT_AMBULATORY_CARE_PROVIDER_SITE_OTHER): Payer: Self-pay | Admitting: *Deleted

## 2020-09-10 DIAGNOSIS — Z113 Encounter for screening for infections with a predominantly sexual mode of transmission: Secondary | ICD-10-CM | POA: Insufficient documentation

## 2020-09-10 NOTE — Progress Notes (Signed)
Pt requests self swab for STI check. She states that she has a new sex partner x1 month and does not use condoms. She has noticed vaginal odor x1 week however denies discharge or abdominal pain. Pt has Nexplanon birth control. Self swab was obtained and pt was advised that she will be notified of results as well as treatment indicated if any via Mychart. Per chart review, pt was last seen in office on 05/01/19 for post partum visit. She was advised of need for annual Gyn exam w/pap. Pt voiced understanding and agreed to plan of care.

## 2020-09-13 LAB — CERVICOVAGINAL ANCILLARY ONLY
Bacterial Vaginitis (gardnerella): POSITIVE — AB
Candida Glabrata: NEGATIVE
Candida Vaginitis: NEGATIVE
Chlamydia: POSITIVE — AB
Comment: NEGATIVE
Comment: NEGATIVE
Comment: NEGATIVE
Comment: NEGATIVE
Comment: NEGATIVE
Comment: NORMAL
Neisseria Gonorrhea: NEGATIVE
Trichomonas: NEGATIVE

## 2020-09-13 MED ORDER — METRONIDAZOLE 500 MG PO TABS: 500.0000 mg | ORAL_TABLET | Freq: Two times a day (BID) | ORAL | 0 refills | Status: DC

## 2020-09-13 MED ORDER — AZITHROMYCIN 500 MG PO TABS: 1000.0000 mg | ORAL_TABLET | Freq: Once | ORAL | 1 refills | Status: AC

## 2020-09-13 NOTE — Progress Notes (Signed)
Patient was assessed and managed by nursing staff during this encounter. I have reviewed the chart and agree with the documentation and plan. I have also made any necessary editorial changes.  Catalina Antigua, MD 09/13/2020 3:03 PM

## 2020-09-13 NOTE — Addendum Note (Signed)
Addended by: Catalina Antigua on: 09/13/2020 03:03 PM   Modules accepted: Orders

## 2020-09-14 ENCOUNTER — Telehealth: Payer: Self-pay | Admitting: Lactation Services

## 2020-09-14 NOTE — Telephone Encounter (Signed)
-----   Message from Catalina Antigua, MD sent at 09/13/2020  3:05 PM EDT ----- PLease inform patient of BV and chlamydia infection. Rx e-prescribed for both. Partner needs to be informed of chlamydia infection. They should both abstain for 7 days following treatment

## 2020-09-14 NOTE — Telephone Encounter (Signed)
Addressed in My Chart message with patient. STD report faxed to [facility] Department.

## 2020-09-29 ENCOUNTER — Telehealth: Payer: Self-pay

## 2020-09-29 NOTE — Telephone Encounter (Signed)
Patient called stating she is newly pregnant. Patient is scheduled for new ob on Nov 16th with our office. Patient states that she had some cramping last night that woke her up. Patient denies any bleeding at this time. Patient made aware cramping in early pregnancy can be normal and it is good that it only occurred for a short interval and she has no bleeding. Continue to monitor this and stay hydrated with water as much as possible. Patient states understanding and agrees with plan Kathrene Alu, RN

## 2020-10-05 ENCOUNTER — Other Ambulatory Visit (HOSPITAL_COMMUNITY)
Admission: RE | Admit: 2020-10-05 | Discharge: 2020-10-05 | Disposition: A | Discharge status: Home / Self Care | Payer: BLUE CROSS/BLUE SHIELD | Source: Ambulatory Visit | Attending: Advanced Practice Midwife | Admitting: Advanced Practice Midwife

## 2020-10-05 ENCOUNTER — Ambulatory Visit (INDEPENDENT_AMBULATORY_CARE_PROVIDER_SITE_OTHER): Payer: Medicaid Other | Admitting: Advanced Practice Midwife

## 2020-10-05 ENCOUNTER — Encounter: Payer: Self-pay | Admitting: Advanced Practice Midwife

## 2020-10-05 ENCOUNTER — Other Ambulatory Visit: Payer: Self-pay

## 2020-10-05 DIAGNOSIS — Z3400 Encounter for supervision of normal first pregnancy, unspecified trimester: Secondary | ICD-10-CM | POA: Insufficient documentation

## 2020-10-05 DIAGNOSIS — Z3401 Encounter for supervision of normal first pregnancy, first trimester: Secondary | ICD-10-CM | POA: Diagnosis not present

## 2020-10-05 NOTE — Progress Notes (Signed)
  Subjective:    Conor Filsaime is a G1P0 [redacted]w[redacted]d being seen today for her first obstetrical visit.  Her obstetrical history is significant for primigravida. Patient does intend to breast feed. Pregnancy history fully reviewed.  States pregnancy was planned "by me but not by him".  States they are both excited now.   Patient reports cramping.  Vitals:   10/05/20 1013  BP: 121/63  Pulse: 66  Weight: 179 lb (81.2 kg)    HISTORY: OB History  Gravida Para Term Preterm AB Living  1            SAB TAB Ectopic Multiple Live Births               # Outcome Date GA Lbr Len/2nd Weight Sex Delivery Anes PTL Lv  1 Current            History reviewed. No pertinent past medical history. History reviewed. No pertinent surgical history. History reviewed. No pertinent family history.   Exam    Uterus:     Pelvic Exam:    Perineum: No Hemorrhoids, Normal Perineum   Vulva: Bartholin's, Urethra, Skene's normal   Vagina:  normal discharge   pH:    Cervix: no bleeding following Pap and no cervical motion tenderness   Adnexa: no mass, fullness, tenderness   Bony Pelvis: gynecoid  System: Breast:  normal appearance, no masses or tenderness   Skin: normal coloration and turgor, no rashes    Neurologic: oriented, grossly non-focal   Extremities: normal strength, tone, and muscle mass   HEENT neck supple with midline trachea   Mouth/Teeth mucous membranes moist, pharynx normal without lesions   Neck supple   Cardiovascular: regular rate and rhythm   Respiratory:  appears well, vitals normal, no respiratory distress, acyanotic, normal RR, ear and throat exam is normal, neck free of mass or lymphadenopathy, chest clear, no wheezing, crepitations, rhonchi, normal symmetric air entry   Abdomen: soft, non-tender; bowel sounds normal; no masses,  no organomegaly   Urinary: urethral meatus normal      Assessment:    Pregnancy: G1P0 Patient Active Problem List   Diagnosis Date Noted  .  Supervision of normal first pregnancy, antepartum 10/05/2020        Plan:  Initial labs drawn. Continue prenatal vitamins. Discussed and offered genetic screening options, including Quad screen/AFP, NIPS testing, and option to decline testing. Benefits/risks/alternatives reviewed. Pt aware that anatomy US is form of genetic screening with lower accuracy in detecting trisomies than blood work.  Pt chooses genetic screening today. NIPS: ordered. Ultrasound discussed; fetal anatomic survey: ordered. Problem list reviewed and updated. The nature of [facility] with multiple MDs and other Advanced Practice Providers was explained to patient; also emphasized that residents, students are part of our team. Routine obstetric precautions reviewed.    Follow up in 5 weeks. 50% of 30 min visit spent on counseling and coordination of care.     Hansel Feinstein 10/05/2020

## 2020-10-05 NOTE — Progress Notes (Signed)
DATING AND VIABILITY SONOGRAM   Jessica Hansen is a 24 y.o. year old G1P0 with LMP Patient's last menstrual period was 08/16/2020. which would correlate to  [redacted]w[redacted]d weeks gestation.  She has regular menstrual cycles.   She is here today for a confirmatory initial sonogram.    GESTATION: SINGLETON    FETAL ACTIVITY:          Heart rate         138            ADNEXA: The ovaries are normal.   GESTATIONAL AGE AND  BIOMETRICS:  Gestational criteria: Estimated Date of Delivery: 05/23/21 by LMP now at [redacted]w[redacted]d  Previous Scans:0  GESTATIONAL SAC           2.20 cm       7-1 weeks  CROWN RUMP LENGTH           0.77 cm       6-5 weeks                                                                               AVERAGE EGA(BY THIS SCAN):  7-0 weeks  WORKING EDD( LMP ):  05/23/2020     TECHNICIAN COMMENTS: Patient informed that the ultrasound is considered a limited obstetric ultrasound and is not intended to be a complete ultrasound exam. Patient also informed that the ultrasound is not being completed with the intent of assessing for fetal or placental anomalies or any pelvic abnormalities. Explained that the purpose of today's ultrasound is to assess for fetal heart rate. Patient acknowledges the purpose of the exam and the limitations of the study.     Kathrene Alu 10/05/2020 10:52 AM

## 2020-10-07 LAB — CYTOLOGY - PAP
Chlamydia: NEGATIVE
Comment: NEGATIVE
Comment: NORMAL
Diagnosis: NEGATIVE
Neisseria Gonorrhea: NEGATIVE

## 2020-10-07 LAB — CULTURE, OB URINE

## 2020-10-07 LAB — URINE CULTURE, OB REFLEX

## 2020-10-13 LAB — CBC/D/PLT+RPR+RH+ABO+RUB AB...
Antibody Screen: NEGATIVE
Basophils Absolute: 0 10*3/uL (ref 0.0–0.2)
Basos: 0 %
EOS (ABSOLUTE): 0.2 10*3/uL (ref 0.0–0.4)
Eos: 2 %
HCV Ab: <0.1 s/co ratio (ref 0.0–0.9)
HIV Screen 4th Generation wRfx: NONREACTIVE
Hematocrit: 43.1 % (ref 34.0–46.6)
Hemoglobin: 15.1 g/dL (ref 11.1–15.9)
Hepatitis B Surface Ag: NEGATIVE
Immature Grans (Abs): 0.1 10*3/uL (ref 0.0–0.1)
Immature Granulocytes: 1 %
Lymphocytes Absolute: 3.1 10*3/uL (ref 0.7–3.1)
Lymphs: 30 %
MCH: 31.6 pg (ref 26.6–33.0)
MCHC: 35 g/dL (ref 31.5–35.7)
MCV: 90 fL (ref 79–97)
Monocytes Absolute: 0.8 10*3/uL (ref 0.1–0.9)
Monocytes: 8 %
Neutrophils Absolute: 6 10*3/uL (ref 1.4–7.0)
Neutrophils: 59 %
Platelets: 301 10*3/uL (ref 150–450)
RBC: 4.78 x10E6/uL (ref 3.77–5.28)
RDW: 13.3 % (ref 11.7–15.4)
RPR Ser Ql: NONREACTIVE
Rh Factor: POSITIVE
Rubella Antibodies, IGG: 1.06 index (ref >0.99)
WBC: 10.2 10*3/uL (ref 3.4–10.8)

## 2020-10-13 LAB — SMN1 COPY NUMBER ANALYSIS (SMA CARRIER SCREENING)

## 2020-10-13 LAB — HEMOGLOBIN A1C
Est. average glucose Bld gHb Est-mCnc: 126 mg/dL
Hgb A1c MFr Bld: 6 % — ABNORMAL HIGH (ref 4.8–5.6)

## 2020-10-13 LAB — HEMOGLOBPATHY+FER W/A THAL RFX
Ferritin: 54 ng/mL (ref 15–150)
Hgb A2: 2.9 % (ref 1.8–3.2)
Hgb A: 97.1 % (ref 96.4–98.8)
Hgb F: 0 % (ref 0.0–2.0)
Hgb S: 0 %

## 2020-10-13 LAB — HCV INTERPRETATION

## 2020-10-25 ENCOUNTER — Ambulatory Visit: Payer: Self-pay | Admitting: Obstetrics and Gynecology

## 2020-11-09 ENCOUNTER — Encounter: Payer: Self-pay | Admitting: Advanced Practice Midwife

## 2020-11-09 ENCOUNTER — Other Ambulatory Visit: Payer: Self-pay

## 2020-11-09 ENCOUNTER — Ambulatory Visit (INDEPENDENT_AMBULATORY_CARE_PROVIDER_SITE_OTHER): Payer: BC Managed Care – PPO | Admitting: Advanced Practice Midwife

## 2020-11-09 VITALS — BP 132/77 | HR 85 | Wt 180.0 lb

## 2020-11-09 DIAGNOSIS — Z3A12 12 weeks gestation of pregnancy: Secondary | ICD-10-CM

## 2020-11-09 DIAGNOSIS — R7309 Other abnormal glucose: Secondary | ICD-10-CM | POA: Insufficient documentation

## 2020-11-09 NOTE — Progress Notes (Signed)
   PRENATAL VISIT NOTE  Subjective:  Jessica Hansen is a 24 y.o. G1P0 at [redacted]w[redacted]d being seen today for ongoing prenatal care.  She is currently monitored for the following issues for this low-risk pregnancy and has Supervision of normal first pregnancy, antepartum and Elevated hemoglobin A1c on their problem list.  Patient reports no complaints.  Contractions: Not present. Vag. Bleeding: None.  Movement: Absent. Denies leaking of fluid.   The following portions of the patient's history were reviewed and updated as appropriate: allergies, current medications, past family history, past medical history, past social history, past surgical history and problem list.   Objective:   Vitals:   11/09/20 1019  BP: 132/77  Pulse: 85  Weight: 180 lb (81.6 kg)    Fetal Status: Fetal Heart Rate (bpm): 158   Movement: Absent     General:  Alert, oriented and cooperative. Patient is in no acute distress.  Skin: Skin is warm and dry. No rash noted.   Cardiovascular: Normal heart rate noted  Respiratory: Normal respiratory effort, no problems with respiration noted  Abdomen: Soft, gravid, appropriate for gestational age.  Pain/Pressure: Absent     Pelvic: Cervical exam deferred        Extremities: Normal range of motion.  Edema: None  Mental Status: Normal mood and affect. Normal behavior. Normal judgment and thought content.   Assessment and Plan:  Pregnancy: G1P0 at [redacted]w[redacted]d 1. Elevated hemoglobin A1c     Will consult, discussed diabetic diet  2. [redacted] weeks gestation of pregnancy       - Panorama Prenatal Screen(Send Out)Scanned Report  Preterm labor symptoms and general obstetric precautions including but not limited to vaginal bleeding, contractions, leaking of fluid and fetal movement were reviewed in detail with the patient. Please refer to After Visit Summary for other counseling recommendations.   Future Appointments  Date Time Provider [place]  12/07/2020 10:30 AM Seabron Spates, CNM [facility] None    Hansel Feinstein, CNM

## 2020-11-09 NOTE — Patient Instructions (Signed)

## 2020-11-16 ENCOUNTER — Ambulatory Visit: Payer: BC Managed Care – PPO

## 2020-11-16 NOTE — Progress Notes (Signed)
Patient sent to lab for early 2 hour gtt due to her elevated hgbA1c. Armandina Stammer RN

## 2020-11-17 ENCOUNTER — Telehealth: Payer: Self-pay

## 2020-11-17 ENCOUNTER — Ambulatory Visit: Payer: BC Managed Care – PPO

## 2020-11-17 LAB — GLUCOSE TOLERANCE, 2 HOURS W/ 1HR
Glucose, 1 hour: 126 mg/dL (ref 65–179)
Glucose, 2 hour: 110 mg/dL (ref 65–152)
Glucose, Fasting: 81 mg/dL (ref 65–91)

## 2020-11-17 NOTE — Telephone Encounter (Signed)
Pt called to discuss 2 hr glucose results. Pt made aware that glucose test is WNL. Understanding was voiced.  Challis Crill l Nida Manfredi, CMA

## 2020-11-24 ENCOUNTER — Telehealth: Payer: Self-pay

## 2020-11-24 NOTE — Telephone Encounter (Signed)
Patient called and made aware of insufficient blood for her panorama - thus no results. Made aware we can try and redraw at her next visit. Armandina Stammer RN

## 2020-12-07 ENCOUNTER — Ambulatory Visit (INDEPENDENT_AMBULATORY_CARE_PROVIDER_SITE_OTHER): Payer: Medicaid Other | Admitting: Advanced Practice Midwife

## 2020-12-07 ENCOUNTER — Encounter: Payer: Self-pay | Admitting: Advanced Practice Midwife

## 2020-12-07 ENCOUNTER — Other Ambulatory Visit: Payer: Self-pay

## 2020-12-07 VITALS — BP 126/69 | HR 87 | Wt 182.0 lb

## 2020-12-07 DIAGNOSIS — J069 Acute upper respiratory infection, unspecified: Secondary | ICD-10-CM

## 2020-12-07 DIAGNOSIS — R7309 Other abnormal glucose: Secondary | ICD-10-CM

## 2020-12-07 DIAGNOSIS — Z3A16 16 weeks gestation of pregnancy: Secondary | ICD-10-CM

## 2020-12-07 NOTE — Patient Instructions (Addendum)
Second Trimester of Pregnancy  The second trimester of pregnancy is from week 13 through week 27. This is months 4 through 6 of pregnancy. The second trimester is often a time when you feel your best. Your body has adjusted to being pregnant, and you begin to feel better physically. During the second trimester:  Morning sickness has lessened or stopped completely.  You may have more energy.  You may have an increase in appetite. The second trimester is also a time when the unborn baby (fetus) is growing rapidly. At the end of the sixth month, the fetus may be up to 12 inches long and weigh about 1 pounds. You will likely begin to feel the baby move (quickening) between 16 and 20 weeks of pregnancy. Body changes during your second trimester Your body continues to go through many changes during your second trimester. The changes vary and generally return to normal after the baby is born. Physical changes  Your weight will continue to increase. You will notice your lower abdomen bulging out.  You may begin to get stretch marks on your hips, abdomen, and breasts.  Your breasts will continue to grow and to become tender.  Dark spots or blotches (chloasma or mask of pregnancy) may develop on your face.  A dark line from your belly button to the pubic area (linea nigra) may appear.  You may have changes in your hair. These can include thickening of your hair, rapid growth, and changes in texture. Some people also have hair loss during or after pregnancy, or hair that feels dry or thin. Health changes  You may develop headaches.  You may have heartburn.  You may develop constipation.  You may develop hemorrhoids or swollen, bulging veins (varicose veins).  Your gums may bleed and may be sensitive to brushing and flossing.  You may urinate more often because the fetus is pressing on your bladder.  You may have back pain. This is caused by: ? Weight gain. ? Pregnancy hormones that  are relaxing the joints in your pelvis. ? A shift in weight and the muscles that support your balance. Follow these instructions at home: Medicines  Follow your health care provider's instructions regarding medicine use. Specific medicines may be either safe or unsafe to take during pregnancy. Do not take any medicines unless approved by your health care provider.  Take a prenatal vitamin that contains at least 600 micrograms (mcg) of folic acid. Eating and drinking  Eat a healthy diet that includes fresh fruits and vegetables, whole grains, good sources of protein such as meat, eggs, or tofu, and low-fat dairy products.  Avoid raw meat and unpasteurized juice, milk, and cheese. These carry germs that can harm you and your baby.  You may need to take these actions to prevent or treat constipation: ? Drink enough fluid to keep your urine pale yellow. ? Eat foods that are high in fiber, such as beans, whole grains, and fresh fruits and vegetables. ? Limit foods that are high in fat and processed sugars, such as fried or sweet foods. Activity  Exercise only as directed by your health care provider. Most people can continue their usual exercise routine during pregnancy. Try to exercise for 30 minutes at least 5 days a week. Stop exercising if you develop contractions in your uterus.  Stop exercising if you develop pain or cramping in the lower abdomen or lower back.  Avoid exercising if it is very hot or humid or if you are at  a high altitude.  Avoid heavy lifting.  If you choose to, you may have sex unless your health care provider tells you not to. Relieving pain and discomfort  Wear a supportive bra to prevent discomfort from breast tenderness.  Take warm sitz baths to soothe any pain or discomfort caused by hemorrhoids. Use hemorrhoid cream if your health care provider approves.  Rest with your legs raised (elevated) if you have leg cramps or low back pain.  If you develop  varicose veins: ? Wear support hose as told by your health care provider. ? Elevate your feet for 15 minutes, 3-4 times a day. ? Limit salt in your diet. Safety  Wear your seat belt at all times when driving or riding in a car.  Talk with your health care provider if someone is verbally or physically abusive to you. Lifestyle  Do not use hot tubs, steam rooms, or saunas.  Do not douche. Do not use tampons or scented sanitary pads.  Avoid cat litter boxes and soil used by cats. These carry germs that can cause birth defects in the baby and possibly loss of the fetus by miscarriage or stillbirth.  Do not use herbal remedies, alcohol, illegal drugs, or medicines that are not approved by your health care provider. Chemicals in these products can harm your baby.  Do not use any products that contain nicotine or tobacco, such as cigarettes, e-cigarettes, and chewing tobacco. If you need help quitting, ask your health care provider. General instructions  During a routine prenatal visit, your health care provider will do a physical exam and other tests. He or she will also discuss your overall health. Keep all follow-up visits. This is important.  Ask your health care provider for a referral to a local prenatal education class.  Ask for help if you have counseling or nutritional needs during pregnancy. Your health care provider can offer advice or refer you to specialists for help with various needs. Where to find more information  American Pregnancy Association: americanpregnancy.[place] and Gynecologists: PoolDevices.com.pt  Office on [employer]: KeywordPortfolios.com.br Contact a health care provider if you have:  A headache that does not go away when you take medicine.  Vision changes or you see spots in front of your eyes.  Mild pelvic cramps, pelvic pressure, or nagging pain in the abdominal area.  Persistent nausea,  vomiting, or diarrhea.  A bad-smelling vaginal discharge or foul-smelling urine.  Pain when you urinate.  Sudden or extreme swelling of your face, hands, ankles, feet, or legs.  A fever. Get help right away if you:  Have fluid leaking from your vagina.  Have spotting or bleeding from your vagina.  Have severe abdominal cramping or pain.  Have difficulty breathing.  Have chest pain.  Have fainting spells.  Have not felt your baby move for the time period told by your health care provider.  Have new or increased pain, swelling, or redness in an arm or leg. Summary  The second trimester of pregnancy is from week 13 through week 27 (months 4 through 6).  Do not use herbal remedies, alcohol, illegal drugs, or medicines that are not approved by your health care provider. Chemicals in these products can harm your baby.  Exercise only as directed by your health care provider. Most people can continue their usual exercise routine during pregnancy.  Keep all follow-up visits. This is important. This information is not intended to replace advice given to you by your  health care provider. Make sure you discuss any questions you have with your health care provider. Document Revised: 04/14/2020 Document Reviewed: 02/19/2020 Elsevier Patient Education  2021 [place] During Pregnancy Why is genetic testing done? Genetic testing during pregnancy is also called prenatal genetic testing. This type of testing can determine if your baby is at risk of being born with a disorder caused by abnormal genes or chromosomes (genetic disorder). Chromosomes contain genes that control how your baby will develop in your womb. There are many different genetic disorders. Examples of genetic disorders that may be found through genetic testing include Down syndrome and cystic fibrosis. Gene changes (mutations) can be passed down through families. Genetic testing is offered to women during  pregnancy. You can choose whether to have genetic testing. Having genetic testing allows you to:  Discuss your test results and options with your health care provider.  Prepare for a baby that may be born with a genetic disorder. Learning about the disorder ahead of time helps you be better prepared to manage it. Your health care providers can also be prepared in case your baby requires special care before or after birth.  Consider whether you want to continue with the pregnancy. In some cases, genetic testing may be done to learn about the traits a child will inherit. Types of genetic tests There are two basic types of genetic testing. Screening tests indicate whether your developing baby (fetus) is at higher risk for a genetic disorder. Diagnostic tests check actual fetal cells to diagnose a genetic disorder. Screening tests Screening tests will not harm your baby. They are recommended for all pregnant women. Types of screening tests include:  Carrier screening. This test involves checking genes from both parents by testing their blood or saliva. The test checks to find out if the parents carry a genetic mutation that may be passed to a baby. In most cases, both parents must carry the mutation for a baby to be at risk.  First trimester screening. This test combines a blood test with sound wave imaging of your baby (fetal ultrasound). This screening test checks for a risk of Down syndrome or other defects caused by having extra chromosomes. The ultrasound also checks for defects of the heart, abdomen, or skeleton.  Second trimester screening also combines a blood test with a fetal ultrasound exam. This test checks for a risk of Down syndrome or other defects caused by having extra chromosomes. The ultrasound allows your health care provider to look for genetic defects of the face, brain, spine, heart, or limbs. Some women may choose to only have an ultrasound exam without a blood test.  Combined  or sequential screening. This type of testing combines the results of first and second trimester screening. This type of testing may be more accurate than first or second trimester screening alone.  Cell-free DNA testing. This is a blood test that detects cells released by the placenta that get into the mother's blood. It can be used to check for a risk of Down syndrome, other extra chromosome syndromes, and disorders caused by abnormal numbers of sex chromosomes. This test can be done any time after 10 weeks of pregnancy.      Diagnostic tests Diagnostic tests carry slight risks of problems, including bleeding, infection, and loss of the pregnancy. These tests are done only if your baby is at risk for a genetic disorder. Your health care provider will discuss the risks and benefits of having diagnostic tests  before performing these types of tests. Examples of diagnostic tests include:  Chorionic villus sampling (CVS). This involves a procedure to remove and test a sample of cells taken from the placenta. The procedure may be done between 10 and 12 weeks of pregnancy.  Amniocentesis. This involves a procedure to remove and test a sample of fluid (amniotic fluid) and cells from the sac that surrounds the developing baby. The procedure may be done any time during the pregnancy, but it is usually done between 15 and 20 weeks of pregnancy. What do the results mean? For a screening test:  If the results are negative, it often means that your child is not at higher risk. There is still a slight chance your child could have a genetic disorder.  If the results are positive, it does not mean your child will have a genetic disorder. It may mean that your child has a higher-than-normal risk for a genetic disorder. In that case, you should talk with your health care provider about whether you should have diagnostic genetic tests. For a diagnostic test:  If the result is negative, it is unlikely that your  child will have a genetic disorder.  If the test is positive for a genetic disorder, it is likely that your child will have the disorder. The test may not tell how severe the disorder will be. Talk with your health care provider about your options. Talk with your health care provider about what your results mean. Questions to ask your health care provider Before talking to your health care provider about genetic testing, find out if there is a history of genetic disorders in your family. It may also help to know your family's ethnic origins. Then ask your health care provider the following questions:  Is my baby at risk for a genetic disorder?  What are the benefits of having genetic screening?  What tests are best for me and my baby?  What are the risks of each test?  If I get a positive result on a screening test, what is the next step?  Should I meet with a genetic counselor?  Should my partner or other members of my family be tested?  How much do the tests cost? Will my insurance cover the testing? Summary  Genetic testing is done during pregnancy to find out whether your child is at risk for a genetic disorder.  Genetic testing is offered to women during pregnancy. You can choose whether to have genetic testing.  There are two basic types of genetic testing. Screening tests indicate whether your developing baby (fetus) is at higher risk for a genetic disorder. Diagnostic tests check actual fetal cells to diagnose a genetic disorder.  If a diagnostic genetic test is positive, talk with your health care provider about your options. This information is not intended to replace advice given to you by your health care provider. Make sure you discuss any questions you have with your health care provider. Document Revised: 05/28/2020 Document Reviewed: 05/28/2020 Elsevier Patient Education  [place].

## 2020-12-07 NOTE — Progress Notes (Signed)
° °  PRENATAL VISIT NOTE  Subjective:  Jessica Hansen is a 25 y.o. G1P0 at [redacted]w[redacted]d being seen today for ongoing prenatal care.  She is currently monitored for the following issues for this low-risk pregnancy and has Supervision of normal first pregnancy, antepartum and Elevated hemoglobin A1c on their problem list.  Patient reports has had URI for 1-2 weeks, tested neg twice for covid.  Contractions: Not present. Vag. Bleeding: None.  Movement: Present. Denies leaking of fluid.   The following portions of the patient's history were reviewed and updated as appropriate: allergies, current medications, past family history, past medical history, past social history, past surgical history and problem list.   Objective:   Vitals:   12/07/20 1019  BP: 126/69  Pulse: 87  Weight: 182 lb (82.6 kg)    Fetal Status: Fetal Heart Rate (bpm): 138   Movement: Present     General:  Alert, oriented and cooperative. Patient is in no acute distress.  Skin: Skin is warm and dry. No rash noted.   Cardiovascular: Normal heart rate noted  Respiratory: Normal respiratory effort, no problems with respiration noted  Abdomen: Soft, gravid, appropriate for gestational age.  Pain/Pressure: Absent     Pelvic: Cervical exam deferred        Extremities: Normal range of motion.  Edema: None  Mental Status: Normal mood and affect. Normal behavior. Normal judgment and thought content.   Assessment and Plan:  Pregnancy: G1P0 at 106w1d 1. [redacted] weeks gestation of pregnancy      Redraw Panorama today, unable to draw AFP, will do lab visit next week  - Genetic Screening - US MFM OB DETAIL +14 WK; Future  2. Elevated hemoglobin A1c     2 hour GTT normal  3. Viral upper respiratory tract infection     Tested twice for covid, negative     No fever, just cough  Preterm labor symptoms and general obstetric precautions including but not limited to vaginal bleeding, contractions, leaking of fluid and fetal movement were  reviewed in detail with the patient. Please refer to After Visit Summary for other counseling recommendations.   Return in about 4 weeks (around 01/04/2021) for [facility].    Hansel Feinstein, CNM

## 2020-12-07 NOTE — Addendum Note (Signed)
Addended by: Wendelyn Breslow L on: 12/07/2020 11:08 AM   Modules accepted: Orders

## 2020-12-10 ENCOUNTER — Ambulatory Visit: Payer: Medicaid Other

## 2020-12-13 ENCOUNTER — Ambulatory Visit: Payer: Medicaid Other

## 2020-12-27 ENCOUNTER — Other Ambulatory Visit: Payer: Self-pay

## 2020-12-27 ENCOUNTER — Ambulatory Visit: Payer: Medicaid Other | Admitting: *Deleted

## 2020-12-27 ENCOUNTER — Ambulatory Visit: Payer: Medicaid Other | Attending: Advanced Practice Midwife

## 2020-12-27 ENCOUNTER — Encounter: Payer: Self-pay | Admitting: *Deleted

## 2020-12-27 VITALS — BP 121/67 | HR 105

## 2020-12-27 DIAGNOSIS — E669 Obesity, unspecified: Secondary | ICD-10-CM | POA: Insufficient documentation

## 2020-12-27 DIAGNOSIS — O3412 Maternal care for benign tumor of corpus uteri, second trimester: Secondary | ICD-10-CM | POA: Diagnosis present

## 2020-12-27 DIAGNOSIS — O99212 Obesity complicating pregnancy, second trimester: Secondary | ICD-10-CM | POA: Insufficient documentation

## 2020-12-27 DIAGNOSIS — Z363 Encounter for antenatal screening for malformations: Secondary | ICD-10-CM | POA: Insufficient documentation

## 2020-12-27 DIAGNOSIS — D251 Intramural leiomyoma of uterus: Secondary | ICD-10-CM | POA: Insufficient documentation

## 2020-12-27 DIAGNOSIS — Z3A16 16 weeks gestation of pregnancy: Secondary | ICD-10-CM | POA: Diagnosis not present

## 2020-12-27 DIAGNOSIS — Z6831 Body mass index (BMI) 31.0-31.9, adult: Secondary | ICD-10-CM

## 2020-12-28 ENCOUNTER — Other Ambulatory Visit: Payer: Self-pay

## 2020-12-28 ENCOUNTER — Inpatient Hospital Stay (HOSPITAL_COMMUNITY)
Admission: AD | Admit: 2020-12-28 | Discharge: 2020-12-28 | Disposition: A | Discharge status: Home / Self Care | Payer: Medicaid Other | Attending: Family Medicine | Admitting: Family Medicine

## 2020-12-28 ENCOUNTER — Encounter (HOSPITAL_COMMUNITY): Payer: Self-pay | Admitting: Family Medicine

## 2020-12-28 DIAGNOSIS — O99512 Diseases of the respiratory system complicating pregnancy, second trimester: Secondary | ICD-10-CM

## 2020-12-28 DIAGNOSIS — Z87891 Personal history of nicotine dependence: Secondary | ICD-10-CM | POA: Diagnosis not present

## 2020-12-28 DIAGNOSIS — O99891 Other specified diseases and conditions complicating pregnancy: Secondary | ICD-10-CM | POA: Diagnosis not present

## 2020-12-28 DIAGNOSIS — G43009 Migraine without aura, not intractable, without status migrainosus: Secondary | ICD-10-CM | POA: Diagnosis not present

## 2020-12-28 DIAGNOSIS — E876 Hypokalemia: Secondary | ICD-10-CM | POA: Diagnosis not present

## 2020-12-28 DIAGNOSIS — O99352 Diseases of the nervous system complicating pregnancy, second trimester: Secondary | ICD-10-CM | POA: Diagnosis not present

## 2020-12-28 DIAGNOSIS — Z20822 Contact with and (suspected) exposure to covid-19: Secondary | ICD-10-CM | POA: Insufficient documentation

## 2020-12-28 DIAGNOSIS — R519 Headache, unspecified: Secondary | ICD-10-CM

## 2020-12-28 DIAGNOSIS — Z3A19 19 weeks gestation of pregnancy: Secondary | ICD-10-CM | POA: Diagnosis not present

## 2020-12-28 DIAGNOSIS — J351 Hypertrophy of tonsils: Secondary | ICD-10-CM | POA: Diagnosis not present

## 2020-12-28 DIAGNOSIS — R5383 Other fatigue: Secondary | ICD-10-CM | POA: Diagnosis present

## 2020-12-28 DIAGNOSIS — O99282 Endocrine, nutritional and metabolic diseases complicating pregnancy, second trimester: Secondary | ICD-10-CM | POA: Diagnosis not present

## 2020-12-28 LAB — URINALYSIS, ROUTINE W REFLEX MICROSCOPIC
Bilirubin Urine: NEGATIVE
Glucose, UA: NEGATIVE mg/dL
Hgb urine dipstick: NEGATIVE
Ketones, ur: NEGATIVE mg/dL
Leukocytes,Ua: NEGATIVE
Nitrite: NEGATIVE
Protein, ur: NEGATIVE mg/dL
Specific Gravity, Urine: 1.012 (ref 1.005–1.030)
pH: 7 (ref 5.0–8.0)

## 2020-12-28 LAB — CBC WITH DIFFERENTIAL/PLATELET
Abs Immature Granulocytes: 0.09 10*3/uL — ABNORMAL HIGH (ref 0.00–0.07)
Basophils Absolute: 0 10*3/uL (ref 0.0–0.1)
Basophils Relative: 0 %
Eosinophils Absolute: 0 10*3/uL (ref 0.0–0.5)
Eosinophils Relative: 0 %
HCT: 34.8 % — ABNORMAL LOW (ref 36.0–46.0)
Hemoglobin: 11.9 g/dL — ABNORMAL LOW (ref 12.0–15.0)
Immature Granulocytes: 1 %
Lymphocytes Relative: 24 %
Lymphs Abs: 1.5 10*3/uL (ref 0.7–4.0)
MCH: 30.4 pg (ref 26.0–34.0)
MCHC: 34.2 g/dL (ref 30.0–36.0)
MCV: 88.8 fL (ref 80.0–100.0)
Monocytes Absolute: 0.7 10*3/uL (ref 0.1–1.0)
Monocytes Relative: 11 %
Neutro Abs: 4.2 10*3/uL (ref 1.7–7.7)
Neutrophils Relative %: 64 %
Platelets: 192 10*3/uL (ref 150–400)
RBC: 3.92 MIL/uL (ref 3.87–5.11)
RDW: 12 % (ref 11.5–15.5)
WBC: 6.5 10*3/uL (ref 4.0–10.5)
nRBC: 0 % (ref 0.0–0.2)

## 2020-12-28 LAB — COMPREHENSIVE METABOLIC PANEL
ALT: 17 U/L (ref 0–44)
AST: 19 U/L (ref 15–41)
Albumin: 2.5 g/dL — ABNORMAL LOW (ref 3.5–5.0)
Alkaline Phosphatase: 78 U/L (ref 38–126)
Anion gap: 10 (ref 5–15)
BUN: <5 mg/dL — ABNORMAL LOW (ref 6–20)
CO2: 20 mmol/L — ABNORMAL LOW (ref 22–32)
Calcium: 8.4 mg/dL — ABNORMAL LOW (ref 8.9–10.3)
Chloride: 100 mmol/L (ref 98–111)
Creatinine, Ser: 0.55 mg/dL (ref 0.44–1.00)
GFR, Estimated: >60 mL/min (ref >60)
Glucose, Bld: 94 mg/dL (ref 70–99)
Potassium: 3.1 mmol/L — ABNORMAL LOW (ref 3.5–5.1)
Sodium: 130 mmol/L — ABNORMAL LOW (ref 135–145)
Total Bilirubin: 0.4 mg/dL (ref 0.3–1.2)
Total Protein: 6.5 g/dL (ref 6.5–8.1)

## 2020-12-28 MED ORDER — BUTALBITAL-APAP-CAFFEINE 50-325-40 MG PO TABS: 1.0000 | ORAL_TABLET | Freq: Four times a day (QID) | ORAL | 0 refills | Status: DC | PRN

## 2020-12-28 MED ORDER — ACETAMINOPHEN 325 MG PO TABS
650.0000 mg | ORAL_TABLET | Freq: Once | ORAL | Status: AC
  Administered 2020-12-28: 650 mg via ORAL
  Filled 2020-12-28: qty 2

## 2020-12-28 MED ORDER — PSEUDOEPHEDRINE HCL 30 MG PO TABS
60.0000 mg | ORAL_TABLET | Freq: Once | ORAL | Status: AC
  Administered 2020-12-28: 60 mg via ORAL
  Filled 2020-12-28: qty 2

## 2020-12-28 MED ORDER — POTASSIUM CHLORIDE CRYS ER 20 MEQ PO TBCR: 20.0000 meq | EXTENDED_RELEASE_TABLET | Freq: Two times a day (BID) | ORAL | 0 refills | Status: DC

## 2020-12-28 MED ORDER — PSEUDOEPHEDRINE HCL 30 MG PO TABS: 30.0000 mg | ORAL_TABLET | ORAL | 0 refills | Status: DC | PRN

## 2020-12-28 NOTE — Discharge Instructions (Signed)

## 2020-12-28 NOTE — MAU Note (Signed)
Pt reporting pressure in head and eye sockets, plus fatigue for 2 days. Also reports feeling tonsils are sore on left side when swallowing. Denies VB, LOF or an other pregnancy concerns.

## 2020-12-28 NOTE — MAU Provider Note (Signed)
History     CSN: [id number]  Arrival date and time: 12/28/20 1747   Event Date/Time   First Provider Initiated Contact with Patient 12/28/20 1841      Chief Complaint  Patient presents with  . pressure in head  . Fatigue   Jessica Hansen is a 25 y.o. G1P0 at [redacted]w[redacted]d who receives care at [facility].  She presents today for pressure in head and Fatigue.  She states her head pressure started "a few days ago, maybe Friday."  She states the pressure is constant and causes increased pain when "trying to look out of the corner of my eyes."  She states the pain is improved with sleeping and worsened by light. She states she has taken tylenol XR yesterday and Sunday with no relief of her symptoms.  Patient reports she has had her covid shot and denies being around sick individuals.  She does not work outside the home. Patient endorses fetal movement and denies abdominal pain or cramping.  She also denies vaginal concerns including discharge, bleeding, or leaking.    OB History    Gravida  1   Para      Term      Preterm      AB      Living        SAB      IAB      Ectopic      Multiple      Live Births              History reviewed. No pertinent past medical history.  History reviewed. No pertinent surgical history.  Family History  Problem Relation Age of Onset  . Depression Mother     Social History   Tobacco Use  . Smoking status: Former Research scientist (life sciences)  . Smokeless tobacco: Never Used  Vaping Use  . Vaping Use: Never used  Substance Use Topics  . Alcohol use: Yes    Comment: social  . Drug use: Never    Allergies:  Allergies  Allergen Reactions  . Celery Oil Hives    Medications Prior to Admission  Medication Sig Dispense Refill Last Dose  . acetaminophen (TYLENOL) 500 MG tablet Take 500 mg by mouth every 6 (six) hours as needed for headache.   12/27/2020 at Unknown time  . Prenatal Vit-Fe Fumarate-FA (PRENATAL VITAMINS PO) Take by mouth.   12/28/2020 at Unknown  time  . dicyclomine (BENTYL) 10 MG capsule Take 1 capsule (10 mg total) by mouth 3 (three) times daily as needed for spasms. (Patient not taking: No sig reported) 8 capsule 0   . ibuprofen (ADVIL,MOTRIN) 200 MG tablet Take 200-400 mg by mouth every 6 (six) hours as needed (for pain). (Patient not taking: No sig reported)     . metroNIDAZOLE (FLAGYL) 500 MG tablet Take 1 tablet (500 mg total) by mouth 2 (two) times daily. (Patient not taking: No sig reported) 14 tablet 0   . ondansetron (ZOFRAN ODT) 4 MG disintegrating tablet 4mg  ODT q4 hours prn nausea/vomit (Patient not taking: No sig reported) 4 tablet 0   . promethazine (PHENERGAN) 25 MG tablet Take 1 tablet (25 mg total) by mouth every 6 (six) hours as needed for nausea or vomiting. (Patient not taking: No sig reported) 6 tablet 0     Review of Systems  Constitutional: Negative for chills and fever.  HENT: Positive for sore throat. Negative for congestion, ear discharge, ear pain, rhinorrhea, sinus pressure, sinus pain and trouble swallowing.  Eyes: Negative for visual disturbance.  Respiratory: Negative for cough, chest tightness and shortness of breath.   Cardiovascular: Negative for chest pain.  Gastrointestinal: Negative for abdominal pain, nausea and vomiting.  Genitourinary: Negative for difficulty urinating, dysuria, vaginal bleeding and vaginal discharge.  Neurological: Negative for dizziness, light-headedness and headaches.   Physical Exam   Blood pressure 127/66, pulse (!) 113, temperature (!) 100.5 F (38.1 C), temperature source Oral, resp. rate 20, height 5\' 3"  (1.6 m), weight 82.6 kg, last menstrual period 08/16/2020, SpO2 100 %.  Physical Exam Vitals reviewed.  Constitutional:      Appearance: Normal appearance.  HENT:     Head: Normocephalic and atraumatic.     Right Ear: Hearing normal. No drainage, swelling or tenderness.     Left Ear: Hearing normal. No drainage, swelling or tenderness.     Mouth/Throat:      Mouth: Mucous membranes are moist. No oral lesions or angioedema.     Tongue: No lesions. Tongue does not deviate from midline.     Pharynx: Oropharynx is clear. Uvula midline. No posterior oropharyngeal erythema or uvula swelling.     Tonsils: No tonsillar exudate or tonsillar abscesses. 3+ on the right. 3+ on the left.     Comments: Tonsils nontender. Eyes:     Conjunctiva/sclera: Conjunctivae normal.  Cardiovascular:     Rate and Rhythm: Normal rate and regular rhythm.     Heart sounds: Normal heart sounds.  Pulmonary:     Effort: Pulmonary effort is normal. No respiratory distress.     Breath sounds: Normal breath sounds.  Abdominal:     General: Bowel sounds are normal.  Musculoskeletal:        General: Normal range of motion.     Cervical back: Normal range of motion.  Skin:    General: Skin is warm and dry.  Neurological:     Mental Status: She is alert and oriented to person, place, and time.  Psychiatric:        Mood and Affect: Mood normal.        Behavior: Behavior normal.        Thought Content: Thought content normal.    FHR: 133 by doppler MAU Course  Procedures Results for orders placed or performed during the hospital encounter of 12/28/20 (from the past 24 hour(s))  Urinalysis, Routine w reflex microscopic Nasopharyngeal Swab     Status: Abnormal   Collection Time: 12/28/20  6:25 PM  Result Value Ref Range   Color, Urine YELLOW YELLOW   APPearance CLOUDY (A) CLEAR   Specific Gravity, Urine 1.012 1.005 - 1.030   pH 7.0 5.0 - 8.0   Glucose, UA NEGATIVE NEGATIVE mg/dL   Hgb urine dipstick NEGATIVE NEGATIVE   Bilirubin Urine NEGATIVE NEGATIVE   Ketones, ur NEGATIVE NEGATIVE mg/dL   Protein, ur NEGATIVE NEGATIVE mg/dL   Nitrite NEGATIVE NEGATIVE   Leukocytes,Ua NEGATIVE NEGATIVE  CBC with Differential/Platelet     Status: Abnormal   Collection Time: 12/28/20  6:44 PM  Result Value Ref Range   WBC 6.5 4.0 - 10.5 K/uL   RBC 3.92 3.87 - 5.11 MIL/uL    Hemoglobin 11.9 (L) 12.0 - 15.0 g/dL   HCT 34.8 (L) 36.0 - 46.0 %   MCV 88.8 80.0 - 100.0 fL   MCH 30.4 26.0 - 34.0 pg   MCHC 34.2 30.0 - 36.0 g/dL   RDW 12.0 11.5 - 15.5 %   Platelets 192 150 - 400 K/uL   nRBC  0.0 0.0 - 0.2 %   Neutrophils Relative % 64 %   Neutro Abs 4.2 1.7 - 7.7 K/uL   Lymphocytes Relative 24 %   Lymphs Abs 1.5 0.7 - 4.0 K/uL   Monocytes Relative 11 %   Monocytes Absolute 0.7 0.1 - 1.0 K/uL   Eosinophils Relative 0 %   Eosinophils Absolute 0.0 0.0 - 0.5 K/uL   Basophils Relative 0 %   Basophils Absolute 0.0 0.0 - 0.1 K/uL   Immature Granulocytes 1 %   Abs Immature Granulocytes 0.09 (H) 0.00 - 0.07 K/uL  Comprehensive metabolic panel     Status: Abnormal   Collection Time: 12/28/20  6:44 PM  Result Value Ref Range   Sodium 130 (L) 135 - 145 mmol/L   Potassium 3.1 (L) 3.5 - 5.1 mmol/L   Chloride 100 98 - 111 mmol/L   CO2 20 (L) 22 - 32 mmol/L   Glucose, Bld 94 70 - 99 mg/dL   BUN <5 (L) 6 - 20 mg/dL   Creatinine, Ser 0.55 0.44 - 1.00 mg/dL   Calcium 8.4 (L) 8.9 - 10.3 mg/dL   Total Protein 6.5 6.5 - 8.1 g/dL   Albumin 2.5 (L) 3.5 - 5.0 g/dL   AST 19 15 - 41 U/L   ALT 17 0 - 44 U/L   Alkaline Phosphatase 78 38 - 126 U/L   Total Bilirubin 0.4 0.3 - 1.2 mg/dL   GFR, Estimated >60 >60 mL/min   Anion gap 10 5 - 15    MDM Exam Labs: CBC, UA, CMP Decongestant  Analgesic Assessment and Plan  25 year old G1P0 SIUP at 19.1 weeks Headache Tonsil Enlargement  -Exam performed and findings discussed. -Labs ordered. -Reviewed usage of medications for c/o headache pressure. -Patient agreeable to sudafed and tylenol. -Discussed referral to ENT for tonsil enlargement, which was also noted on exam in Nov 30, 2020. -Will await results and reassess.  Maryann Conners 12/28/2020, 6:41 PM   Reassessment (8:20 PM) Hypokalemia  -Patient reports improvement in headache. -Requests medication to be sent to pharmacy on file. -Script for Fioricet and Sudafed sent.   -Reviewed labs and informed of low K+ -Will send script for [place].  Instructed to take for ~2 weeks. -Patient requests and given note for work. -Informed that Covid test still pending, but results will be sent via mychart. -Encouraged to call or return to MAU if symptoms worsen or with the onset of new symptoms. -Discharged to home in stable condition.  Maryann Conners MSN, CNM Advanced Practice Provider, [facility]

## 2020-12-29 LAB — SARS CORONAVIRUS 2 (TAT 6-24 HRS): SARS Coronavirus 2: NEGATIVE

## 2021-01-04 ENCOUNTER — Ambulatory Visit (INDEPENDENT_AMBULATORY_CARE_PROVIDER_SITE_OTHER): Payer: Medicaid Other | Admitting: Advanced Practice Midwife

## 2021-01-04 ENCOUNTER — Other Ambulatory Visit: Payer: Self-pay

## 2021-01-04 ENCOUNTER — Encounter: Payer: Self-pay | Admitting: Advanced Practice Midwife

## 2021-01-04 VITALS — BP 119/71 | HR 94 | Wt 179.0 lb

## 2021-01-04 DIAGNOSIS — Z3A20 20 weeks gestation of pregnancy: Secondary | ICD-10-CM

## 2021-01-04 DIAGNOSIS — J069 Acute upper respiratory infection, unspecified: Secondary | ICD-10-CM

## 2021-01-04 DIAGNOSIS — Z3400 Encounter for supervision of normal first pregnancy, unspecified trimester: Secondary | ICD-10-CM

## 2021-01-04 NOTE — Progress Notes (Signed)
   PRENATAL VISIT NOTE  Subjective:  Jessica Hansen is a 25 y.o. G1P0 at [redacted]w[redacted]d being seen today for ongoing prenatal care.  She is currently monitored for the following issues for this low-risk pregnancy and has Supervision of normal first pregnancy, antepartum and Elevated hemoglobin A1c on their problem list.  Patient reports no complaints and URI improved, no further symptoms, covid negative.  Contractions: Not present. Vag. Bleeding: None.  Movement: Present. Denies leaking of fluid.   The following portions of the patient's history were reviewed and updated as appropriate: allergies, current medications, past family history, past medical history, past social history, past surgical history and problem list.   Objective:   Vitals:   01/04/21 1018  BP: 119/71  Pulse: 94  Weight: 179 lb (81.2 kg)    Fetal Status: Fetal Heart Rate (bpm): 140   Movement: Present     General:  Alert, oriented and cooperative. Patient is in no acute distress.  Skin: Skin is warm and dry. No rash noted.   Cardiovascular: Normal heart rate noted  Respiratory: Normal respiratory effort, no problems with respiration noted  Abdomen: Soft, gravid, appropriate for gestational age.  Pain/Pressure: Absent     Pelvic: Cervical exam deferred        Extremities: Normal range of motion.  Edema: None  Mental Status: Normal mood and affect. Normal behavior. Normal judgment and thought content.   Assessment and Plan:  Pregnancy: G1P0 at [redacted]w[redacted]d 1. [redacted] weeks gestation of pregnancy     Anatomy US normal      Does not want to know gender yet - AFP TETRA  2. Supervision of normal first pregnancy, antepartum  - AFP TETRA  3. Viral upper respiratory tract infection     Resolved  Preterm labor symptoms and general obstetric precautions including but not limited to vaginal bleeding, contractions, leaking of fluid and fetal movement were reviewed in detail with the patient. Please refer to After Visit Summary for other  counseling recommendations.   Return in about 4 weeks (around 02/01/2021) for [facility].  Future Appointments  Date Time Provider [place]  02/01/2021 10:45 AM Seabron Spates, CNM [facility] None    Hansel Feinstein, CNM

## 2021-01-04 NOTE — Patient Instructions (Signed)

## 2021-01-05 NOTE — Addendum Note (Signed)
Addended by: Wendelyn Breslow L on: 01/05/2021 11:26 AM   Modules accepted: Orders

## 2021-01-06 LAB — AFP TETRA
DIA Mom Value: 1.85
DIA Value (EIA): 310.24 pg/mL
DSR (By Age)    1 IN: 966
DSR (Second Trimester) 1 IN: 435
Gestational Age: 20.1 WEEKS
MSAFP Mom: 1.07
MSAFP: 60.9 ng/mL
MSHCG Mom: 3.38
MSHCG: 75098 m[IU]/mL
Maternal Age At EDD: 26.3 yr
Osb Risk: 10000
T18 (By Age): 1:3762 {titer}
Test Results:: NEGATIVE
Weight: 179 [lb_av]
uE3 Mom: 0.64
uE3 Value: 1.42 ng/mL

## 2021-01-11 ENCOUNTER — Other Ambulatory Visit: Payer: Self-pay

## 2021-01-11 ENCOUNTER — Other Ambulatory Visit (HOSPITAL_COMMUNITY)
Admission: RE | Admit: 2021-01-11 | Discharge: 2021-01-11 | Disposition: A | Discharge status: Home / Self Care | Payer: Medicaid Other | Source: Ambulatory Visit | Attending: Family Medicine | Admitting: Family Medicine

## 2021-01-11 ENCOUNTER — Ambulatory Visit (INDEPENDENT_AMBULATORY_CARE_PROVIDER_SITE_OTHER): Payer: Self-pay | Admitting: General Practice

## 2021-01-11 VITALS — BP 137/63 | HR 75 | Ht 65.0 in | Wt 180.0 lb

## 2021-01-11 DIAGNOSIS — Z113 Encounter for screening for infections with a predominantly sexual mode of transmission: Secondary | ICD-10-CM | POA: Insufficient documentation

## 2021-01-11 NOTE — Progress Notes (Signed)
Chart reviewed for nurse visit. Agree with plan of care.   Teresa Maples, MD 01/11/21 9:08 PM

## 2021-01-11 NOTE — Progress Notes (Signed)
Patient presents to office today requesting full STD testing. Patient was positive for chlamydia last year in October. Patient was instructed in self swab & specimen was collected. Phlebotomy drew labs as well. Discussed results will be back in 24-48 hours and will be released to mychart. Advised we will reach out with any abnormal results & any needed prescriptions will be sent to pharmacy.  Chase Caller RN BSN 01/11/21

## 2021-01-12 LAB — HEPATITIS C ANTIBODY: Hep C Virus Ab: <0.1 s/co ratio (ref 0.0–0.9)

## 2021-01-12 LAB — HEPATITIS B SURFACE ANTIGEN: Hepatitis B Surface Ag: NEGATIVE

## 2021-01-12 LAB — RPR: RPR Ser Ql: NONREACTIVE

## 2021-01-12 LAB — HIV ANTIBODY (ROUTINE TESTING W REFLEX): HIV Screen 4th Generation wRfx: NONREACTIVE

## 2021-01-13 ENCOUNTER — Telehealth: Payer: Self-pay | Admitting: Family Medicine

## 2021-01-13 LAB — CERVICOVAGINAL ANCILLARY ONLY
Chlamydia: NEGATIVE
Comment: NEGATIVE
Comment: NEGATIVE
Comment: NORMAL
Neisseria Gonorrhea: NEGATIVE
Trichomonas: NEGATIVE

## 2021-01-13 NOTE — Telephone Encounter (Signed)
Patient is requesting a call about her results.

## 2021-01-13 NOTE — Telephone Encounter (Signed)
Pt also sent Mychart message and  Received a response via that portal.

## 2021-01-17 DIAGNOSIS — Z3400 Encounter for supervision of normal first pregnancy, unspecified trimester: Secondary | ICD-10-CM

## 2021-02-01 ENCOUNTER — Other Ambulatory Visit: Payer: Self-pay

## 2021-02-01 ENCOUNTER — Other Ambulatory Visit (HOSPITAL_COMMUNITY)
Admission: RE | Admit: 2021-02-01 | Discharge: 2021-02-01 | Disposition: A | Discharge status: Home / Self Care | Payer: Medicaid Other | Source: Ambulatory Visit | Attending: Advanced Practice Midwife | Admitting: Advanced Practice Midwife

## 2021-02-01 ENCOUNTER — Encounter: Payer: Self-pay | Admitting: Advanced Practice Midwife

## 2021-02-01 ENCOUNTER — Ambulatory Visit (INDEPENDENT_AMBULATORY_CARE_PROVIDER_SITE_OTHER): Payer: Medicaid Other | Admitting: Advanced Practice Midwife

## 2021-02-01 VITALS — BP 121/75 | HR 93 | Wt 181.0 lb

## 2021-02-01 DIAGNOSIS — N898 Other specified noninflammatory disorders of vagina: Secondary | ICD-10-CM | POA: Diagnosis not present

## 2021-02-01 DIAGNOSIS — Z3A24 24 weeks gestation of pregnancy: Secondary | ICD-10-CM

## 2021-02-01 DIAGNOSIS — O26842 Uterine size-date discrepancy, second trimester: Secondary | ICD-10-CM

## 2021-02-01 NOTE — Progress Notes (Signed)
   PRENATAL VISIT NOTE  Subjective:  Jessica Hansen is a 25 y.o. G1P0 at [redacted]w[redacted]d being seen today for ongoing prenatal care.  She is currently monitored for the following issues for this low-risk pregnancy and has Supervision of normal first pregnancy, antepartum and Elevated hemoglobin A1c on their problem list.  Patient reports no complaints.  Contractions: Not present. Vag. Bleeding: None.  Movement: Present. Denies leaking of fluid.   The following portions of the patient's history were reviewed and updated as appropriate: allergies, current medications, past family history, past medical history, past social history, past surgical history and problem list.   Objective:   Vitals:   02/01/21 1034  BP: 121/75  Pulse: 93  Weight: 181 lb (82.1 kg)    Fetal Status: Fetal Heart Rate (bpm): 150   Movement: Present     General:  Alert, oriented and cooperative. Patient is in no acute distress.  Skin: Skin is warm and dry. No rash noted.   Cardiovascular: Normal heart rate noted  Respiratory: Normal respiratory effort, no problems with respiration noted  Abdomen: Soft, gravid, appropriate for gestational age.  Pain/Pressure: Absent     Pelvic: Cervical exam deferred        Extremities: Normal range of motion.  Edema: None  Mental Status: Normal mood and affect. Normal behavior. Normal judgment and thought content.   Assessment and Plan:  Pregnancy: G1P0 at [redacted]w[redacted]d 1. [redacted] weeks gestation of pregnancy   2. Vaginal discharge      Probably BV, will test - Cervicovaginal ancillary only( )  3. Uterine size-date discrepancy in second trimester      Watch FH,  If persistently greater than dates, will get [place]  Preterm labor symptoms and general obstetric precautions including but not limited to vaginal bleeding, contractions, leaking of fluid and fetal movement were reviewed in detail with the patient. Please refer to After Visit Summary for other counseling recommendations.    Return in about 3 weeks (around 02/22/2021) for [facility].  Future Appointments  Date Time Provider [place]  03/03/2021  8:45 AM Truett Mainland, DO [facility] None    Hansel Feinstein, CNM

## 2021-02-01 NOTE — Patient Instructions (Signed)
Bacterial Vaginosis  Bacterial vaginosis is an infection of the vagina. It happens when too many normal germs (healthy bacteria) grow in the vagina. This infection can make it easier to get other infections from sex (STIs). It is very important for pregnant women to get treated. This infection can cause babies to be born early or at a low birth weight. What are the causes? This infection is caused by an increase in certain germs that grow in the vagina. You cannot get this infection from toilet seats, bedsheets, swimming pools, or things that touch your vagina. What increases the risk?  Having sex with a new person or more than one person.  Having sex without protection.  Douching.  Having an intrauterine device (IUD).  Smoking.  Using drugs or drinking alcohol. These can lead you to do things that are risky.  Taking certain antibiotic medicines.  Being pregnant. What are the signs or symptoms? Some women have no symptoms. Symptoms may include:  A discharge from your vagina. It may be gray or white. It can be watery or foamy.  A fishy smell. This can happen after sex or during your menstrual period.  Itching in and around your vagina.  A feeling of burning or pain when you pee (urinate). How is this treated? This infection is treated with antibiotic medicines. These may be given to you as:  A pill.  A cream for your vagina.  A medicine that you put into your vagina (suppository). If the infection comes back after treatment, you may need more antibiotics. Follow these instructions at home: Medicines  Take over-the-counter and prescription medicines as told by your doctor.  Take or use your antibiotic medicine as told by your doctor. Do not stop taking or using it, even if you start to feel better. General instructions  If the person you have sex with is a woman, tell her that you have this infection. She will need to follow up with her doctor. If you have a female  partner, he does not need to be treated.  Do not have sex until you finish treatment.  Drink enough fluid to keep your pee pale yellow.  Keep your vagina and butt clean. ? Wash the area with warm water each day. ? Wipe from front to back after you use the toilet.  If you are breastfeeding a baby, ask your doctor if you should keep doing so during treatment.  Keep all follow-up visits. How is this prevented? Self-care  Do not douche.  Use only warm water to wash around your vagina.  Wear underwear that is cotton or lined with cotton.  Do not wear tight pants and pantyhose, especially in the summer. Safe sex  Use protection when you have sex. This includes: ? Use condoms. ? Use dental dams. This is a thin layer that protects the mouth during oral sex.  Limit how many people you have sex with. To prevent this infection, it is best to have sex with just one person.  Get tested for STIs. The person you have sex with should also get tested. Drugs and alcohol  Do not smoke or use any products that contain nicotine or tobacco. If you need help quitting, ask your doctor.  Do not use drugs.  Do not drink alcohol if: ? Your doctor tells you not to drink. ? You are pregnant, may be pregnant, or are planning to become pregnant.  If you drink alcohol: ? Limit how much you have to 0-1 drink   a day. ? Know how much alcohol is in your drink. In the [place] one drink equals one 12 oz bottle of beer (355 mL), one 5 oz glass of wine (148 mL), or one 1 oz glass of hard liquor (44 mL). Where to find more information  Centers for Disease Control and Prevention: http://www.wolf.info/  American Sexual Health Association: www.ashastd.org  Office on [employer]: [place]Signs.tn Contact a doctor if:  Your symptoms do not get better, even after you are treated.  You have more discharge or pain when you pee.  You have a fever or chills.  You have pain in your belly (abdomen) or in the area  between your hips.  You have pain with sex.  You bleed from your vagina between menstrual periods. Summary  This infection can happen when too many germs (bacteria) grow in the vagina.  This infection can make it easier to get infections from sex (STIs). Treating this can lower that chance.  Get treated if you are pregnant. This infection can cause babies to be born early.  Do not stop taking or using your antibiotic medicine, even if you start to feel better. This information is not intended to replace advice given to you by your health care provider. Make sure you discuss any questions you have with your health care provider. Document Revised: 05/06/2020 Document Reviewed: 05/06/2020 Elsevier Patient Education  2021 [place] of Pregnancy  The second trimester of pregnancy is from week 13 through week 27. This is also called months 4 through 6 of pregnancy. This is often the time when you feel your best. During the second trimester:  Morning sickness is less or has stopped.  You may have more energy.  You may feel hungry more often. At this time, your unborn baby (fetus) is growing very fast. At the end of the sixth month, the unborn baby may be up to 12 inches long and weigh about 1 pounds. You will likely start to feel the baby move between 16 and 20 weeks of pregnancy. Body changes during your second trimester Your body continues to go through many changes during this time. The changes vary and generally return to normal after the baby is born. Physical changes  You will gain more weight.  You may start to get stretch marks on your hips, belly (abdomen), and breasts.  Your breasts will grow and may hurt.  Dark spots or blotches may develop on your face.  A dark line from your belly button to the pubic area (linea nigra) may appear.  You may have changes in your hair. Health changes  You may have headaches.  You may have heartburn.  You may  have trouble pooping (constipation).  You may have hemorrhoids or swollen, bulging veins (varicose veins).  Your gums may bleed.  You may pee (urinate) more often.  You may have back pain. Follow these instructions at home: Medicines  Take over-the-counter and prescription medicines only as told by your doctor. Some medicines are not safe during pregnancy.  Take a prenatal vitamin that contains at least 600 micrograms (mcg) of folic acid. Eating and drinking  Eat healthy meals that include: ? Fresh fruits and vegetables. ? Whole grains. ? Good sources of protein, such as meat, eggs, or tofu. ? Low-fat dairy products.  Avoid raw meat and unpasteurized juice, milk, and cheese.  You may need to take these actions to prevent or treat trouble pooping: ? Drink enough fluids to keep your pee (  urine) pale yellow. ? Eat foods that are high in fiber. These include beans, whole grains, and fresh fruits and vegetables. ? Limit foods that are high in fat and sugar. These include fried or sweet foods. Activity  Exercise only as told by your doctor. Most people can do their usual exercise during pregnancy. Try to exercise for 30 minutes at least 5 days a week.  Stop exercising if you have pain or cramps in your belly or lower back.  Do not exercise if it is too hot or too humid, or if you are in a place of great height (high altitude).  Avoid heavy lifting.  If you choose to, you may have sex unless your doctor tells you not to. Relieving pain and discomfort  Wear a good support bra if your breasts are sore.  Take warm water baths (sitz baths) to soothe pain or discomfort caused by hemorrhoids. Use hemorrhoid cream if your doctor approves.  Rest with your legs raised (elevated) if you have leg cramps or low back pain.  If you develop bulging veins in your legs: ? Wear support hose as told by your doctor. ? Raise your feet for 15 minutes, 3-4 times a day. ? Limit salt in your  food. Safety  Wear your seat belt at all times when you are in a car.  Talk with your doctor if someone is hurting you or yelling at you a lot. Lifestyle  Do not use hot tubs, steam rooms, or saunas.  Do not douche. Do not use tampons or scented sanitary pads.  Avoid cat litter boxes and soil used by cats. These carry germs that can harm your baby and can cause a loss of your baby by miscarriage or stillbirth.  Do not use herbal medicines, illegal drugs, or medicines that are not approved by your doctor. Do not drink alcohol.  Do not smoke or use any products that contain nicotine or tobacco. If you need help quitting, ask your doctor. General instructions  Keep all follow-up visits. This is important.  Ask your doctor about local prenatal classes.  Ask your doctor about the right foods to eat or for help finding a counselor. Where to find more information  American Pregnancy Association: americanpregnancy.org  [employer] of Obstetricians and Gynecologists: www.acog.org  Office on [employer]: KeywordPortfolios.com.br Contact a doctor if:  You have a headache that does not go away when you take medicine.  You have changes in how you see, or you see spots in front of your eyes.  You have mild cramps, pressure, or pain in your lower belly.  You continue to feel like you may vomit (nauseous), you vomit, or you have watery poop (diarrhea).  You have bad-smelling fluid coming from your vagina.  You have pain when you pee or your pee smells bad.  You have very bad swelling of your face, hands, ankles, feet, or legs.  You have a fever. Get help right away if:  You are leaking fluid from your vagina.  You have spotting or bleeding from your vagina.  You have very bad belly cramping or pain.  You have trouble breathing.  You have chest pain.  You faint.  You have not felt your baby move for the time period told by your doctor.  You have new or  increased pain, swelling, or redness in an arm or leg. Summary  The second trimester of pregnancy is from week 13 through week 27 (months 4 through 6).  Eat  healthy meals.  Exercise as told by your doctor. Most people can do their usual exercise during pregnancy.  Do not use herbal medicines, illegal drugs, or medicines that are not approved by your doctor. Do not drink alcohol.  Call your doctor if you get sick or if you notice anything unusual about your pregnancy. This information is not intended to replace advice given to you by your health care provider. Make sure you discuss any questions you have with your health care provider. Document Revised: 04/14/2020 Document Reviewed: 02/19/2020 Elsevier Patient Education  2021 [employer].

## 2021-02-02 ENCOUNTER — Encounter: Payer: Self-pay | Admitting: Advanced Practice Midwife

## 2021-02-02 ENCOUNTER — Telehealth: Payer: Self-pay | Admitting: *Deleted

## 2021-02-02 DIAGNOSIS — O23599 Infection of other part of genital tract in pregnancy, unspecified trimester: Secondary | ICD-10-CM | POA: Insufficient documentation

## 2021-02-02 DIAGNOSIS — B9689 Other specified bacterial agents as the cause of diseases classified elsewhere: Secondary | ICD-10-CM | POA: Insufficient documentation

## 2021-02-02 LAB — CERVICOVAGINAL ANCILLARY ONLY
Bacterial Vaginitis (gardnerella): POSITIVE — AB
Comment: NEGATIVE

## 2021-02-02 MED ORDER — METRONIDAZOLE 500 MG PO TABS: 500.0000 mg | ORAL_TABLET | Freq: Two times a day (BID) | ORAL | 0 refills | Status: DC

## 2021-02-02 NOTE — Telephone Encounter (Signed)
Pt notified of abnormal Aptima which shows BV and per protocol Flagyl 500 mg sent to [place].

## 2021-02-23 ENCOUNTER — Telehealth: Payer: Self-pay

## 2021-02-23 NOTE — Telephone Encounter (Signed)
Pt called stating she had sex and noticed a little blood after she wiped. Pt made aware that a little spotting is normal after sex during pregnancy because the cervix is friable. I advised her to go to [facility] at [facility] at [facility] [address] in [place] if she starts bleeding heavy like a period. Understanding was voiced. Mihira Tozzi l Marilene Vath, CMA

## 2021-03-03 ENCOUNTER — Ambulatory Visit (INDEPENDENT_AMBULATORY_CARE_PROVIDER_SITE_OTHER): Payer: Medicaid Other | Admitting: Family Medicine

## 2021-03-03 ENCOUNTER — Other Ambulatory Visit: Payer: Self-pay

## 2021-03-03 VITALS — BP 107/68 | HR 84 | Wt 181.0 lb

## 2021-03-03 DIAGNOSIS — Z3483 Encounter for supervision of other normal pregnancy, third trimester: Secondary | ICD-10-CM

## 2021-03-03 DIAGNOSIS — Z3A28 28 weeks gestation of pregnancy: Secondary | ICD-10-CM

## 2021-03-03 DIAGNOSIS — Z23 Encounter for immunization: Secondary | ICD-10-CM | POA: Diagnosis not present

## 2021-03-03 DIAGNOSIS — Z3400 Encounter for supervision of normal first pregnancy, unspecified trimester: Secondary | ICD-10-CM

## 2021-03-03 MED ORDER — FAMOTIDINE 20 MG PO TABS: 20.0000 mg | ORAL_TABLET | Freq: Two times a day (BID) | ORAL | 3 refills | Status: DC

## 2021-03-03 NOTE — Progress Notes (Signed)
  History:  Ms. Jessica Hansen is a 25 y.o. G1P0 at [redacted]w[redacted]d y LMP who presents to clinic today for routine OB visit. She reports some difficulty taking a deep breath when lying flat. Resolves when sitting up. No cough, wheezing, or chest pain. No DOE. She also reports GERD symptoms after eating and takes TUMs after every meal with improvement. Otherwise, she is doing well. +FM. No LOF or VG.   The following portions of the patient's history were reviewed and updated as appropriate: allergies, current medications, family history, past medical history, social history, past surgical history and problem list.  PMH:  No past medical history  Review of Systems:  Review of Systems  All other systems reviewed and are negative.     Objective:  Physical Exam BP 107/68   Pulse 84   Wt 181 lb (82.1 kg)   LMP 08/16/2020   BMI 32.06 kg/m  Physical Exam Constitutional:      General: She is not in acute distress. Cardiovascular:     Rate and Rhythm: Normal rate.  Pulmonary:     Effort: Pulmonary effort is normal.  Abdominal:     Palpations: Abdomen is soft.     Tenderness: There is no abdominal tenderness.  Musculoskeletal:        General: No swelling.      Assessment & Plan:  1. [redacted] weeks gestation of pregnancy - Fundal height ~28 cm  - 2 hour GTT - CBC - ID labs: HIV and RPR    2. Difficulty breathing when supine - Discussed this is likely compression of diaphragm. Reassurred this resolves when sitting up. Recommended raise bed or pillow support  3. GERD - Pepcid 20 mg BID    Approximately 15 minutes of total time was spent with this patient on routine OB visit.   Jule Economy, Medical Student 03/03/2021 8:10 AM

## 2021-03-04 LAB — CBC
Hematocrit: 35.7 % (ref 34.0–46.6)
Hemoglobin: 12.2 g/dL (ref 11.1–15.9)
MCH: 29.9 pg (ref 26.6–33.0)
MCHC: 34.2 g/dL (ref 31.5–35.7)
MCV: 88 fL (ref 79–97)
Platelets: 276 10*3/uL (ref 150–450)
RBC: 4.08 x10E6/uL (ref 3.77–5.28)
RDW: 12.4 % (ref 11.7–15.4)
WBC: 8 10*3/uL (ref 3.4–10.8)

## 2021-03-04 LAB — RPR: RPR Ser Ql: NONREACTIVE

## 2021-03-04 LAB — GLUCOSE TOLERANCE, 2 HOURS W/ 1HR
Glucose, 1 hour: 168 mg/dL (ref 65–179)
Glucose, 2 hour: 132 mg/dL (ref 65–152)
Glucose, Fasting: 87 mg/dL (ref 65–91)

## 2021-03-04 LAB — HIV ANTIBODY (ROUTINE TESTING W REFLEX): HIV Screen 4th Generation wRfx: NONREACTIVE

## 2021-03-18 ENCOUNTER — Ambulatory Visit (INDEPENDENT_AMBULATORY_CARE_PROVIDER_SITE_OTHER): Payer: Medicaid Other | Admitting: Family Medicine

## 2021-03-18 ENCOUNTER — Other Ambulatory Visit: Payer: Self-pay

## 2021-03-18 VITALS — BP 122/70 | HR 89 | Wt 184.0 lb

## 2021-03-18 DIAGNOSIS — Z3400 Encounter for supervision of normal first pregnancy, unspecified trimester: Secondary | ICD-10-CM

## 2021-03-18 DIAGNOSIS — Z3A30 30 weeks gestation of pregnancy: Secondary | ICD-10-CM

## 2021-03-18 NOTE — Progress Notes (Signed)
   PRENATAL VISIT NOTE  Subjective:  Jessica Hansen is a 25 y.o. G1P0 at [redacted]w[redacted]d being seen today for ongoing prenatal care.  She is currently monitored for the following issues for this low-risk pregnancy and has Supervision of normal first pregnancy, antepartum; Elevated hemoglobin A1c; and Bacterial vaginosis in pregnancy on their problem list.  Patient reports no complaints.  Contractions: Not present. Vag. Bleeding: None.  Movement: Present. Denies leaking of fluid.   The following portions of the patient's history were reviewed and updated as appropriate: allergies, current medications, past family history, past medical history, past social history, past surgical history and problem list.   Objective:   Vitals:   03/18/21 1109  BP: 122/70  Pulse: 89  Weight: 184 lb (83.5 kg)    Fetal Status: Fetal Heart Rate (bpm): 145 Fundal Height: 31 cm Movement: Present     General:  Alert, oriented and cooperative. Patient is in no acute distress.  Skin: Skin is warm and dry. No rash noted.   Cardiovascular: Normal heart rate noted  Respiratory: Normal respiratory effort, no problems with respiration noted  Abdomen: Soft, gravid, appropriate for gestational age.  Pain/Pressure: Absent     Pelvic: Cervical exam deferred        Extremities: Normal range of motion.  Edema: None  Mental Status: Normal mood and affect. Normal behavior. Normal judgment and thought content.   Assessment and Plan:  Pregnancy: G1P0 at [redacted]w[redacted]d 1. [redacted] weeks gestation of pregnancy  2. Supervision of normal first pregnancy, antepartum FHT and FH normal   Preterm labor symptoms and general obstetric precautions including but not limited to vaginal bleeding, contractions, leaking of fluid and fetal movement were reviewed in detail with the patient. Please refer to After Visit Summary for other counseling recommendations.   No follow-ups on file.  Future Appointments  Date Time Provider [place]  03/29/2021  10:25 AM Seabron Spates, CNM [facility] None  04/12/2021  8:55 AM Seabron Spates, CNM [facility] None  04/26/2021  8:55 AM Seabron Spates, CNM [facility] None  05/03/2021  8:55 AM Seabron Spates, CNM [facility] None  05/10/2021  8:55 AM Seabron Spates, CNM [facility] None  05/17/2021  8:55 AM Seabron Spates, CNM [facility] None    Truett Mainland, DO

## 2021-03-29 ENCOUNTER — Encounter: Payer: Self-pay | Admitting: Advanced Practice Midwife

## 2021-03-29 ENCOUNTER — Other Ambulatory Visit: Payer: Self-pay

## 2021-03-29 ENCOUNTER — Ambulatory Visit (INDEPENDENT_AMBULATORY_CARE_PROVIDER_SITE_OTHER): Payer: Medicaid Other | Admitting: Advanced Practice Midwife

## 2021-03-29 VITALS — BP 115/73 | HR 79 | Wt 184.0 lb

## 2021-03-29 DIAGNOSIS — Z3A32 32 weeks gestation of pregnancy: Secondary | ICD-10-CM

## 2021-03-29 NOTE — Progress Notes (Signed)
   PRENATAL VISIT NOTE  Subjective:  Jessica Hansen is a 25 y.o. G1P0 at [redacted]w[redacted]d being seen today for ongoing prenatal care.  She is currently monitored for the following issues for this low-risk pregnancy and has Supervision of normal first pregnancy, antepartum; Elevated hemoglobin A1c; and Bacterial vaginosis in pregnancy on their problem list.  Patient reports no complaints.  Contractions: Not present. Vag. Bleeding: None.  Movement: Present. Denies leaking of fluid.   The following portions of the patient's history were reviewed and updated as appropriate: allergies, current medications, past family history, past medical history, past social history, past surgical history and problem list.   Objective:   Vitals:   03/29/21 1008  BP: 115/73  Pulse: 79  Weight: 184 lb (83.5 kg)    Fetal Status:     Movement: Present     General:  Alert, oriented and cooperative. Patient is in no acute distress.  Skin: Skin is warm and dry. No rash noted.   Cardiovascular: Normal heart rate noted  Respiratory: Normal respiratory effort, no problems with respiration noted  Abdomen: Soft, gravid, appropriate for gestational age.  Pain/Pressure: Present     Pelvic: Cervical exam deferred        Extremities: Normal range of motion.  Edema: None  Mental Status: Normal mood and affect. Normal behavior. Normal judgment and thought content.   Assessment and Plan:  Pregnancy: G1P0 at [redacted]w[redacted]d 1. [redacted] weeks gestation of pregnancy     Reviewed Fetal movement, PTL signs  Preterm labor symptoms and general obstetric precautions including but not limited to vaginal bleeding, contractions, leaking of fluid and fetal movement were reviewed in detail with the patient. Please refer to After Visit Summary for other counseling recommendations.   Return in about 2 weeks (around 04/12/2021) for [facility].  Future Appointments  Date Time Provider [place]  04/12/2021  8:55 AM Seabron Spates, CNM  [facility] None  04/26/2021  8:55 AM Seabron Spates, CNM [facility] None  05/03/2021  8:55 AM Seabron Spates, CNM [facility] None  05/10/2021  8:55 AM Seabron Spates, CNM [facility] None  05/17/2021  8:55 AM Seabron Spates, CNM [facility] None    Hansel Feinstein, CNM

## 2021-03-29 NOTE — Patient Instructions (Signed)

## 2021-04-12 ENCOUNTER — Ambulatory Visit (INDEPENDENT_AMBULATORY_CARE_PROVIDER_SITE_OTHER): Payer: Medicaid Other | Admitting: Medical

## 2021-04-12 ENCOUNTER — Other Ambulatory Visit: Payer: Self-pay

## 2021-04-12 VITALS — BP 136/75 | HR 103 | Temp 98.1°F | Wt 194.0 lb

## 2021-04-12 DIAGNOSIS — Z3400 Encounter for supervision of normal first pregnancy, unspecified trimester: Secondary | ICD-10-CM

## 2021-04-12 DIAGNOSIS — G479 Sleep disorder, unspecified: Secondary | ICD-10-CM

## 2021-04-12 DIAGNOSIS — R102 Pelvic and perineal pain: Secondary | ICD-10-CM

## 2021-04-12 DIAGNOSIS — O99891 Other specified diseases and conditions complicating pregnancy: Secondary | ICD-10-CM

## 2021-04-12 DIAGNOSIS — O26893 Other specified pregnancy related conditions, third trimester: Secondary | ICD-10-CM

## 2021-04-12 DIAGNOSIS — R7309 Other abnormal glucose: Secondary | ICD-10-CM

## 2021-04-12 DIAGNOSIS — Z3A34 34 weeks gestation of pregnancy: Secondary | ICD-10-CM

## 2021-04-12 DIAGNOSIS — D251 Intramural leiomyoma of uterus: Secondary | ICD-10-CM

## 2021-04-12 DIAGNOSIS — D259 Leiomyoma of uterus, unspecified: Secondary | ICD-10-CM | POA: Insufficient documentation

## 2021-04-12 DIAGNOSIS — O23599 Infection of other part of genital tract in pregnancy, unspecified trimester: Secondary | ICD-10-CM

## 2021-04-12 DIAGNOSIS — M549 Dorsalgia, unspecified: Secondary | ICD-10-CM

## 2021-04-12 DIAGNOSIS — B9689 Other specified bacterial agents as the cause of diseases classified elsewhere: Secondary | ICD-10-CM

## 2021-04-12 MED ORDER — CYCLOBENZAPRINE HCL 10 MG PO TABS: 10.0000 mg | ORAL_TABLET | Freq: Two times a day (BID) | ORAL | 0 refills | Status: DC | PRN

## 2021-04-12 NOTE — Patient Instructions (Signed)
Fetal Movement Counts Patient Name: ________________________________________________ Patient Due Date: ____________________  What is a fetal movement count? A fetal movement count is the number of times that you feel your baby move during a certain amount of time. This may also be called a fetal kick count. A fetal movement count is recommended for every pregnant woman. You may be asked to start counting fetal movements as early as week 28 of your pregnancy. Pay attention to when your baby is most active. You may notice your baby's sleep and wake cycles. You may also notice things that make your baby move more. You should do a fetal movement count:  When your baby is normally most active.  At the same time each day. A good time to count movements is while you are resting, after having something to eat and drink. How do I count fetal movements? 1. Find a quiet, comfortable area. Sit, or lie down on your side. 2. Write down the date, the start time and stop time, and the number of movements that you felt between those two times. Take this information with you to your health care visits. 3. Write down your start time when you feel the first movement. 4. Count kicks, flutters, swishes, rolls, and jabs. You should feel at least 10 movements. 5. You may stop counting after you have felt 10 movements, or if you have been counting for 2 hours. Write down the stop time. 6. If you do not feel 10 movements in 2 hours, contact your health care provider for further instructions. Your health care provider may want to do additional tests to assess your baby's well-being. Contact a health care provider if:  You feel fewer than 10 movements in 2 hours.  Your baby is not moving like he or she usually does. Date: ____________ Start time: ____________ Stop time: ____________ Movements: ____________ Date: ____________ Start time: ____________ Stop time: ____________ Movements: ____________ Date: ____________  Start time: ____________ Stop time: ____________ Movements: ____________ Date: ____________ Start time: ____________ Stop time: ____________ Movements: ____________ Date: ____________ Start time: ____________ Stop time: ____________ Movements: ____________ Date: ____________ Start time: ____________ Stop time: ____________ Movements: ____________ Date: ____________ Start time: ____________ Stop time: ____________ Movements: ____________ Date: ____________ Start time: ____________ Stop time: ____________ Movements: ____________ Date: ____________ Start time: ____________ Stop time: ____________ Movements: ____________ This information is not intended to replace advice given to you by your health care provider. Make sure you discuss any questions you have with your health care provider. Document Revised: 06/26/2019 Document Reviewed: 06/26/2019 Elsevier Patient Education  [place]. [employer]'s Emergency Medicine: Concepts and Clinical Practice (9th ed., pp. 2296- 2312). Elsevier.">  Braxton Hicks Contractions Contractions of the uterus can occur throughout pregnancy, but they are not always a sign that you are in labor. You may have practice contractions called Braxton Hicks contractions. These false labor contractions are sometimes confused with true labor. What are [place] contractions? Braxton Hicks contractions are tightening movements that occur in the muscles of the uterus before labor. Unlike true labor contractions, these contractions do not result in opening (dilation) and thinning of the cervix. Toward the end of pregnancy (32-34 weeks), Braxton Hicks contractions can happen more often and may become stronger. These contractions are sometimes difficult to tell apart from true labor because they can be very uncomfortable. You should not feel embarrassed if you go to the hospital with false labor. Sometimes, the only way to tell if you are in true labor is for your  health care  provider to look for changes in the cervix. The health care provider will do a physical exam and may monitor your contractions. If you are not in true labor, the exam should show that your cervix is not dilating and your water has not broken. If there are no other health problems associated with your pregnancy, it is completely safe for you to be sent home with false labor. You may continue to have Braxton Hicks contractions until you go into true labor. How to tell the difference between true labor and false labor True labor  Contractions last 30-70 seconds.  Contractions become very regular.  Discomfort is usually felt in the top of the uterus, and it spreads to the lower abdomen and low back.  Contractions do not go away with walking.  Contractions usually become more intense and increase in frequency.  The cervix dilates and gets thinner. False labor  Contractions are usually shorter and not as strong as true labor contractions.  Contractions are usually irregular.  Contractions are often felt in the front of the lower abdomen and in the groin.  Contractions may go away when you walk around or change positions while lying down.  Contractions get weaker and are shorter-lasting as time goes on.  The cervix usually does not dilate or become thin. Follow these instructions at home:  Take over-the-counter and prescription medicines only as told by your health care provider.  Keep up with your usual exercises and follow other instructions from your health care provider.  Eat and drink lightly if you think you are going into labor.  If Braxton Hicks contractions are making you uncomfortable: ? Change your position from lying down or resting to walking, or change from walking to resting. ? Sit and rest in a tub of warm water. ? Drink enough fluid to keep your urine pale yellow. Dehydration may cause these contractions. ? Do slow and deep breathing several times an hour.  Keep  all follow-up prenatal visits as told by your health care provider. This is important.   Contact a health care provider if:  You have a fever.  You have continuous pain in your abdomen. Get help right away if:  Your contractions become stronger, more regular, and closer together.  You have fluid leaking or gushing from your vagina.  You pass blood-tinged mucus (bloody show).  You have bleeding from your vagina.  You have low back pain that you never had before.  You feel your baby's head pushing down and causing pelvic pressure.  Your baby is not moving inside you as much as it used to. Summary  Contractions that occur before labor are called Braxton Hicks contractions, false labor, or practice contractions.  Braxton Hicks contractions are usually shorter, weaker, farther apart, and less regular than true labor contractions. True labor contractions usually become progressively stronger and regular, and they become more frequent.  Manage discomfort from [facility] contractions by changing position, resting in a warm bath, drinking plenty of water, or practicing deep breathing. This information is not intended to replace advice given to you by your health care provider. Make sure you discuss any questions you have with your health care provider. Document Revised: 10/19/2017 Document Reviewed: 03/22/2017 Elsevier Patient Education  [place] Medications in Pregnancy   Acne:  Benzoyl Peroxide  Salicylic Acid   Backache/Headache:  Tylenol: 2 regular strength every 4 hours OR        2 Extra strength  every 6 hours   Colds/Coughs/Allergies:  Benadryl (alcohol free) 25 mg every 6 hours as needed  Breath right strips  Claritin  Cepacol throat lozenges  Chloraseptic throat spray  Cold-Eeze- up to three times per day  Cough drops, alcohol free  Flonase (by prescription only)  Guaifenesin  Mucinex  Robitussin DM (plain only, alcohol free)  Saline  nasal spray/drops  Sudafed (pseudoephedrine) & Actifed * use only after [redacted] weeks gestation and if you do not have high blood pressure  Tylenol  Vicks Vaporub  Zinc lozenges  Zyrtec   Constipation:  Colace  Ducolax suppositories  Fleet enema  Glycerin suppositories  Metamucil  Milk of magnesia  Miralax  Senokot  Smooth move tea   Diarrhea:  Kaopectate  Imodium A-D   *NO pepto Bismol   Hemorrhoids:  Anusol  Anusol HC  Preparation H  Tucks   Indigestion:  Tums  Maalox  Mylanta  Zantac  Pepcid   Insomnia:  Benadryl (alcohol free) 25mg  every 6 hours as needed  Tylenol PM  Unisom, no Gelcaps   Leg Cramps:  Tums  MagGel   Nausea/Vomiting:  Bonine  Dramamine  Emetrol  Ginger extract  Sea bands  Meclizine  Nausea medication to take during pregnancy:  Unisom (doxylamine succinate 25 mg tablets) Take one tablet daily at bedtime. If symptoms are not adequately controlled, the dose can be increased to a maximum recommended dose of two tablets daily (1/2 tablet in the morning, 1/2 tablet mid-afternoon and one at bedtime).  Vitamin B6 100mg  tablets. Take one tablet twice a day (up to 200 mg per day).   Skin Rashes:  Aveeno products  Benadryl cream or 25mg  every 6 hours as needed  Calamine Lotion  1% cortisone cream   Yeast infection:  Gyne-lotrimin 7  Monistat 7    **If taking multiple medications, please check labels to avoid duplicating the same active ingredients  **take medication as directed on the label  ** Do not exceed 4000 mg of tylenol in 24 hours  **Do not take medications that contain aspirin or ibuprofen

## 2021-04-12 NOTE — Progress Notes (Signed)
   PRENATAL VISIT NOTE  Subjective:  Jessica Hansen is a 25 y.o. G1P0 at [redacted]w[redacted]d being seen today for ongoing prenatal care.  She is currently monitored for the following issues for this low-risk pregnancy and has Supervision of normal first pregnancy, antepartum; Elevated hemoglobin A1c; Bacterial vaginosis in pregnancy; and Uterine fibroid on their problem list.  Patient reports backache, occasional contractions and difficulty sleeping.  Contractions: Irritability. Vag. Bleeding: None.  Movement: Present. Denies leaking of fluid.   The following portions of the patient's history were reviewed and updated as appropriate: allergies, current medications, past family history, past medical history, past social history, past surgical history and problem list.   Objective:   Vitals:   04/12/21 0906  BP: 136/75  Pulse: (!) 103  Temp: 98.1 F (36.7 C)  Weight: 194 lb (88 kg)    Fetal Status: Fetal Heart Rate (bpm): 140 Fundal Height: 36 cm Movement: Present     General:  Alert, oriented and cooperative. Patient is in no acute distress.  Skin: Skin is warm and dry. No rash noted.   Cardiovascular: Normal heart rate noted  Respiratory: Normal respiratory effort, no problems with respiration noted  Abdomen: Soft, gravid, appropriate for gestational age.  Pain/Pressure: Present     Pelvic: Cervical exam deferred        Extremities: Normal range of motion.  Edema: Trace  Mental Status: Normal mood and affect. Normal behavior. Normal judgment and thought content.   Assessment and Plan:  Pregnancy: G1P0 at [redacted]w[redacted]d 1. Bacterial vaginosis in pregnancy  2. Supervision of normal first pregnancy, antepartum - Planning POPs - Encouraged choosing Peds before next visit   3. Elevated hemoglobin A1c - Normal 2 hour  4. Intramural leiomyoma of uterus  5. Back pain affecting pregnancy in third trimester - cyclobenzaprine (FLEXERIL) 10 MG tablet; Take 1 tablet (10 mg total) by mouth 2 (two) times  daily as needed for muscle spasms.  Dispense: 20 tablet; Refill: 0 - Discussed pregnancy belt, hydrotherapy and ice for pain  - tylenol PRN advised   6. Pelvic pressure in pregnancy, antepartum, third trimester  7. Sleep disturbances - Discussed Benadryl, Melatonin and Magnesium for sleep as well as good sleep hygiene practices   8. [redacted] weeks gestation of pregnancy  Preterm labor symptoms and general obstetric precautions including but not limited to vaginal bleeding, contractions, leaking of fluid and fetal movement were reviewed in detail with the patient. Please refer to After Visit Summary for other counseling recommendations.   No follow-ups on file.  Future Appointments  Date Time Provider [place]  04/26/2021  8:55 AM Seabron Spates, CNM [facility] None  05/03/2021  8:55 AM Seabron Spates, CNM [facility] None  05/10/2021  8:55 AM Seabron Spates, CNM [facility] None  05/17/2021  8:55 AM Seabron Spates, CNM [facility] None    Kerry Hough, PA-C

## 2021-04-13 ENCOUNTER — Telehealth: Payer: Self-pay

## 2021-04-13 NOTE — Telephone Encounter (Signed)
Pt called stating that her stomach is hurting from coughing so much. Pt states baby is moving well. States she was told at her appointment yesterday to start taking some sudafed. Pt states she has started taking that but she is still coughing. Advised patient to take at home Covid test to rule out Covid is she is having that much coughing. Also gave patient some alternatives to sudafed such as Robitussin, Mucinex, or Tylenol cold and sinus. Advised patient to rest and push fluids as much as she was able. Told patient the pain in stomach is most likely musculoskeletal pain from coughing so much. Pt advised to call back should symptoms increase or worsen. Pt agreed and verbalized understanding.

## 2021-04-21 ENCOUNTER — Ambulatory Visit (HOSPITAL_COMMUNITY)
Admission: EM | Admit: 2021-04-21 | Discharge: 2021-04-21 | Disposition: A | Discharge status: Home / Self Care | Payer: Self-pay | Attending: Urgent Care | Admitting: Urgent Care

## 2021-04-21 ENCOUNTER — Other Ambulatory Visit: Payer: Self-pay

## 2021-04-21 ENCOUNTER — Encounter (HOSPITAL_COMMUNITY): Payer: Self-pay

## 2021-04-21 DIAGNOSIS — R102 Pelvic and perineal pain: Secondary | ICD-10-CM | POA: Insufficient documentation

## 2021-04-21 DIAGNOSIS — R110 Nausea: Secondary | ICD-10-CM | POA: Insufficient documentation

## 2021-04-21 DIAGNOSIS — R1031 Right lower quadrant pain: Secondary | ICD-10-CM | POA: Insufficient documentation

## 2021-04-21 LAB — POC URINE PREG, ED: Preg Test, Ur: NEGATIVE

## 2021-04-21 LAB — POCT URINALYSIS DIPSTICK, ED / UC
Bilirubin Urine: NEGATIVE
Glucose, UA: NEGATIVE mg/dL
Leukocytes,Ua: NEGATIVE
Nitrite: NEGATIVE
Protein, ur: NEGATIVE mg/dL
Specific Gravity, Urine: 1.02 (ref 1.005–1.030)
Urobilinogen, UA: 0.2 mg/dL (ref 0.0–1.0)
pH: 7 (ref 5.0–8.0)

## 2021-04-21 MED ORDER — NAPROXEN 500 MG PO TABS: 500.0000 mg | ORAL_TABLET | Freq: Two times a day (BID) | ORAL | 0 refills | Status: DC

## 2021-04-21 NOTE — ED Provider Notes (Signed)
[facility]   MRN: [id number] DOB: 1996/04/19  Subjective:   Teresa Harrington is a 25 y.o. female presenting for 1 week history of worsening moderate-severe right lower quadrant/pelvic pain that is mostly constant. Initially, her pain started over all her abdomen but has now become more focal over aforementioned area. Has had associated nausea without vomiting, decreased appetite. Has used APAP for the pain. Patient is sexually active, uses condoms for protection, has 1 female partner. Has Nexplanon.  Denies fever, cough, chest pain, shortness of breath, dysuria, urinary frequency, hematuria, vaginal discharge, genital rash, diarrhea, constipation.  Patient does have a history of chlamydia and bacterial vaginosis, last episode was in October.  She did have an STI check again in February and was negative.  She has low suspicion for this but is not opposed to retesting.  Denies history of fibroids, ovarian cyst, gynecologic problems.  She does have a gynecologist but has not reached out to them.  Patient hydrates very well.  No current facility-administered medications for this encounter. No current outpatient medications on file.   Allergies  Allergen Reactions  . Tramadol Hives  . Other Hives    AGENT: [facility]     Past Medical History:  Diagnosis Date  . Medical history non-contributory      Past Surgical History:  Procedure Laterality Date  . NO PAST SURGERIES      Family History  Problem Relation Age of Onset  . Healthy Mother   . Healthy Father     Social History   Tobacco Use  . Smoking status: Never Smoker  . Smokeless tobacco: Never Used  Vaping Use  . Vaping Use: Never used  Substance Use Topics  . Alcohol use: No  . Drug use: No    ROS   Objective:   Vitals: BP 127/82 (BP Location: Right Arm)   Pulse 66   Temp 98.6 F (37 C) (Oral)   Resp 19   LMP 04/01/2021 (Exact Date)   SpO2 100%   Breastfeeding No   Physical  Exam Constitutional:      General: She is not in acute distress.    Appearance: Normal appearance. She is well-developed and normal weight. She is not ill-appearing, toxic-appearing or diaphoretic.  HENT:     Head: Normocephalic and atraumatic.     Right Ear: External ear normal.     Left Ear: External ear normal.     Nose: Nose normal.     Mouth/Throat:     Mouth: Mucous membranes are moist.     Pharynx: Oropharynx is clear.  Eyes:     General: No scleral icterus.    Extraocular Movements: Extraocular movements intact.     Pupils: Pupils are equal, round, and reactive to light.  Cardiovascular:     Rate and Rhythm: Normal rate and regular rhythm.     Pulses: Normal pulses.     Heart sounds: Normal heart sounds. No murmur heard. No friction rub. No gallop.   Pulmonary:     Effort: Pulmonary effort is normal. No respiratory distress.     Breath sounds: Normal breath sounds. No stridor. No wheezing, rhonchi or rales.  Abdominal:     General: Bowel sounds are normal. There is no distension.     Palpations: Abdomen is soft. There is no mass.     Tenderness: There is abdominal tenderness in the right lower quadrant, suprapubic area and left lower quadrant. There is no right CVA tenderness, left CVA tenderness,  guarding or rebound.  Skin:    General: Skin is warm and dry.     Coloration: Skin is not pale.     Findings: No rash.  Neurological:     General: No focal deficit present.     Mental Status: She is alert and oriented to person, place, and time.  Psychiatric:        Mood and Affect: Mood normal.        Behavior: Behavior normal.        Thought Content: Thought content normal.        Judgment: Judgment normal.     Results for orders placed or performed during the hospital encounter of 04/21/21 (from the past 24 hour(s))  POCT Urinalysis Dipstick (ED/UC)     Status: Abnormal   Collection Time: 04/21/21  9:32 AM  Result Value Ref Range   Glucose, UA NEGATIVE NEGATIVE  mg/dL   Bilirubin Urine NEGATIVE NEGATIVE   Ketones, ur TRACE (A) NEGATIVE mg/dL   Specific Gravity, Urine 1.020 1.005 - 1.030   Hgb urine dipstick TRACE (A) NEGATIVE   pH 7.0 5.0 - 8.0   Protein, ur NEGATIVE NEGATIVE mg/dL   Urobilinogen, UA 0.2 0.0 - 1.0 mg/dL   Nitrite NEGATIVE NEGATIVE   Leukocytes,Ua NEGATIVE NEGATIVE  POC urine preg, ED (not at [facility])     Status: None   Collection Time: 04/21/21  9:36 AM  Result Value Ref Range   Preg Test, Ur NEGATIVE NEGATIVE    Assessment and Plan :   PDMP not reviewed this encounter.  1. Pelvic pain in female   2. Right lower quadrant abdominal pain   3. Nausea without vomiting     Patient has 1 week history of persistent and worsening undifferentiated abdominal pain.  Discussed differential which includes recurrent bacterial vaginosis, STI.  Labs pending, will treat as appropriate based on results.  Also discussed possibility of a gynecologic source of her symptoms including uterine fibroids, ovarian cysts and recommended follow-up for consideration of imaging through pelvic and transvaginal ultrasound with her gynecologist.  In the meantime, use naproxen for pain and inflammation.  Low suspicion for appendicitis, PID given physical exam findings and vital signs. Counseled patient on potential for adverse effects with medications prescribed/recommended today, ER and return-to-clinic precautions discussed, patient verbalized understanding.    Teresa Harrington, [place] 04/21/21 [phone]

## 2021-04-21 NOTE — ED Triage Notes (Signed)
Pt in with c/o RLQ pain that started over 1 week ago. Pt also c/o nausea  Pt took tylenol with no relief

## 2021-04-22 ENCOUNTER — Telehealth (HOSPITAL_COMMUNITY): Payer: Self-pay | Admitting: Internal Medicine

## 2021-04-22 ENCOUNTER — Telehealth (HOSPITAL_COMMUNITY): Payer: Self-pay | Admitting: Emergency Medicine

## 2021-04-22 LAB — CERVICOVAGINAL ANCILLARY ONLY
Bacterial Vaginitis (gardnerella): POSITIVE — AB
Chlamydia: NEGATIVE
Comment: NEGATIVE
Comment: NEGATIVE
Comment: NEGATIVE
Comment: NORMAL
Neisseria Gonorrhea: NEGATIVE
Trichomonas: NEGATIVE

## 2021-04-22 MED ORDER — METRONIDAZOLE 500 MG PO TABS: 500.0000 mg | ORAL_TABLET | Freq: Two times a day (BID) | ORAL | 0 refills | Status: DC

## 2021-04-22 NOTE — Telephone Encounter (Signed)
Needs flagyl to go to [facility]

## 2021-04-26 ENCOUNTER — Telehealth (HOSPITAL_COMMUNITY): Payer: Self-pay | Admitting: *Deleted

## 2021-04-26 ENCOUNTER — Other Ambulatory Visit (HOSPITAL_COMMUNITY)
Admission: RE | Admit: 2021-04-26 | Discharge: 2021-04-26 | Disposition: A | Discharge status: Home / Self Care | Payer: Medicaid Other | Source: Ambulatory Visit | Attending: Advanced Practice Midwife | Admitting: Advanced Practice Midwife

## 2021-04-26 ENCOUNTER — Encounter (HOSPITAL_COMMUNITY): Payer: Self-pay | Admitting: *Deleted

## 2021-04-26 ENCOUNTER — Other Ambulatory Visit: Payer: Self-pay

## 2021-04-26 ENCOUNTER — Encounter: Payer: Self-pay | Admitting: Advanced Practice Midwife

## 2021-04-26 ENCOUNTER — Inpatient Hospital Stay (HOSPITAL_COMMUNITY)
Admission: AD | Admit: 2021-04-26 | Discharge: 2021-04-26 | Disposition: A | Discharge status: Home / Self Care | Payer: Medicaid Other | Attending: Obstetrics and Gynecology | Admitting: Obstetrics and Gynecology

## 2021-04-26 ENCOUNTER — Ambulatory Visit (INDEPENDENT_AMBULATORY_CARE_PROVIDER_SITE_OTHER): Payer: Medicaid Other | Admitting: Advanced Practice Midwife

## 2021-04-26 ENCOUNTER — Encounter (HOSPITAL_COMMUNITY): Payer: Self-pay | Admitting: Obstetrics and Gynecology

## 2021-04-26 VITALS — BP 143/92 | HR 76 | Wt 196.0 lb

## 2021-04-26 DIAGNOSIS — O133 Gestational [pregnancy-induced] hypertension without significant proteinuria, third trimester: Secondary | ICD-10-CM

## 2021-04-26 DIAGNOSIS — Z3A36 36 weeks gestation of pregnancy: Secondary | ICD-10-CM

## 2021-04-26 DIAGNOSIS — Z3480 Encounter for supervision of other normal pregnancy, unspecified trimester: Secondary | ICD-10-CM

## 2021-04-26 DIAGNOSIS — B9689 Other specified bacterial agents as the cause of diseases classified elsewhere: Secondary | ICD-10-CM

## 2021-04-26 DIAGNOSIS — Z87891 Personal history of nicotine dependence: Secondary | ICD-10-CM | POA: Diagnosis not present

## 2021-04-26 DIAGNOSIS — R030 Elevated blood-pressure reading, without diagnosis of hypertension: Secondary | ICD-10-CM | POA: Diagnosis present

## 2021-04-26 DIAGNOSIS — O3413 Maternal care for benign tumor of corpus uteri, third trimester: Secondary | ICD-10-CM | POA: Diagnosis not present

## 2021-04-26 DIAGNOSIS — O26893 Other specified pregnancy related conditions, third trimester: Secondary | ICD-10-CM | POA: Diagnosis not present

## 2021-04-26 DIAGNOSIS — D259 Leiomyoma of uterus, unspecified: Secondary | ICD-10-CM | POA: Insufficient documentation

## 2021-04-26 DIAGNOSIS — R7302 Impaired glucose tolerance (oral): Secondary | ICD-10-CM | POA: Insufficient documentation

## 2021-04-26 LAB — PROTEIN / CREATININE RATIO, URINE
Creatinine, Urine: 47.51 mg/dL
Protein Creatinine Ratio: 0.17 mg/mg{Cre} — ABNORMAL HIGH (ref 0.00–0.15)
Total Protein, Urine: 8 mg/dL

## 2021-04-26 LAB — CBC
HCT: 37.7 % (ref 36.0–46.0)
Hemoglobin: 12.7 g/dL (ref 12.0–15.0)
MCH: 29.5 pg (ref 26.0–34.0)
MCHC: 33.7 g/dL (ref 30.0–36.0)
MCV: 87.5 fL (ref 80.0–100.0)
Platelets: 234 10*3/uL (ref 150–400)
RBC: 4.31 MIL/uL (ref 3.87–5.11)
RDW: 12.2 % (ref 11.5–15.5)
WBC: 6.4 10*3/uL (ref 4.0–10.5)
nRBC: 0 % (ref 0.0–0.2)

## 2021-04-26 LAB — COMPREHENSIVE METABOLIC PANEL
ALT: 12 U/L (ref 0–44)
AST: 16 U/L (ref 15–41)
Albumin: 2.2 g/dL — ABNORMAL LOW (ref 3.5–5.0)
Alkaline Phosphatase: 139 U/L — ABNORMAL HIGH (ref 38–126)
Anion gap: 10 (ref 5–15)
BUN: 5 mg/dL — ABNORMAL LOW (ref 6–20)
CO2: 20 mmol/L — ABNORMAL LOW (ref 22–32)
Calcium: 9.3 mg/dL (ref 8.9–10.3)
Chloride: 103 mmol/L (ref 98–111)
Creatinine, Ser: 0.64 mg/dL (ref 0.44–1.00)
GFR, Estimated: >60 mL/min (ref >60)
Glucose, Bld: 141 mg/dL — ABNORMAL HIGH (ref 70–99)
Potassium: 3.6 mmol/L (ref 3.5–5.1)
Sodium: 133 mmol/L — ABNORMAL LOW (ref 135–145)
Total Bilirubin: 0.3 mg/dL (ref 0.3–1.2)
Total Protein: 6.3 g/dL — ABNORMAL LOW (ref 6.5–8.1)

## 2021-04-26 NOTE — Progress Notes (Signed)
First BP 142/98. Repeat BP 143/92 . Pt denies headache or visual changes. PHQ score 7. GAD score 2

## 2021-04-26 NOTE — Telephone Encounter (Signed)
Preadmission screen  

## 2021-04-26 NOTE — Discharge Instructions (Signed)
Someone will call you to let you know what time to go your OB office for BP recheck  Please return to [facility]U for dizziness, visual disturbances (blurry vision or partial or complete blindness), upper abdominal (in between ribs, but under sternum).

## 2021-04-26 NOTE — Progress Notes (Signed)
   PRENATAL VISIT NOTE  Subjective:  Jessica Hansen is a 25 y.o. G1P0 at [redacted]w[redacted]d being seen today for ongoing prenatal care.  She is currently monitored for the following issues for this low-risk pregnancy and has Supervision of normal first pregnancy, antepartum; Elevated hemoglobin A1c; Bacterial vaginosis in pregnancy; and Uterine fibroid on their problem list.  Patient reports no complaints.  Contractions: Irritability. Vag. Bleeding: None.  Movement: Present. Denies leaking of fluid.   The following portions of the patient's history were reviewed and updated as appropriate: allergies, current medications, past family history, past medical history, past social history, past surgical history and problem list.   Objective:   Vitals:   04/26/21 0834 04/26/21 0835  BP: (!) 142/98 (!) 143/92  Pulse: 77 76  Weight: 196 lb (88.9 kg)     Fetal Status: Fetal Heart Rate (bpm): 145 Fundal Height: 36 cm Movement: Present  Presentation: Vertex  General:  Alert, oriented and cooperative. Patient is in no acute distress.  Skin: Skin is warm and dry. No rash noted.   Cardiovascular: Normal heart rate noted  Respiratory: Normal respiratory effort, no problems with respiration noted  Abdomen: Soft, gravid, appropriate for gestational age.  Pain/Pressure: Absent     Pelvic: Cervical exam performed in the presence of a chaperone Dilation: Closed Effacement (%): 80 Station: -3  Extremities: Normal range of motion.  Edema: Trace  Mental Status: Normal mood and affect. Normal behavior. Normal judgment and thought content.   Assessment and Plan:  Pregnancy: G1P0 at [redacted]w[redacted]d 1. Supervision of other normal pregnancy, antepartum       - Culture, beta strep (group b only) - Cervicovaginal ancillary only( [place])  2. [redacted] weeks gestation of pregnancy    3. Gestational hypertension, third trimester     New Hypertension today. States had one value elevated at home last week.  No headache or visual  changes No edema DTRs 2+ Discussed will likely recommend IOL next week for GHTN.  Will probably need Foley and Pitocin, almost completely effaced already  Preterm labor symptoms and general obstetric precautions including but not limited to vaginal bleeding, contractions, leaking of fluid and fetal movement were reviewed in detail with the patient. Please refer to After Visit Summary for other counseling recommendations.   Return in about 1 week (around 05/03/2021) for [facility].  Future Appointments  Date Time Provider [place]  05/03/2021  8:55 AM Seabron Spates, CNM [facility] None  05/10/2021  8:55 AM Seabron Spates, CNM [facility] None  05/17/2021  8:55 AM Seabron Spates, CNM [facility] None    Hansel Feinstein, CNM

## 2021-04-26 NOTE — [facility]U Note (Signed)
Pt sent from office for BP evaluation.

## 2021-04-26 NOTE — Patient Instructions (Signed)
Hypertension During Pregnancy High blood pressure (hypertension) is when the force of blood pumping through the arteries is high enough to cause problems with your health. Arteries are blood vessels that carry blood from the heart throughout the body. Hypertension during pregnancy can cause problems for you and your baby. It can be mild or severe. There are different types of hypertension that can happen during pregnancy. These include:  Chronic hypertension. This happens when you had high blood pressure before you became pregnant, and it continues during the pregnancy. Hypertension that develops before you are [redacted] weeks pregnant and continues during the pregnancy is also called chronic hypertension. If you have chronic hypertension, it will not go away after you have your baby. You will need follow-up visits with your health care provider after you have your baby. Your health care provider may want you to keep taking medicine for your blood pressure.  Gestational hypertension. This is hypertension that develops after the 20th week of pregnancy. Gestational hypertension usually goes away after you have your baby, but your health care provider will need to monitor your blood pressure to make sure that it is getting better.  Postpartum hypertension. This is high blood pressure that was present before delivery and continues after delivery or that starts after delivery. This usually occurs within 48 hours after childbirth but may occur up to 6 weeks after giving birth. When hypertension during pregnancy is severe, it is a medical emergency that requires treatment right away. How does this affect me? Women who have hypertension during pregnancy have a greater chance of developing hypertension later in life or during future pregnancies. In some cases, hypertension during pregnancy can cause serious complications, such as:  Stroke.  Heart attack.  Injury to other organs, such as kidneys, lungs, or  liver.  Preeclampsia.  A condition called hemolysis, elevated liver enzymes, and low platelet count (HELLP) syndrome.  Convulsions or seizures.  Placental abruption. How does this affect my baby? Hypertension during pregnancy can affect your baby. Your baby may:  Be born early (prematurely).  Not weigh as much as he or she should at birth (low birth weight).  Not tolerate labor well, leading to an unplanned cesarean delivery. This condition may also result in a baby's death before birth (stillbirth). What are the risks? There are certain factors that make it more likely for you to develop hypertension during pregnancy. These include:  Having hypertension during a previous pregnancy or a family history of hypertension.  Being overweight.  Being age 35 or older.  Being pregnant for the first time.  Being pregnant with more than one baby.  Becoming pregnant using fertilization methods, such as IVF (in vitro fertilization).  Having other medical problems, such as diabetes, kidney disease, or lupus. What can I do to lower my risk? The exact cause of hypertension during pregnancy is not known. You may be able to lower your risk by:  Maintaining a healthy weight.  Eating a healthy and balanced diet.  Following your health care provider's instructions about treating any long-term conditions that you had before becoming pregnant. It is very important to keep all of your prenatal care appointments. Your health care provider will check your blood pressure and make sure that your pregnancy is progressing as expected. If a problem is found, early treatment can prevent complications.   How is this treated? Treatment for hypertension during pregnancy varies depending on the type of hypertension you have and how serious it is.  If you were   taking medicine for high blood pressure before you became pregnant, talk with your health care provider. You may need to change medicine during  pregnancy because some medicines, like ACE inhibitors, may not be considered safe for your baby.  If you have gestational hypertension, your health care provider may order medicine to treat this during pregnancy.  If you are at risk for preeclampsia, your health care provider may recommend that you take a low-dose aspirin during your pregnancy.  If you have severe hypertension, you may need to be hospitalized so you and your baby can be monitored closely. You may also need to be given medicine to lower your blood pressure.  In some cases, if your condition gets worse, you may need to deliver your baby early. Follow these instructions at home: Eating and drinking  Drink enough fluid to keep your urine pale yellow.  Avoid caffeine.   Lifestyle  Do not use any products that contain nicotine or tobacco. These products include cigarettes, chewing tobacco, and vaping devices, such as e-cigarettes. If you need help quitting, ask your health care provider.  Do not use alcohol or drugs.  Avoid stress as much as possible.  Rest and get plenty of sleep.  Regular exercise can help to reduce your blood pressure. Ask your health care provider what kinds of exercise are best for you. General instructions  Take over-the-counter and prescription medicines only as told by your health care provider.  Keep all prenatal and follow-up visits. This is important. Contact a health care provider if:  You have symptoms that your health care provider told you may require more treatment or monitoring, such as: ? Headaches. ? Nausea or vomiting. ? Abdominal pain. ? Dizziness. ? Light-headedness. Get help right away if:  You have symptoms of serious complications, such as: ? Severe abdominal pain that does not get better with treatment. ? A severe headache that does not get better, blurred vision, or double vision. ? Vomiting that does not get better. ? Sudden, rapid weight gain or swelling in your  hands, ankles, or face. ? Vaginal bleeding. ? Blood in your urine. ? Shortness of breath or chest pain. ? Weakness on one side of your body or difficulty speaking.  Your baby is not moving as much as usual. These symptoms may represent a serious problem that is an emergency. Do not wait to see if the symptoms will go away. Get medical help right away. Call your local emergency services (911 in the [place] Do not drive yourself to the hospital. Summary  Hypertension during pregnancy can cause problems for you and your baby.  Treatment for hypertension during pregnancy varies depending on the type of hypertension you have and how serious it is.  Keep all prenatal and follow-up visits. This is important.  Get help right away if you have symptoms of serious complications related to high blood pressure. This information is not intended to replace advice given to you by your health care provider. Make sure you discuss any questions you have with your health care provider. Document Revised: 07/29/2020 Document Reviewed: 07/29/2020 Elsevier Patient Education  2021 [employer] Inc.  

## 2021-04-26 NOTE — MAU Provider Note (Signed)
History     CSN: [id number]  Arrival date and time: 04/26/21 0936   Event Date/Time   First Provider Initiated Contact with Patient 04/26/21 1039      Chief Complaint  Patient presents with  . Hypertension   Ms. Jessica Hansen is a 25 y.o. year old G42P0 female at [redacted]w[redacted]d weeks gestation who was sent to [facility]U from [facility] for elevated BPs. Today is the first time she has had elevated BPs. She denies any H/A, dizziness, blurry vision, epigastric pain, or sudden weight gain. Her pregnancy has been complicated with uterine fibroids and elevated HgBA1C. She receives her [facility] with [facility]. She is scheduled for IOL on 05/02/2021.   OB History    Gravida  1   Para      Term      Preterm      AB      Living        SAB      IAB      Ectopic      Multiple      Live Births              Past Medical History:  Diagnosis Date  . Pregnancy induced hypertension     History reviewed. No pertinent surgical history.  Family History  Problem Relation Age of Onset  . Depression Mother     Social History   Tobacco Use  . Smoking status: Former Research scientist (life sciences)  . Smokeless tobacco: Never Used  Vaping Use  . Vaping Use: Never used  Substance Use Topics  . Alcohol use: Yes    Comment: social  . Drug use: Never    Allergies:  Allergies  Allergen Reactions  . Celery Oil Hives    No medications prior to admission.    Review of Systems  Constitutional: Negative.   HENT: Negative.   Eyes: Negative.   Respiratory: Negative.   Cardiovascular: Negative.   Gastrointestinal: Negative.   Endocrine: Negative.   Genitourinary: Negative.   Musculoskeletal: Negative.   Skin: Negative.   Allergic/Immunologic: Negative.   Neurological: Negative.   Hematological: Negative.   Psychiatric/Behavioral: Negative.    Physical Exam   Patient Vitals for the past 24 hrs:  BP Temp Temp src Pulse Resp SpO2  04/26/21 1319 -- -- -- -- -- 100 %  04/26/21 1316 (!) 123/53 -- -- 79 -- --   04/26/21 1315 -- -- -- -- -- 100 %  04/26/21 1310 -- -- -- -- -- 99 %  04/26/21 1305 -- -- -- -- -- 100 %  04/26/21 1300 (!) 141/81 -- -- 84 -- 100 %  04/26/21 1255 -- -- -- -- -- 100 %  04/26/21 1250 -- -- -- -- -- 100 %  04/26/21 1245 135/88 -- -- 97 -- 100 %  04/26/21 1240 -- -- -- -- -- 100 %  04/26/21 1235 -- -- -- -- -- 100 %  04/26/21 1230 137/81 -- -- 82 -- 100 %  04/26/21 1225 -- -- -- -- -- 100 %  04/26/21 1220 -- -- -- -- -- 100 %  04/26/21 1215 138/81 -- -- 88 -- 100 %  04/26/21 1210 -- -- -- -- -- 99 %  04/26/21 1205 -- -- -- -- -- 100 %  04/26/21 1200 136/76 -- -- 79 -- 100 %  04/26/21 1155 -- -- -- -- -- 100 %  04/26/21 1150 -- -- -- -- -- 100 %  04/26/21 1145 132/79 -- -- 84 -- 100 %  04/26/21 1140 -- -- -- -- -- 100 %  04/26/21 1135 -- -- -- -- -- 100 %  04/26/21 1130 137/74 -- -- 91 -- 100 %  04/26/21 1125 -- -- -- -- -- 100 %  04/26/21 1120 -- -- -- -- -- 100 %  04/26/21 1115 (!) 144/83 -- -- 89 -- 100 %  04/26/21 1110 -- -- -- -- -- 100 %  04/26/21 1105 -- -- -- -- -- 100 %  04/26/21 1100 (!) 151/87 -- -- 93 -- 100 %  04/26/21 1055 -- -- -- -- -- 100 %  04/26/21 1050 -- -- -- -- -- 99 %  04/26/21 1045 (!) 149/89 -- -- (!) 106 -- 99 %  04/26/21 1040 -- -- -- -- -- 99 %  04/26/21 1035 -- -- -- -- -- 100 %  04/26/21 1030 (!) 146/92 -- -- (!) 105 -- 100 %  04/26/21 1025 -- -- -- -- -- 100 %  04/26/21 1020 -- -- -- -- -- 100 %  04/26/21 1015 (!) 149/85 -- -- 97 -- 99 %  04/26/21 1010 -- -- -- -- -- 99 %  04/26/21 1005 -- -- -- -- -- 100 %  04/26/21 1000 (!) 170/89 97.8 F (36.6 C) Oral 86 17 99 %    Physical Exam  REACTIVE NST - FHR: 145 bpm / moderate variability / accels present / decels absent / TOCO: UI noted  MAU Course  Procedures  MDM CCUA CBC CMP P/C Ratio Serial BP's   Results for orders placed or performed during the hospital encounter of 04/26/21 (from the past 48 hour(s))  Protein / creatinine ratio, urine     Status: Abnormal    Collection Time: 04/26/21  9:49 AM  Result Value Ref Range   Creatinine, Urine 47.51 mg/dL   Total Protein, Urine 8 mg/dL    Comment: NO NORMAL RANGE ESTABLISHED FOR THIS TEST   Protein Creatinine Ratio 0.17 (H) 0.00 - 0.15 mg/mg[Cre]    Comment: Performed at [facility], [address], [place], [place] [address]  CBC     Status: None   Collection Time: 04/26/21 10:04 AM  Result Value Ref Range   WBC 6.4 4.0 - 10.5 K/uL   RBC 4.31 3.87 - 5.11 MIL/uL   Hemoglobin 12.7 12.0 - 15.0 g/dL   HCT 37.7 36.0 - 46.0 %   MCV 87.5 80.0 - 100.0 fL   MCH 29.5 26.0 - 34.0 pg   MCHC 33.7 30.0 - 36.0 g/dL   RDW 12.2 11.5 - 15.5 %   Platelets 234 150 - 400 K/uL   nRBC 0.0 0.0 - 0.2 %    Comment: Performed at [facility], [place] [address] [place], [place] [address]  Comprehensive metabolic panel     Status: Abnormal   Collection Time: 04/26/21 10:04 AM  Result Value Ref Range   Sodium 133 (L) 135 - 145 mmol/L   Potassium 3.6 3.5 - 5.1 mmol/L   Chloride 103 98 - 111 mmol/L   CO2 20 (L) 22 - 32 mmol/L   Glucose, Bld 141 (H) 70 - 99 mg/dL    Comment: Glucose reference range applies only to samples taken after fasting for at least 8 hours.   BUN 5 (L) 6 - 20 mg/dL   Creatinine, Ser 0.64 0.44 - 1.00 mg/dL   Calcium 9.3 8.9 - 10.3 mg/dL   Total Protein 6.3 (L) 6.5 - 8.1 g/dL   Albumin 2.2 (L) 3.5 -  5.0 g/dL   AST 16 15 - 41 U/L   ALT 12 0 - 44 U/L   Alkaline Phosphatase 139 (H) 38 - 126 U/L   Total Bilirubin 0.3 0.3 - 1.2 mg/dL   GFR, Estimated >60 >60 mL/min    Comment: (NOTE) Calculated using the CKD-EPI Creatinine Equation (2021)    Anion gap 10 5 - 15    Comment: Performed at [facility] [address], [place], [place] [address]    *Consult with Dr. Elly Modena @ [phone] - notified of patient's complaints, assessments, lab & NST results, tx plan d/c home with BP check in the office tomorrow and keep IOL appt on 6/13 - ok to d/c home, agrees with plan   Assessment and Plan   Gestational hypertension without significant proteinuria during pregnancy in third trimester, antepartum - Information provided on gHTN, PEC s/sx's - Advised to return to MAU for dizziness, visual disturbances (blurry vision or partial or complete blindness), upper abdominal (in between ribs, but under sternum). - BP check in office on 04/27/21 -- msg sent to [facility]  [redacted] weeks gestation of pregnancy   - Discharge home - Someone will call to give time of your appt tomorrow - Patient verbalized an understanding of the plan of care and agrees.   Laury Deep, CNM 04/26/2021, 10:40 AM

## 2021-04-27 ENCOUNTER — Ambulatory Visit (INDEPENDENT_AMBULATORY_CARE_PROVIDER_SITE_OTHER): Payer: Medicaid Other

## 2021-04-27 ENCOUNTER — Ambulatory Visit: Payer: Medicaid Other

## 2021-04-27 ENCOUNTER — Other Ambulatory Visit: Payer: Self-pay | Admitting: Advanced Practice Midwife

## 2021-04-27 ENCOUNTER — Telehealth: Payer: Self-pay

## 2021-04-27 DIAGNOSIS — O23599 Infection of other part of genital tract in pregnancy, unspecified trimester: Secondary | ICD-10-CM

## 2021-04-27 DIAGNOSIS — Z3A36 36 weeks gestation of pregnancy: Secondary | ICD-10-CM

## 2021-04-27 DIAGNOSIS — O134 Gestational [pregnancy-induced] hypertension without significant proteinuria, complicating childbirth: Secondary | ICD-10-CM

## 2021-04-27 LAB — CERVICOVAGINAL ANCILLARY ONLY
Chlamydia: NEGATIVE
Comment: NEGATIVE
Comment: NORMAL
Neisseria Gonorrhea: NEGATIVE

## 2021-04-27 NOTE — Progress Notes (Signed)
Pt here for BP check. BP 131/84. Pt is scheduled for IOL 05/02/21.  Pt has no complaints. Pt denies headache, visual changes, increased swelling or any upper abdominal pain. Pt endorses fetal movement. Pt is aware to go to MAU if blood pressure increases and/or she develops any of the symptoms stated above.

## 2021-04-27 NOTE — Telephone Encounter (Signed)
Pt called with BP of 146/93. Returned pt call. Pt denies headache, visual changes, upper abdominal pain. Spoke with Dr.Stinson and he recommends pt come for BP check this afternoon. Pt agrees to come to appt today. I told pt if anything changes between then and now and her BP increases and/or she develops any of the symptoms stated above then she needs to go to MAU. Pt expressed understanding.

## 2021-04-28 ENCOUNTER — Inpatient Hospital Stay (HOSPITAL_COMMUNITY)
Admission: AD | Admit: 2021-04-28 | Discharge: 2021-04-28 | Disposition: A | Discharge status: Home / Self Care | Payer: Medicaid Other | Attending: Obstetrics and Gynecology | Admitting: Obstetrics and Gynecology

## 2021-04-28 ENCOUNTER — Other Ambulatory Visit: Payer: Self-pay | Admitting: Advanced Practice Midwife

## 2021-04-28 ENCOUNTER — Encounter (HOSPITAL_COMMUNITY): Payer: Self-pay | Admitting: Obstetrics and Gynecology

## 2021-04-28 DIAGNOSIS — R110 Nausea: Secondary | ICD-10-CM | POA: Diagnosis not present

## 2021-04-28 DIAGNOSIS — Z87891 Personal history of nicotine dependence: Secondary | ICD-10-CM | POA: Insufficient documentation

## 2021-04-28 DIAGNOSIS — M25472 Effusion, left ankle: Secondary | ICD-10-CM | POA: Insufficient documentation

## 2021-04-28 DIAGNOSIS — Z3A36 36 weeks gestation of pregnancy: Secondary | ICD-10-CM | POA: Diagnosis not present

## 2021-04-28 DIAGNOSIS — O133 Gestational [pregnancy-induced] hypertension without significant proteinuria, third trimester: Secondary | ICD-10-CM

## 2021-04-28 DIAGNOSIS — O23599 Infection of other part of genital tract in pregnancy, unspecified trimester: Secondary | ICD-10-CM

## 2021-04-28 DIAGNOSIS — M25471 Effusion, right ankle: Secondary | ICD-10-CM | POA: Diagnosis not present

## 2021-04-28 DIAGNOSIS — O139 Gestational [pregnancy-induced] hypertension without significant proteinuria, unspecified trimester: Secondary | ICD-10-CM | POA: Diagnosis present

## 2021-04-28 DIAGNOSIS — O26893 Other specified pregnancy related conditions, third trimester: Secondary | ICD-10-CM | POA: Insufficient documentation

## 2021-04-28 LAB — COMPREHENSIVE METABOLIC PANEL
ALT: 12 U/L (ref 0–44)
AST: 16 U/L (ref 15–41)
Albumin: 2.5 g/dL — ABNORMAL LOW (ref 3.5–5.0)
Alkaline Phosphatase: 146 U/L — ABNORMAL HIGH (ref 38–126)
Anion gap: 10 (ref 5–15)
BUN: 6 mg/dL (ref 6–20)
CO2: 20 mmol/L — ABNORMAL LOW (ref 22–32)
Calcium: 8.9 mg/dL (ref 8.9–10.3)
Chloride: 105 mmol/L (ref 98–111)
Creatinine, Ser: 0.59 mg/dL (ref 0.44–1.00)
GFR, Estimated: >60 mL/min (ref >60)
Glucose, Bld: 82 mg/dL (ref 70–99)
Potassium: 3.6 mmol/L (ref 3.5–5.1)
Sodium: 135 mmol/L (ref 135–145)
Total Bilirubin: 0.4 mg/dL (ref 0.3–1.2)
Total Protein: 6.6 g/dL (ref 6.5–8.1)

## 2021-04-28 LAB — PROTEIN / CREATININE RATIO, URINE
Creatinine, Urine: 122.22 mg/dL
Protein Creatinine Ratio: 0.13 mg/mg{Cre} (ref 0.00–0.15)
Total Protein, Urine: 16 mg/dL

## 2021-04-28 LAB — URINALYSIS, ROUTINE W REFLEX MICROSCOPIC
Bilirubin Urine: NEGATIVE
Glucose, UA: NEGATIVE mg/dL
Hgb urine dipstick: NEGATIVE
Ketones, ur: NEGATIVE mg/dL
Leukocytes,Ua: NEGATIVE
Nitrite: NEGATIVE
Protein, ur: NEGATIVE mg/dL
Specific Gravity, Urine: 1.016 (ref 1.005–1.030)
pH: 7 (ref 5.0–8.0)

## 2021-04-28 LAB — CBC
HCT: 38.3 % (ref 36.0–46.0)
Hemoglobin: 13.2 g/dL (ref 12.0–15.0)
MCH: 30.1 pg (ref 26.0–34.0)
MCHC: 34.5 g/dL (ref 30.0–36.0)
MCV: 87.4 fL (ref 80.0–100.0)
Platelets: 238 10*3/uL (ref 150–400)
RBC: 4.38 MIL/uL (ref 3.87–5.11)
RDW: 12.3 % (ref 11.5–15.5)
WBC: 7.7 10*3/uL (ref 4.0–10.5)
nRBC: 0 % (ref 0.0–0.2)

## 2021-04-28 NOTE — MAU Note (Signed)
BP was elevated when checked at home.  Feeling kind of nauseous. Denies headache, visual changes, epigastric pain or increase in swelling.  Was here a couple days ago for BP elevation. Denies bleeding or leaking.Marland Kitchen +FM

## 2021-04-28 NOTE — MAU Provider Note (Signed)
History     [id number]  Arrival date and time: 04/28/21 1656    Chief Complaint  Patient presents with   Hypertension   Nausea    HPI Jessica Hansen is a 25 y.o. G1P0 at [redacted]w[redacted]d by LMP  who presents for elevated home BP. Around 4:30 PM, she felt nauseous which has not happened during her pregnancy, so she elected to check her BP, which was 152/86. She also noted bilateral ankle swelling, but no pain or warmth.   Review of discharge summary from last admission on 6/7:  Patient sent to MAU from [facility] for elevated BP, preE labs negative    Review of records from Care Everywhere: 03/10/21 SOB, normal CXR  O/Positive/-- (11/16 1119)  OB History     Gravida  1   Para      Term      Preterm      AB      Living         SAB      IAB      Ectopic      Multiple      Live Births              Past Medical History:  Diagnosis Date   Pregnancy induced hypertension     History reviewed. No pertinent surgical history.  Family History  Problem Relation Age of Onset   Depression Mother    Healthy Father     Social History   Socioeconomic History   Marital status: Single    Spouse name: Not on file   Number of children: Not on file   Years of education: Not on file   Highest education level: Not on file  Occupational History   Not on file  Tobacco Use   Smoking status: Former    Pack years: 0.00   Smokeless tobacco: Never  Vaping Use   Vaping Use: Never used  Substance and Sexual Activity   Alcohol use: Not Currently    Comment: social   Drug use: Never   Sexual activity: Not Currently    Birth control/protection: None  Other Topics Concern   Not on file  Social History Narrative   Not on file   Social Determinants of Health   Financial Resource Strain: Not on file  Food Insecurity: Not on file  Transportation Needs: Not on file  Physical Activity: Not on file  Stress: Not on file  Social Connections: Not on file  Intimate Partner  Violence: Not on file    Allergies  Allergen Reactions   Celery Oil Hives    No current facility-administered medications on file prior to encounter.   Current Outpatient Medications on File Prior to Encounter  Medication Sig Dispense Refill   butalbital-acetaminophen-caffeine (FIORICET) 50-325-40 MG tablet Take 1-2 tablets by mouth every 6 (six) hours as needed for headache. 10 tablet 0   cyclobenzaprine (FLEXERIL) 10 MG tablet Take 1 tablet (10 mg total) by mouth 2 (two) times daily as needed for muscle spasms. 20 tablet 0   famotidine (PEPCID) 20 MG tablet Take 1 tablet (20 mg total) by mouth 2 (two) times daily. 60 tablet 3   Prenatal Vit-Fe Fumarate-FA (PRENATAL VITAMINS PO) Take by mouth.     acetaminophen (TYLENOL) 500 MG tablet Take 500 mg by mouth every 6 (six) hours as needed for headache.      Review of Systems  Constitutional:  Negative for chills and fever.  HENT:  Negative for sore throat.  Eyes:  Negative for blurred vision and double vision.  Respiratory:  Negative for cough.   Cardiovascular:  Positive for leg swelling. Negative for chest pain and palpitations.  Gastrointestinal:  Positive for nausea. Negative for abdominal pain, constipation and diarrhea.  Genitourinary:  Negative for dysuria, frequency and urgency.  Musculoskeletal:  Negative for myalgias.  Neurological:  Negative for dizziness and headaches.  Endo/Heme/Allergies:  Does not bruise/bleed easily.    Physical Exam   BP 122/74   Pulse 84   Temp 98.2 F (36.8 C) (Oral)   Resp 17   Ht 5\' 3"  (1.6 m)   Wt 90 kg   LMP 08/16/2020   SpO2 99%   BMI 35.16 kg/m  Vitals:   04/28/21 1831 04/28/21 1845 04/28/21 1846 04/28/21 1901  BP: 124/80 128/80 128/80 122/74  Pulse: 85 89 89 84    Physical Exam Vitals reviewed.  HENT:     Head: Normocephalic and atraumatic.     Nose: Nose normal.  Eyes:     Conjunctiva/sclera: Conjunctivae normal.  Cardiovascular:     Rate and Rhythm: Normal rate.      Pulses: Normal pulses.  Pulmonary:     Effort: Pulmonary effort is normal.  Abdominal:     General: Abdomen is flat.     Palpations: Abdomen is soft.     Tenderness: There is no abdominal tenderness.  Musculoskeletal:     Cervical back: Normal range of motion.     Right ankle: Swelling present. No tenderness. Normal pulse.     Left ankle: Swelling present. No tenderness. Normal pulse.  Skin:    General: Skin is warm and dry.     Capillary Refill: Capillary refill takes less than 2 seconds.  Neurological:     General: No focal deficit present.     Mental Status: She is alert and oriented to person, place, and time.    Cervical Exam  Deferred   FHT Baseline 140, moderate variability, +accels, no decels Toco: irregular  Cat: 1  Labs Results for orders placed or performed during the hospital encounter of 04/28/21 (from the past 24 hour(s))  Urinalysis, Routine w reflex microscopic Urine, Clean Catch     Status: Abnormal   Collection Time: 04/28/21  5:16 PM  Result Value Ref Range   Color, Urine YELLOW YELLOW   APPearance HAZY (A) CLEAR   Specific Gravity, Urine 1.016 1.005 - 1.030   pH 7.0 5.0 - 8.0   Glucose, UA NEGATIVE NEGATIVE mg/dL   Hgb urine dipstick NEGATIVE NEGATIVE   Bilirubin Urine NEGATIVE NEGATIVE   Ketones, ur NEGATIVE NEGATIVE mg/dL   Protein, ur NEGATIVE NEGATIVE mg/dL   Nitrite NEGATIVE NEGATIVE   Leukocytes,Ua NEGATIVE NEGATIVE  Protein / creatinine ratio, urine     Status: None   Collection Time: 04/28/21  5:45 PM  Result Value Ref Range   Creatinine, Urine 122.22 mg/dL   Total Protein, Urine 16 mg/dL   Protein Creatinine Ratio 0.13 0.00 - 0.15 mg/mg[Cre]  CBC     Status: None   Collection Time: 04/28/21  6:36 PM  Result Value Ref Range   WBC 7.7 4.0 - 10.5 K/uL   RBC 4.38 3.87 - 5.11 MIL/uL   Hemoglobin 13.2 12.0 - 15.0 g/dL   HCT 38.3 36.0 - 46.0 %   MCV 87.4 80.0 - 100.0 fL   MCH 30.1 26.0 - 34.0 pg   MCHC 34.5 30.0 - 36.0 g/dL   RDW 12.3  11.5 - 15.5 %  Platelets 238 150 - 400 K/uL   nRBC 0.0 0.0 - 0.2 %  Comprehensive metabolic panel     Status: Abnormal   Collection Time: 04/28/21  6:36 PM  Result Value Ref Range   Sodium 135 135 - 145 mmol/L   Potassium 3.6 3.5 - 5.1 mmol/L   Chloride 105 98 - 111 mmol/L   CO2 20 (L) 22 - 32 mmol/L   Glucose, Bld 82 70 - 99 mg/dL   BUN 6 6 - 20 mg/dL   Creatinine, Ser 0.59 0.44 - 1.00 mg/dL   Calcium 8.9 8.9 - 10.3 mg/dL   Total Protein 6.6 6.5 - 8.1 g/dL   Albumin 2.5 (L) 3.5 - 5.0 g/dL   AST 16 15 - 41 U/L   ALT 12 0 - 44 U/L   Alkaline Phosphatase 146 (H) 38 - 126 U/L   Total Bilirubin 0.4 0.3 - 1.2 mg/dL   GFR, Estimated >60 >60 mL/min   Anion gap 10 5 - 15    MAU Course  Procedures Lab Orders  Urinalysis, Routine w reflex microscopic Urine, Clean Catch  CBC  Comprehensive metabolic panel  Protein / creatinine ratio, urine   Medical Decision Making  25 yo G1P0 at [redacted]w[redacted]d presenting with elevated home BP CBC, CMP, UP/C to assess for preE  BP checks q15 min   Assessment and Plan   Gestational Hypertension  BP here trending downward. Negative preE labs. Given strict return precautions for symptoms including headache, dizziness, vision change, and RUQ pain.   #FWB FHT Cat 1 NST: reactive   IOL scheduled on 6/13   Patient discharged home in stable condition.   [place]

## 2021-04-29 ENCOUNTER — Other Ambulatory Visit (HOSPITAL_COMMUNITY)
Admission: RE | Admit: 2021-04-29 | Discharge: 2021-04-29 | Disposition: A | Discharge status: Home / Self Care | Payer: Medicaid Other | Source: Ambulatory Visit | Attending: Obstetrics & Gynecology | Admitting: Obstetrics & Gynecology

## 2021-04-29 ENCOUNTER — Encounter: Payer: Self-pay | Admitting: Advanced Practice Midwife

## 2021-04-29 ENCOUNTER — Other Ambulatory Visit: Payer: Self-pay | Admitting: Advanced Practice Midwife

## 2021-04-29 DIAGNOSIS — Z20822 Contact with and (suspected) exposure to covid-19: Secondary | ICD-10-CM | POA: Diagnosis not present

## 2021-04-29 DIAGNOSIS — B951 Streptococcus, group B, as the cause of diseases classified elsewhere: Secondary | ICD-10-CM | POA: Insufficient documentation

## 2021-04-29 DIAGNOSIS — Z01812 Encounter for preprocedural laboratory examination: Secondary | ICD-10-CM | POA: Diagnosis present

## 2021-04-29 LAB — CULTURE, BETA STREP (GROUP B ONLY): Strep Gp B Culture: POSITIVE — AB

## 2021-04-29 LAB — SARS CORONAVIRUS 2 (TAT 6-24 HRS): SARS Coronavirus 2: NEGATIVE

## 2021-05-01 ENCOUNTER — Encounter (HOSPITAL_COMMUNITY): Payer: Self-pay | Admitting: Obstetrics and Gynecology

## 2021-05-01 ENCOUNTER — Inpatient Hospital Stay (HOSPITAL_BASED_OUTPATIENT_CLINIC_OR_DEPARTMENT_OTHER): Payer: Medicaid Other

## 2021-05-01 ENCOUNTER — Other Ambulatory Visit: Payer: Self-pay | Admitting: Advanced Practice Midwife

## 2021-05-01 ENCOUNTER — Other Ambulatory Visit: Payer: Self-pay

## 2021-05-01 ENCOUNTER — Inpatient Hospital Stay (EMERGENCY_DEPARTMENT_HOSPITAL)
Admission: AD | Admit: 2021-05-01 | Discharge: 2021-05-01 | Disposition: A | Discharge status: Home / Self Care | Payer: Medicaid Other | Source: Home / Self Care | Attending: Obstetrics and Gynecology | Admitting: Obstetrics and Gynecology

## 2021-05-01 DIAGNOSIS — D259 Leiomyoma of uterus, unspecified: Secondary | ICD-10-CM | POA: Diagnosis not present

## 2021-05-01 DIAGNOSIS — O289 Unspecified abnormal findings on antenatal screening of mother: Secondary | ICD-10-CM | POA: Diagnosis not present

## 2021-05-01 DIAGNOSIS — O133 Gestational [pregnancy-induced] hypertension without significant proteinuria, third trimester: Secondary | ICD-10-CM

## 2021-05-01 DIAGNOSIS — O3413 Maternal care for benign tumor of corpus uteri, third trimester: Secondary | ICD-10-CM | POA: Diagnosis not present

## 2021-05-01 DIAGNOSIS — Z363 Encounter for antenatal screening for malformations: Secondary | ICD-10-CM

## 2021-05-01 DIAGNOSIS — Z3A36 36 weeks gestation of pregnancy: Secondary | ICD-10-CM

## 2021-05-01 DIAGNOSIS — E669 Obesity, unspecified: Secondary | ICD-10-CM

## 2021-05-01 DIAGNOSIS — O99213 Obesity complicating pregnancy, third trimester: Secondary | ICD-10-CM

## 2021-05-01 LAB — COMPREHENSIVE METABOLIC PANEL
ALT: 13 U/L (ref 0–44)
AST: 16 U/L (ref 15–41)
Albumin: 2.4 g/dL — ABNORMAL LOW (ref 3.5–5.0)
Alkaline Phosphatase: 133 U/L — ABNORMAL HIGH (ref 38–126)
Anion gap: 8 (ref 5–15)
BUN: 5 mg/dL — ABNORMAL LOW (ref 6–20)
CO2: 18 mmol/L — ABNORMAL LOW (ref 22–32)
Calcium: 8.6 mg/dL — ABNORMAL LOW (ref 8.9–10.3)
Chloride: 108 mmol/L (ref 98–111)
Creatinine, Ser: 0.54 mg/dL (ref 0.44–1.00)
GFR, Estimated: >60 mL/min (ref >60)
Glucose, Bld: 91 mg/dL (ref 70–99)
Potassium: 4 mmol/L (ref 3.5–5.1)
Sodium: 134 mmol/L — ABNORMAL LOW (ref 135–145)
Total Bilirubin: 0.3 mg/dL (ref 0.3–1.2)
Total Protein: 6.3 g/dL — ABNORMAL LOW (ref 6.5–8.1)

## 2021-05-01 LAB — URINALYSIS, ROUTINE W REFLEX MICROSCOPIC
Bilirubin Urine: NEGATIVE
Glucose, UA: NEGATIVE mg/dL
Hgb urine dipstick: NEGATIVE
Ketones, ur: NEGATIVE mg/dL
Nitrite: NEGATIVE
Protein, ur: NEGATIVE mg/dL
Specific Gravity, Urine: 1.006 (ref 1.005–1.030)
pH: 7 (ref 5.0–8.0)

## 2021-05-01 LAB — CBC WITH DIFFERENTIAL/PLATELET
Abs Immature Granulocytes: 0.04 10*3/uL (ref 0.00–0.07)
Basophils Absolute: 0 10*3/uL (ref 0.0–0.1)
Basophils Relative: 0 %
Eosinophils Absolute: 0.1 10*3/uL (ref 0.0–0.5)
Eosinophils Relative: 2 %
HCT: 37.7 % (ref 36.0–46.0)
Hemoglobin: 12.9 g/dL (ref 12.0–15.0)
Immature Granulocytes: 1 %
Lymphocytes Relative: 28 %
Lymphs Abs: 2 10*3/uL (ref 0.7–4.0)
MCH: 30.1 pg (ref 26.0–34.0)
MCHC: 34.2 g/dL (ref 30.0–36.0)
MCV: 87.9 fL (ref 80.0–100.0)
Monocytes Absolute: 0.6 10*3/uL (ref 0.1–1.0)
Monocytes Relative: 9 %
Neutro Abs: 4.3 10*3/uL (ref 1.7–7.7)
Neutrophils Relative %: 60 %
Platelets: 231 10*3/uL (ref 150–400)
RBC: 4.29 MIL/uL (ref 3.87–5.11)
RDW: 12.6 % (ref 11.5–15.5)
WBC: 7.2 10*3/uL (ref 4.0–10.5)
nRBC: 0 % (ref 0.0–0.2)

## 2021-05-01 LAB — PROTEIN / CREATININE RATIO, URINE
Creatinine, Urine: 38.95 mg/dL
Protein Creatinine Ratio: 0.18 mg/mg{Cre} — ABNORMAL HIGH (ref 0.00–0.15)
Total Protein, Urine: 7 mg/dL

## 2021-05-01 MED ORDER — LACTATED RINGERS IV BOLUS
1000.0000 mL | Freq: Once | INTRAVENOUS | Status: AC
  Administered 2021-05-01: 1000 mL via INTRAVENOUS

## 2021-05-01 NOTE — MAU Note (Signed)
Pt reports to mau with c/o elevated bp at home of 134/96.  Pt denies headache or visual changes. Denies CTX or LOF.  +FM  BP 135/89 in triage.

## 2021-05-01 NOTE — MAU Provider Note (Signed)
History     CSN: [id number]  Arrival date and time: 05/01/21 0947   Event Date/Time   First Provider Initiated Contact with Patient 05/01/21 1014      Chief Complaint  Patient presents with   Hypertension   Allyn Bartelson is a 25 y.o. G1P0 at [redacted]w[redacted]d who presents today with elevated blood pressure. Patient has been diagnosed with GHTN and was scheduled for IOL at 37 weeks on 05/02/2021, but she was notified that it was moved to 05/05/2021. Patient reports that today around 0121am B/P was 140/90 and at 0826am BP was 134/96. She denies any HA, visual disturbance or RUQ pain. She reports normal fetal movement.   Hypertension This is a new problem. The current episode started 1 to 4 weeks ago. The problem is unchanged. Associated agents: pregnancy.   OB History     Gravida  1   Para      Term      Preterm      AB      Living         SAB      IAB      Ectopic      Multiple      Live Births              Past Medical History:  Diagnosis Date   Pregnancy induced hypertension     History reviewed. No pertinent surgical history.  Family History  Problem Relation Age of Onset   Depression Mother    Healthy Father     Social History   Tobacco Use   Smoking status: Former    Pack years: 0.00   Smokeless tobacco: Never  Vaping Use   Vaping Use: Never used  Substance Use Topics   Alcohol use: Not Currently    Comment: social   Drug use: Never    Allergies:  Allergies  Allergen Reactions   Celery Oil Hives    Medications Prior to Admission  Medication Sig Dispense Refill Last Dose   Prenatal Vit-Fe Fumarate-FA (PRENATAL VITAMINS PO) Take by mouth.   05/01/2021   acetaminophen (TYLENOL) 500 MG tablet Take 500 mg by mouth every 6 (six) hours as needed for headache.      butalbital-acetaminophen-caffeine (FIORICET) 50-325-40 MG tablet Take 1-2 tablets by mouth every 6 (six) hours as needed for headache. 10 tablet 0    cyclobenzaprine (FLEXERIL) 10 MG  tablet Take 1 tablet (10 mg total) by mouth 2 (two) times daily as needed for muscle spasms. 20 tablet 0    famotidine (PEPCID) 20 MG tablet Take 1 tablet (20 mg total) by mouth 2 (two) times daily. 60 tablet 3     Review of Systems  All other systems reviewed and are negative. Physical Exam   Blood pressure 133/86, pulse 95, temperature 97.9 F (36.6 C), temperature source Oral, resp. rate 17, last menstrual period 08/16/2020, SpO2 98 %.  Physical Exam Vitals and nursing note reviewed. Exam conducted with a chaperone present.  Constitutional:      General: She is not in acute distress.    Appearance: She is well-developed.  HENT:     Head: Normocephalic.  Eyes:     Pupils: Pupils are equal, round, and reactive to light.  Cardiovascular:     Rate and Rhythm: Normal rate and regular rhythm.     Heart sounds: Normal heart sounds.  Pulmonary:     Effort: Pulmonary effort is normal. No respiratory distress.     Breath  sounds: Normal breath sounds.  Abdominal:     Palpations: Abdomen is soft.     Tenderness: There is no abdominal tenderness.  Genitourinary:    Vagina: No bleeding. Vaginal discharge: mucusy.    Comments: External: no lesion Dilation: Fingertip Effacement (%): 50 Station: -3 Exam by:: Marcille Buffy, CNM   Musculoskeletal:        General: Normal range of motion.     Cervical back: Normal range of motion and neck supple.  Skin:    General: Skin is warm and dry.  Neurological:     Mental Status: She is alert and oriented to person, place, and time.  Psychiatric:        Mood and Affect: Mood normal.        Behavior: Behavior normal.   NST:  Baseline: 140 Variability: moderate, to occasionally minimal  Accels: 10x10, some 15x15 Decels: none Toco: irregular  Borderline, will get BPP to further assess fetal status.   BPP: 8/8   Results for orders placed or performed during the hospital encounter of 05/01/21 (from the past 24 hour(s))  Urinalysis, Routine  w reflex microscopic Urine, Clean Catch     Status: Abnormal   Collection Time: 05/01/21  9:51 AM  Result Value Ref Range   Color, Urine STRAW (A) YELLOW   APPearance CLEAR CLEAR   Specific Gravity, Urine 1.006 1.005 - 1.030   pH 7.0 5.0 - 8.0   Glucose, UA NEGATIVE NEGATIVE mg/dL   Hgb urine dipstick NEGATIVE NEGATIVE   Bilirubin Urine NEGATIVE NEGATIVE   Ketones, ur NEGATIVE NEGATIVE mg/dL   Protein, ur NEGATIVE NEGATIVE mg/dL   Nitrite NEGATIVE NEGATIVE   Leukocytes,Ua TRACE (A) NEGATIVE   RBC / HPF 0-5 0 - 5 RBC/hpf   WBC, UA 0-5 0 - 5 WBC/hpf   Bacteria, UA RARE (A) NONE SEEN   Squamous Epithelial / LPF 0-5 0 - 5  Protein / creatinine ratio, urine     Status: Abnormal   Collection Time: 05/01/21 10:13 AM  Result Value Ref Range   Creatinine, Urine 38.95 mg/dL   Total Protein, Urine 7 mg/dL   Protein Creatinine Ratio 0.18 (H) 0.00 - 0.15 mg/mg[Cre]  CBC with Differential/Platelet     Status: None   Collection Time: 05/01/21 10:30 AM  Result Value Ref Range   WBC 7.2 4.0 - 10.5 K/uL   RBC 4.29 3.87 - 5.11 MIL/uL   Hemoglobin 12.9 12.0 - 15.0 g/dL   HCT 37.7 36.0 - 46.0 %   MCV 87.9 80.0 - 100.0 fL   MCH 30.1 26.0 - 34.0 pg   MCHC 34.2 30.0 - 36.0 g/dL   RDW 12.6 11.5 - 15.5 %   Platelets 231 150 - 400 K/uL   nRBC 0.0 0.0 - 0.2 %   Neutrophils Relative % 60 %   Neutro Abs 4.3 1.7 - 7.7 K/uL   Lymphocytes Relative 28 %   Lymphs Abs 2.0 0.7 - 4.0 K/uL   Monocytes Relative 9 %   Monocytes Absolute 0.6 0.1 - 1.0 K/uL   Eosinophils Relative 2 %   Eosinophils Absolute 0.1 0.0 - 0.5 K/uL   Basophils Relative 0 %   Basophils Absolute 0.0 0.0 - 0.1 K/uL   Immature Granulocytes 1 %   Abs Immature Granulocytes 0.04 0.00 - 0.07 K/uL  Comprehensive metabolic panel     Status: Abnormal   Collection Time: 05/01/21 10:30 AM  Result Value Ref Range   Sodium 134 (L) 135 - 145 mmol/L  Potassium 4.0 3.5 - 5.1 mmol/L   Chloride 108 98 - 111 mmol/L   CO2 18 (L) 22 - 32 mmol/L    Glucose, Bld 91 70 - 99 mg/dL   BUN 5 (L) 6 - 20 mg/dL   Creatinine, Ser 0.54 0.44 - 1.00 mg/dL   Calcium 8.6 (L) 8.9 - 10.3 mg/dL   Total Protein 6.3 (L) 6.5 - 8.1 g/dL   Albumin 2.4 (L) 3.5 - 5.0 g/dL   AST 16 15 - 41 U/L   ALT 13 0 - 44 U/L   Alkaline Phosphatase 133 (H) 38 - 126 U/L   Total Bilirubin 0.3 0.3 - 1.2 mg/dL   GFR, Estimated >60 >60 mL/min   Anion gap 8 5 - 15    MAU Course  Procedures  MDM 1233PM DW Dr. Elly Modena, BPP is 8/10. Constant would like patient to stay on the monitor for more time and see if baby can become more reactive. If tracing becomes reactive then we can DC patient home with plan for her return tomorrow for IOL.   1328PM: Patient has had 1L of fluids and has had a snack. Reviewed tracing with Dr. Elly Modena, 10x10 accels noted, but no 15x15 accels. Dr. Elly Modena is ok with patient going home and then returning for IOL tonight. She would prefer a [place] IOL if available. Does not want me to place FB here prior to sending patient home.   1331PM: Spoke with labor and Veterinary surgeon, she has moved patient back to [place] IOL for 05/02/2021. Patient to return this evening around 23:45PM for IOL.   Assessment and Plan   1. Gestational hypertension, third trimester   2. [redacted] weeks gestation of pregnancy     DC home Pre-eclampsia warning signs reviewed  rd Trimester precautions  PTL precautions  Fetal kick counts RX: none  Patient to return this evening for MN IOL     Follow-up Information     [facility] Follow up.   Specialty: Obstetrics and Gynecology Why: Return this evening around 11:45PM for induction of labor Contact information: [address] [id number] [place] Hollister [place], CNM  05/01/21  1:44 PM

## 2021-05-02 ENCOUNTER — Encounter (HOSPITAL_COMMUNITY): Payer: Self-pay | Admitting: Obstetrics & Gynecology

## 2021-05-02 ENCOUNTER — Other Ambulatory Visit: Payer: Self-pay

## 2021-05-02 ENCOUNTER — Inpatient Hospital Stay (HOSPITAL_COMMUNITY): Payer: Medicaid Other | Admitting: Anesthesiology

## 2021-05-02 ENCOUNTER — Inpatient Hospital Stay (HOSPITAL_COMMUNITY)
Admission: AD | Admit: 2021-05-02 | Discharge: 2021-05-05 | DRG: 786 | Disposition: A | Discharge status: Home / Self Care | Payer: Medicaid Other | Attending: Obstetrics & Gynecology | Admitting: Obstetrics & Gynecology

## 2021-05-02 ENCOUNTER — Encounter (HOSPITAL_COMMUNITY)
Admission: AD | Disposition: A | Discharge status: Home / Self Care | Payer: Self-pay | Source: Home / Self Care | Attending: Obstetrics & Gynecology

## 2021-05-02 ENCOUNTER — Inpatient Hospital Stay (HOSPITAL_COMMUNITY)
Admission: AD | Admit: 2021-05-02 | Payer: Medicaid Other | Source: Home / Self Care | Admitting: Obstetrics and Gynecology

## 2021-05-02 ENCOUNTER — Inpatient Hospital Stay (HOSPITAL_COMMUNITY): Payer: Medicaid Other

## 2021-05-02 ENCOUNTER — Inpatient Hospital Stay (HOSPITAL_COMMUNITY): Payer: Medicaid Other | Attending: Obstetrics and Gynecology

## 2021-05-02 DIAGNOSIS — Z3A37 37 weeks gestation of pregnancy: Secondary | ICD-10-CM | POA: Diagnosis not present

## 2021-05-02 DIAGNOSIS — Z3400 Encounter for supervision of normal first pregnancy, unspecified trimester: Secondary | ICD-10-CM

## 2021-05-02 DIAGNOSIS — O99820 Streptococcus B carrier state complicating pregnancy: Secondary | ICD-10-CM | POA: Diagnosis not present

## 2021-05-02 DIAGNOSIS — O411230 Chorioamnionitis, third trimester, not applicable or unspecified: Secondary | ICD-10-CM | POA: Diagnosis not present

## 2021-05-02 DIAGNOSIS — O134 Gestational [pregnancy-induced] hypertension without significant proteinuria, complicating childbirth: Secondary | ICD-10-CM | POA: Diagnosis not present

## 2021-05-02 DIAGNOSIS — R000 Tachycardia, unspecified: Secondary | ICD-10-CM | POA: Diagnosis not present

## 2021-05-02 DIAGNOSIS — O133 Gestational [pregnancy-induced] hypertension without significant proteinuria, third trimester: Secondary | ICD-10-CM | POA: Diagnosis not present

## 2021-05-02 DIAGNOSIS — Z87891 Personal history of nicotine dependence: Secondary | ICD-10-CM | POA: Diagnosis not present

## 2021-05-02 DIAGNOSIS — O3413 Maternal care for benign tumor of corpus uteri, third trimester: Secondary | ICD-10-CM | POA: Diagnosis present

## 2021-05-02 DIAGNOSIS — D259 Leiomyoma of uterus, unspecified: Secondary | ICD-10-CM | POA: Diagnosis present

## 2021-05-02 DIAGNOSIS — O99892 Other specified diseases and conditions complicating childbirth: Secondary | ICD-10-CM | POA: Diagnosis not present

## 2021-05-02 DIAGNOSIS — O99824 Streptococcus B carrier state complicating childbirth: Secondary | ICD-10-CM | POA: Diagnosis present

## 2021-05-02 DIAGNOSIS — B951 Streptococcus, group B, as the cause of diseases classified elsewhere: Secondary | ICD-10-CM | POA: Diagnosis present

## 2021-05-02 DIAGNOSIS — O139 Gestational [pregnancy-induced] hypertension without significant proteinuria, unspecified trimester: Secondary | ICD-10-CM | POA: Diagnosis present

## 2021-05-02 DIAGNOSIS — O411290 Chorioamnionitis, unspecified trimester, not applicable or unspecified: Secondary | ICD-10-CM | POA: Diagnosis not present

## 2021-05-02 DIAGNOSIS — Z3A36 36 weeks gestation of pregnancy: Secondary | ICD-10-CM | POA: Diagnosis not present

## 2021-05-02 DIAGNOSIS — R7309 Other abnormal glucose: Secondary | ICD-10-CM | POA: Diagnosis present

## 2021-05-02 LAB — COMPREHENSIVE METABOLIC PANEL
ALT: 10 U/L (ref 0–44)
AST: 19 U/L (ref 15–41)
Albumin: 2.3 g/dL — ABNORMAL LOW (ref 3.5–5.0)
Alkaline Phosphatase: 139 U/L — ABNORMAL HIGH (ref 38–126)
Anion gap: 8 (ref 5–15)
BUN: 5 mg/dL — ABNORMAL LOW (ref 6–20)
CO2: 19 mmol/L — ABNORMAL LOW (ref 22–32)
Calcium: 8.7 mg/dL — ABNORMAL LOW (ref 8.9–10.3)
Chloride: 106 mmol/L (ref 98–111)
Creatinine, Ser: 0.57 mg/dL (ref 0.44–1.00)
GFR, Estimated: >60 mL/min (ref >60)
Glucose, Bld: 127 mg/dL — ABNORMAL HIGH (ref 70–99)
Potassium: 3.7 mmol/L (ref 3.5–5.1)
Sodium: 133 mmol/L — ABNORMAL LOW (ref 135–145)
Total Bilirubin: 0.3 mg/dL (ref 0.3–1.2)
Total Protein: 6 g/dL — ABNORMAL LOW (ref 6.5–8.1)

## 2021-05-02 LAB — PROTEIN / CREATININE RATIO, URINE
Creatinine, Urine: 82.04 mg/dL
Protein Creatinine Ratio: 0.21 mg/mg{Cre} — ABNORMAL HIGH (ref 0.00–0.15)
Total Protein, Urine: 17 mg/dL

## 2021-05-02 LAB — RPR: RPR Ser Ql: NONREACTIVE

## 2021-05-02 LAB — TYPE AND SCREEN
ABO/RH(D): O POS
Antibody Screen: NEGATIVE

## 2021-05-02 LAB — CBC
HCT: 36.6 % (ref 36.0–46.0)
HCT: 38.7 % (ref 36.0–46.0)
Hemoglobin: 12.4 g/dL (ref 12.0–15.0)
Hemoglobin: 13.1 g/dL (ref 12.0–15.0)
MCH: 30.2 pg (ref 26.0–34.0)
MCH: 30 pg (ref 26.0–34.0)
MCHC: 33.9 g/dL (ref 30.0–36.0)
MCHC: 33.9 g/dL (ref 30.0–36.0)
MCV: 89.1 fL (ref 80.0–100.0)
MCV: 88.6 fL (ref 80.0–100.0)
Platelets: 255 10*3/uL (ref 150–400)
Platelets: 248 10*3/uL (ref 150–400)
RBC: 4.11 MIL/uL (ref 3.87–5.11)
RBC: 4.37 MIL/uL (ref 3.87–5.11)
RDW: 12.8 % (ref 11.5–15.5)
RDW: 12.7 % (ref 11.5–15.5)
WBC: 9 10*3/uL (ref 4.0–10.5)
WBC: 14 10*3/uL — ABNORMAL HIGH (ref 4.0–10.5)
nRBC: 0 % (ref 0.0–0.2)
nRBC: 0 % (ref 0.0–0.2)

## 2021-05-02 SURGERY — Surgical Case
Anesthesia: Epidural

## 2021-05-02 MED ORDER — ACETAMINOPHEN 650 MG RE SUPP
650.0000 mg | Freq: Once | RECTAL | Status: AC
  Administered 2021-05-02: 650 mg via RECTAL
  Filled 2021-05-02: qty 1

## 2021-05-02 MED ORDER — PHENYLEPHRINE 40 MCG/ML (10ML) SYRINGE FOR IV PUSH (FOR BLOOD PRESSURE SUPPORT)
80.0000 ug | PREFILLED_SYRINGE | INTRAVENOUS | Status: DC | PRN
  Filled 2021-05-02: qty 10

## 2021-05-02 MED ORDER — OXYTOCIN-SODIUM CHLORIDE 30-0.9 UT/500ML-% IV SOLN: 2.5000 [IU]/h | INTRAVENOUS | Status: DC

## 2021-05-02 MED ORDER — LIDOCAINE HCL (PF) 1 % IJ SOLN
INTRAMUSCULAR | Status: DC | PRN
  Administered 2021-05-02 (×2): 4 mL via EPIDURAL

## 2021-05-02 MED ORDER — PHENYLEPHRINE 40 MCG/ML (10ML) SYRINGE FOR IV PUSH (FOR BLOOD PRESSURE SUPPORT)
80.0000 ug | PREFILLED_SYRINGE | INTRAVENOUS | Status: DC | PRN
  Administered 2021-05-02 (×2): 80 ug via INTRAVENOUS

## 2021-05-02 MED ORDER — MISOPROSTOL 25 MCG QUARTER TABLET: 25.0000 ug | ORAL_TABLET | ORAL | Status: DC | PRN

## 2021-05-02 MED ORDER — SOD CITRATE-CITRIC ACID 500-334 MG/5ML PO SOLN
30.0000 mL | ORAL | Status: DC | PRN
  Administered 2021-05-02: 30 mL via ORAL
  Filled 2021-05-02: qty 15

## 2021-05-02 MED ORDER — ACETAMINOPHEN 325 MG PO TABS
650.0000 mg | ORAL_TABLET | ORAL | Status: DC | PRN
  Filled 2021-05-02: qty 2

## 2021-05-02 MED ORDER — OXYCODONE-ACETAMINOPHEN 5-325 MG PO TABS: 2.0000 | ORAL_TABLET | ORAL | Status: DC | PRN

## 2021-05-02 MED ORDER — OXYCODONE-ACETAMINOPHEN 5-325 MG PO TABS: 1.0000 | ORAL_TABLET | ORAL | Status: DC | PRN

## 2021-05-02 MED ORDER — LACTATED RINGERS IV SOLN: INTRAVENOUS | Status: DC

## 2021-05-02 MED ORDER — SODIUM CHLORIDE 0.9 % IV SOLN
2.0000 g | Freq: Four times a day (QID) | INTRAVENOUS | Status: DC
  Administered 2021-05-02: 2 g via INTRAVENOUS
  Filled 2021-05-02: qty 2000

## 2021-05-02 MED ORDER — ONDANSETRON HCL 4 MG/2ML IJ SOLN
INTRAMUSCULAR | Status: AC
  Filled 2021-05-02: qty 2

## 2021-05-02 MED ORDER — EPINEPHRINE PF 1 MG/ML IJ SOLN
INTRAMUSCULAR | Status: AC
  Filled 2021-05-02: qty 1

## 2021-05-02 MED ORDER — LIDOCAINE-EPINEPHRINE (PF) 2 %-1:200000 IJ SOLN
INTRAMUSCULAR | Status: DC | PRN
  Administered 2021-05-02 – 2021-05-03 (×2): 5 mL via EPIDURAL

## 2021-05-02 MED ORDER — PENICILLIN G POT IN DEXTROSE 60000 UNIT/ML IV SOLN
3.0000 10*6.[IU] | INTRAVENOUS | Status: DC
  Administered 2021-05-02 (×3): 3 10*6.[IU] via INTRAVENOUS
  Filled 2021-05-02 (×4): qty 50

## 2021-05-02 MED ORDER — EPHEDRINE 5 MG/ML INJ: 10.0000 mg | INTRAVENOUS | Status: DC | PRN

## 2021-05-02 MED ORDER — CLINDAMYCIN PHOSPHATE 900 MG/50ML IV SOLN
INTRAVENOUS | Status: AC
  Filled 2021-05-02: qty 50

## 2021-05-02 MED ORDER — LACTATED RINGERS AMNIOINFUSION: INTRAVENOUS | Status: DC

## 2021-05-02 MED ORDER — OXYTOCIN-SODIUM CHLORIDE 30-0.9 UT/500ML-% IV SOLN
INTRAVENOUS | Status: AC
  Filled 2021-05-02: qty 500

## 2021-05-02 MED ORDER — OXYTOCIN-SODIUM CHLORIDE 30-0.9 UT/500ML-% IV SOLN
1.0000 m[IU]/min | INTRAVENOUS | Status: DC
  Administered 2021-05-02 (×2): 2 m[IU]/min via INTRAVENOUS
  Filled 2021-05-02: qty 500

## 2021-05-02 MED ORDER — OXYTOCIN BOLUS FROM INFUSION: 333.0000 mL | Freq: Once | INTRAVENOUS | Status: DC

## 2021-05-02 MED ORDER — MISOPROSTOL 50MCG HALF TABLET
50.0000 ug | ORAL_TABLET | ORAL | Status: DC | PRN
  Administered 2021-05-02 (×3): 50 ug via ORAL
  Filled 2021-05-02 (×2): qty 1

## 2021-05-02 MED ORDER — DEXAMETHASONE SODIUM PHOSPHATE 4 MG/ML IJ SOLN
INTRAMUSCULAR | Status: AC
  Filled 2021-05-02: qty 1

## 2021-05-02 MED ORDER — GENTAMICIN SULFATE 40 MG/ML IJ SOLN
5.0000 mg/kg | INTRAVENOUS | Status: DC
  Administered 2021-05-02: 340 mg via INTRAVENOUS
  Filled 2021-05-02: qty 8.5

## 2021-05-02 MED ORDER — ONDANSETRON HCL 4 MG/2ML IJ SOLN
4.0000 mg | Freq: Four times a day (QID) | INTRAMUSCULAR | Status: DC | PRN
  Administered 2021-05-02 (×2): 4 mg via INTRAVENOUS
  Filled 2021-05-02 (×2): qty 2

## 2021-05-02 MED ORDER — FENTANYL CITRATE (PF) 100 MCG/2ML IJ SOLN
100.0000 ug | INTRAMUSCULAR | Status: DC | PRN
  Administered 2021-05-02 (×4): 100 ug via INTRAVENOUS
  Filled 2021-05-02 (×4): qty 2

## 2021-05-02 MED ORDER — LIDOCAINE HCL (PF) 1 % IJ SOLN
30.0000 mL | INTRAMUSCULAR | Status: DC | PRN
Start: 2021-05-02 — End: 2021-05-03

## 2021-05-02 MED ORDER — CLINDAMYCIN PHOSPHATE 900 MG/50ML IV SOLN
900.0000 mg | Freq: Once | INTRAVENOUS | Status: AC
  Administered 2021-05-03: 900 mg via INTRAVENOUS

## 2021-05-02 MED ORDER — TERBUTALINE SULFATE 1 MG/ML IJ SOLN
0.2500 mg | Freq: Once | INTRAMUSCULAR | Status: AC | PRN
  Administered 2021-05-02: 0.25 mg via SUBCUTANEOUS
  Filled 2021-05-02: qty 1

## 2021-05-02 MED ORDER — MISOPROSTOL 50MCG HALF TABLET
ORAL_TABLET | ORAL | Status: AC
  Filled 2021-05-02: qty 1

## 2021-05-02 MED ORDER — LIDOCAINE HCL (PF) 2 % IJ SOLN
INTRAMUSCULAR | Status: AC
  Filled 2021-05-02: qty 10

## 2021-05-02 MED ORDER — LACTATED RINGERS IV SOLN
500.0000 mL | INTRAVENOUS | Status: DC | PRN
  Administered 2021-05-02: 1000 mL via INTRAVENOUS

## 2021-05-02 MED ORDER — LACTATED RINGERS IV SOLN
500.0000 mL | Freq: Once | INTRAVENOUS | Status: AC
  Administered 2021-05-02: 500 mL via INTRAVENOUS

## 2021-05-02 MED ORDER — MORPHINE SULFATE (PF) 0.5 MG/ML IJ SOLN
INTRAMUSCULAR | Status: AC
  Filled 2021-05-02: qty 10

## 2021-05-02 MED ORDER — SODIUM CHLORIDE 0.9 % IV SOLN
5.0000 10*6.[IU] | Freq: Once | INTRAVENOUS | Status: AC
  Administered 2021-05-02: 5 10*6.[IU] via INTRAVENOUS
  Filled 2021-05-02: qty 5

## 2021-05-02 MED ORDER — DIPHENHYDRAMINE HCL 50 MG/ML IJ SOLN: 12.5000 mg | INTRAMUSCULAR | Status: DC | PRN

## 2021-05-02 MED ORDER — TERBUTALINE SULFATE 1 MG/ML IJ SOLN: 0.2500 mg | Freq: Once | INTRAMUSCULAR | Status: DC | PRN

## 2021-05-02 MED ORDER — FENTANYL-BUPIVACAINE-NACL 0.5-0.125-0.9 MG/250ML-% EP SOLN
12.0000 mL/h | EPIDURAL | Status: DC | PRN
  Administered 2021-05-02: 12 mL/h via EPIDURAL
  Filled 2021-05-02: qty 250

## 2021-05-02 SURGICAL SUPPLY — 38 items
BENZOIN TINCTURE PRP APPL 2/3 (GAUZE/BANDAGES/DRESSINGS) ×3 IMPLANT
CHLORAPREP W/TINT 26ML (MISCELLANEOUS) ×3 IMPLANT
CLAMP CORD UMBIL (MISCELLANEOUS) IMPLANT
CLOSURE WOUND 1/2 X4 (GAUZE/BANDAGES/DRESSINGS) ×1
CLOTH BEACON ORANGE TIMEOUT ST (SAFETY) ×3 IMPLANT
DERMABOND ADVANCED (GAUZE/BANDAGES/DRESSINGS)
DERMABOND ADVANCED .7 DNX12 (GAUZE/BANDAGES/DRESSINGS) IMPLANT
DRSG OPSITE POSTOP 4X10 (GAUZE/BANDAGES/DRESSINGS) ×3 IMPLANT
ELECT REM PT RETURN 9FT ADLT (ELECTROSURGICAL) ×3
ELECTRODE REM PT RTRN 9FT ADLT (ELECTROSURGICAL) ×1 IMPLANT
EXTRACTOR VACUUM KIWI (MISCELLANEOUS) IMPLANT
GLOVE BIOGEL PI IND STRL 7.0 (GLOVE) ×3 IMPLANT
GLOVE BIOGEL PI INDICATOR 7.0 (GLOVE) ×6
GLOVE ECLIPSE 6.5 STRL STRAW (GLOVE) ×3 IMPLANT
GOWN STRL REUS W/TWL LRG LVL3 (GOWN DISPOSABLE) ×9 IMPLANT
HEMOSTAT SURGICEL 2X14 (HEMOSTASIS) ×3 IMPLANT
KIT ABG SYR 3ML LUER SLIP (SYRINGE) IMPLANT
NEEDLE HYPO 25X5/8 SAFETYGLIDE (NEEDLE) IMPLANT
NS IRRIG 1000ML POUR BTL (IV SOLUTION) ×3 IMPLANT
PACK C SECTION WH (CUSTOM PROCEDURE TRAY) ×3 IMPLANT
PAD ABD 7.5X8 STRL (GAUZE/BANDAGES/DRESSINGS) ×3 IMPLANT
PAD OB MATERNITY 4.3X12.25 (PERSONAL CARE ITEMS) ×3 IMPLANT
PENCIL SMOKE EVAC W/HOLSTER (ELECTROSURGICAL) ×3 IMPLANT
RETAINER VISCERAL (MISCELLANEOUS) ×3 IMPLANT
RTRCTR C-SECT PINK 25CM LRG (MISCELLANEOUS) ×3 IMPLANT
STRIP CLOSURE SKIN 1/2X4 (GAUZE/BANDAGES/DRESSINGS) ×2 IMPLANT
SUT PLAIN 0 NONE (SUTURE) IMPLANT
SUT PLAIN 2 0 XLH (SUTURE) IMPLANT
SUT VIC AB 0 CT1 27 (SUTURE) ×4
SUT VIC AB 0 CT1 27XBRD ANBCTR (SUTURE) ×2 IMPLANT
SUT VIC AB 0 CTX 36 (SUTURE) ×6
SUT VIC AB 0 CTX36XBRD ANBCTRL (SUTURE) ×3 IMPLANT
SUT VIC AB 2-0 CT1 27 (SUTURE) ×2
SUT VIC AB 2-0 CT1 TAPERPNT 27 (SUTURE) ×1 IMPLANT
SUT VIC AB 4-0 KS 27 (SUTURE) ×3 IMPLANT
TOWEL OR 17X24 6PK STRL BLUE (TOWEL DISPOSABLE) ×3 IMPLANT
TRAY FOLEY W/BAG SLVR 14FR LF (SET/KITS/TRAYS/PACK) IMPLANT
WATER STERILE IRR 1000ML POUR (IV SOLUTION) ×3 IMPLANT

## 2021-05-02 NOTE — Progress Notes (Signed)
Labor Progress Note Jessica Hansen is a 25 y.o. G1P0 at [redacted]w[redacted]d presented for IOL for gHTN  S:  Feeling more ctx. May want epidural soon.   O:  BP 119/70   Pulse 73   Temp 97.9 F (36.6 C) (Oral)   Resp 20   Ht 5\' 3"  (1.6 m)   Wt 91.4 kg   LMP 08/16/2020   SpO2 99%   BMI 35.69 kg/m  EFM: baseline 140 bpm/ min-mod variability/ no accels/ no decels  Toco/IUPC: 2-4 SVE: Dilation: 1 Effacement (%): 50 Station: -3 Presentation: Vertex Exam by:: Dr. Sylvester Harder   A/P: 25 y.o. G1P0 [redacted]w[redacted]d  1. Labor: latent 2. FWB: Cat II 3. Pain: analgesia/anesthesia/NO prn 4. gHTN: stable  FB out. Plan for Pitocin. Vtx confirmed by BSUS. Anticipate labor progress and SVD.  Julianne Handler, CNM 2:12 PM

## 2021-05-02 NOTE — Progress Notes (Signed)
Reported to bedside due to recurrent late/variable decels on FHT. IUPC/FSE in place, after last pitocin break, pit has been titrated up to 42mL/hr. Patient has ruled in for triple I based on fever, maternal tachycardia, fetal tachycardia. Discussed given NRFHT, fetal intolerance of labor will proceed with cesarean section at this time. Patient previously consented, Dr. Nelda Marseille to bedside to sign consent forms with patient.  Arrie Senate, MD OB Fellow, [place] for [facility] 05/02/2021 11:50 PM

## 2021-05-02 NOTE — Progress Notes (Signed)
Labor Progress Note Jessica Hansen is a 25 y.o. G1P0 at [redacted]w[redacted]d presented for IOL secondary to gHTN  S: Comfortable with epidural.   O:  BP 118/63   Pulse 97   Temp 99.4 F (37.4 C) (Oral)   Resp 19   Ht 5\' 3"  (1.6 m)   Wt 91.4 kg   LMP 08/16/2020   SpO2 100%   BMI 35.69 kg/m  EFM: baseline 150/min-mod variability/no accels/recurrent late decels and some variables  CVE: Dilation: 5 Effacement (%): 60 Station: 0 Presentation: Vertex Exam by:: Dr Bruna Potter   A&P: 25 y.o. G1P0 [redacted]w[redacted]d for IOL secondary to gHTN #Labor: Latent. Progressing well. Pitocin at 6 mu/min. Given category II strip and difficulty detecting fetal heart rate, will place IUPC and FSE. Will also continue bolus fluids and repositioning #Pain: epidural #FWB: cat II #GBS positive: on PCN gHTN: intermittent mild range BP, labs reassuring  Mee Hives, MD 8:54 PM

## 2021-05-02 NOTE — Progress Notes (Signed)
Labor Progress Note Galina Haddox is a 25 y.o. G1P0 at [redacted]w[redacted]d presented for IOL for gHTN  S:  Feeling more cramping, just had Fentanyl.   O:  BP 124/66   Pulse 74   Temp 98.3 F (36.8 C) (Oral)   Resp 17   Ht 5\' 3"  (1.6 m)   Wt 91.4 kg   LMP 08/16/2020   SpO2 99%   BMI 35.69 kg/m  EFM: baseline 140 bpm/ min variability/ no accels/ no decels  Toco/IUPC: irreg SVE: Dilation: 1 Effacement (%): 50 Station: -3 Presentation: Vertex Exam by:: Dr. Sylvester Harder   A/P: 25 y.o. G1P0 [redacted]w[redacted]d  1. Labor: latent 2. FWB: Cat II 3. Pain: analgesia/anesthesia/NO prn 4. gHTN: stable  S/p Cytotec x1 and Cook cath in place. Vaginal balloon deflated for pt comfort. Continue Cytotec. Pitocin when cath out.  Anticipate labor progress and SVD.  Julianne Handler, CNM 11:51 AM

## 2021-05-02 NOTE — Progress Notes (Signed)
Tension applied to cook cath, vaginal balloon seen but uterine felt in cervix with little dilation.  Order given to deflate vaginal balloon.

## 2021-05-02 NOTE — Progress Notes (Addendum)
Labor Progress Note Jessica Hansen is a 25 y.o. G1P0 at [redacted]w[redacted]d presented for IOL secondary to gHTN S: Feeling strong contractions, wanting to get epidural  O:  BP 138/72   Pulse 87   Temp 98.2 F (36.8 C) (Oral)   Resp 19   Ht 5\' 3"  (1.6 m)   Wt 91.4 kg   LMP 08/16/2020   SpO2 99%   BMI 35.69 kg/m  EFM: baseline 150/min-mod variability/no accels/occasional variable and late decels  CVE: Dilation: 5 Effacement (%): 60 Station: -2 Presentation: Vertex (confirmed by [place]) Exam by:: Dr. Sylvester Harder   A&P: 25 y.o. G1P0 [redacted]w[redacted]d for IOL secondary to gHTN #Labor: Latent. Progressing well. Pitocin at 6 mu/min currently. Plan to check cervix after epidural placed #Pain: epidural in process #FWB: cat II #GBS positive: on PCN gHTN: intermittent mild range BP, labs reassuring  Zola Button, MD 6:28 PM   Midwife attestation I agree with the documentation in the resident's note.   Julianne Handler, CNM 7:02 PM

## 2021-05-02 NOTE — Progress Notes (Signed)
Called to bedside by RN for recurrent late/variable decels on FHT. IUPC/FSE placed approx 20 min ago. Pitocin stopped. Terb administered. Maternal VSS. LR bolus given. Position changes. Will start amnio infusion at this time. Discussed prospect of cesarean section in the setting of NRFHT should FHT not improve.  The risks of cesarean section were discussed with the patient including but were not limited to: bleeding which may require transfusion or reoperation; infection which may require antibiotics; injury to bowel, bladder, ureters or other surrounding organs; injury to the fetus; need for additional procedures including hysterectomy in the event of a life-threatening hemorrhage; placental abnormalities wth subsequent pregnancies, incisional problems, thromboembolic phenomenon and other postoperative/anesthesia complications.   Arrie Senate, MD OB Fellow, [place] for [place] 05/02/2021 8:52 PM

## 2021-05-02 NOTE — H&P (Signed)
OBSTETRIC ADMISSION HISTORY AND PHYSICAL  Jessica Hansen is a 25 y.o. female G1P0 with IUP at [redacted]w[redacted]d by LMP presenting for IOL-gHTN. She reports +FMs, No LOF, no VB, no blurry vision, headaches or peripheral edema, and RUQ pain.  She plans on breast feeding. She request POPs for birth control. She received her prenatal care at  [facility] ([place])    Dating: By LMP --->  Estimated Date of Delivery: 05/23/21  Sono:    05/02/21@[redacted]w[redacted]d , CWD, normal anatomy, cephalic presentation, posterior placental lie, 269g, 46% EFW   Prenatal History/Complications:  GBS positive gHTN  Past Medical History: Past Medical History:  Diagnosis Date   Pregnancy induced hypertension     Past Surgical History: History reviewed. No pertinent surgical history.  Obstetrical History: OB History     Gravida  1   Para      Term      Preterm      AB      Living         SAB      IAB      Ectopic      Multiple      Live Births              Social History Social History   Socioeconomic History   Marital status: Single    Spouse name: Not on file   Number of children: Not on file   Years of education: Not on file   Highest education level: Not on file  Occupational History   Not on file  Tobacco Use   Smoking status: Former    Pack years: 0.00   Smokeless tobacco: Never  Vaping Use   Vaping Use: Never used  Substance and Sexual Activity   Alcohol use: Not Currently    Comment: social   Drug use: Never   Sexual activity: Not Currently    Birth control/protection: None  Other Topics Concern   Not on file  Social History Narrative   Not on file   Social Determinants of Health   Financial Resource Strain: Not on file  Food Insecurity: Not on file  Transportation Needs: Not on file  Physical Activity: Not on file  Stress: Not on file  Social Connections: Not on file    Family History: Family History  Problem Relation Age of Onset   Depression Mother    Healthy Father      Allergies: Allergies  Allergen Reactions   Celery Oil Hives    Medications Prior to Admission  Medication Sig Dispense Refill Last Dose   acetaminophen (TYLENOL) 500 MG tablet Take 500 mg by mouth every 6 (six) hours as needed for headache.      butalbital-acetaminophen-caffeine (FIORICET) 50-325-40 MG tablet Take 1-2 tablets by mouth every 6 (six) hours as needed for headache. 10 tablet 0    cyclobenzaprine (FLEXERIL) 10 MG tablet Take 1 tablet (10 mg total) by mouth 2 (two) times daily as needed for muscle spasms. 20 tablet 0    famotidine (PEPCID) 20 MG tablet Take 1 tablet (20 mg total) by mouth 2 (two) times daily. 60 tablet 3    Prenatal Vit-Fe Fumarate-FA (PRENATAL VITAMINS PO) Take by mouth.        Review of Systems   All systems reviewed and negative except as stated in HPI  Temperature 98.2 F (36.8 C), last menstrual period 08/16/2020, SpO2 100 %. General appearance: alert, cooperative, and no distress Lungs: normal respiratory effort Heart: regular rate and rhythm Abdomen: soft,  non-tender; gravid Pelvic: as noted below Extremities: Homans sign is negative, no sign of DVT Presentation: cephalic by cervical exam Fetal monitoringBaseline: 150 bpm, Variability: Good {> 6 bpm), Accelerations: Reactive, and Decelerations: Absent Uterine activity irritable  Dilation: 1 Effacement (%): 50 Station: -3 Exam by:: Sylvester Harder, MD   Prenatal labs: ABO, Rh: O/Positive/-- (11/16 1119) Antibody: Negative (11/16 1119) Rubella: 1.06 (11/16 1119) RPR: Non Reactive (04/14 0841)  HBsAg: Negative (11/16 1119)  HIV: Non Reactive (04/14 0841)  GBS: Positive/-- (06/07 0846)  2 hr Glucola passed Genetic screening  normal Anatomy US normal  Prenatal Transfer Tool  Maternal Diabetes: No Genetic Screening: Normal Maternal Ultrasounds/Referrals: Normal Fetal Ultrasounds or other Referrals:  None Maternal Substance Abuse:  No Significant Maternal Medications:   None Significant Maternal Lab Results: Group B Strep positive  Results for orders placed or performed during the hospital encounter of 05/01/21 (from the past 24 hour(s))  Urinalysis, Routine w reflex microscopic Urine, Clean Catch   Collection Time: 05/01/21  9:51 AM  Result Value Ref Range   Color, Urine STRAW (A) YELLOW   APPearance CLEAR CLEAR   Specific Gravity, Urine 1.006 1.005 - 1.030   pH 7.0 5.0 - 8.0   Glucose, UA NEGATIVE NEGATIVE mg/dL   Hgb urine dipstick NEGATIVE NEGATIVE   Bilirubin Urine NEGATIVE NEGATIVE   Ketones, ur NEGATIVE NEGATIVE mg/dL   Protein, ur NEGATIVE NEGATIVE mg/dL   Nitrite NEGATIVE NEGATIVE   Leukocytes,Ua TRACE (A) NEGATIVE   RBC / HPF 0-5 0 - 5 RBC/hpf   WBC, UA 0-5 0 - 5 WBC/hpf   Bacteria, UA RARE (A) NONE SEEN   Squamous Epithelial / LPF 0-5 0 - 5  Protein / creatinine ratio, urine   Collection Time: 05/01/21 10:13 AM  Result Value Ref Range   Creatinine, Urine 38.95 mg/dL   Total Protein, Urine 7 mg/dL   Protein Creatinine Ratio 0.18 (H) 0.00 - 0.15 mg/mg[Cre]  CBC with Differential/Platelet   Collection Time: 05/01/21 10:30 AM  Result Value Ref Range   WBC 7.2 4.0 - 10.5 K/uL   RBC 4.29 3.87 - 5.11 MIL/uL   Hemoglobin 12.9 12.0 - 15.0 g/dL   HCT 37.7 36.0 - 46.0 %   MCV 87.9 80.0 - 100.0 fL   MCH 30.1 26.0 - 34.0 pg   MCHC 34.2 30.0 - 36.0 g/dL   RDW 12.6 11.5 - 15.5 %   Platelets 231 150 - 400 K/uL   nRBC 0.0 0.0 - 0.2 %   Neutrophils Relative % 60 %   Neutro Abs 4.3 1.7 - 7.7 K/uL   Lymphocytes Relative 28 %   Lymphs Abs 2.0 0.7 - 4.0 K/uL   Monocytes Relative 9 %   Monocytes Absolute 0.6 0.1 - 1.0 K/uL   Eosinophils Relative 2 %   Eosinophils Absolute 0.1 0.0 - 0.5 K/uL   Basophils Relative 0 %   Basophils Absolute 0.0 0.0 - 0.1 K/uL   Immature Granulocytes 1 %   Abs Immature Granulocytes 0.04 0.00 - 0.07 K/uL  Comprehensive metabolic panel   Collection Time: 05/01/21 10:30 AM  Result Value Ref Range   Sodium 134 (L)  135 - 145 mmol/L   Potassium 4.0 3.5 - 5.1 mmol/L   Chloride 108 98 - 111 mmol/L   CO2 18 (L) 22 - 32 mmol/L   Glucose, Bld 91 70 - 99 mg/dL   BUN 5 (L) 6 - 20 mg/dL   Creatinine, Ser 0.54 0.44 - 1.00 mg/dL   Calcium  8.6 (L) 8.9 - 10.3 mg/dL   Total Protein 6.3 (L) 6.5 - 8.1 g/dL   Albumin 2.4 (L) 3.5 - 5.0 g/dL   AST 16 15 - 41 U/L   ALT 13 0 - 44 U/L   Alkaline Phosphatase 133 (H) 38 - 126 U/L   Total Bilirubin 0.3 0.3 - 1.2 mg/dL   GFR, Estimated >60 >60 mL/min   Anion gap 8 5 - 15    Patient Active Problem List   Diagnosis Date Noted   Positive GBS test 04/29/2021   Gestational hypertension 04/28/2021   Uterine fibroid 04/12/2021   Bacterial vaginosis in pregnancy 02/02/2021   Elevated hemoglobin A1c 11/09/2020   Supervision of normal first pregnancy, antepartum 10/05/2020    Assessment/Plan:  Paitynn Mikus is a 25 y.o. G1P0 at [redacted]w[redacted]d here for IOL-gHTN.  #IOL: Discussed IOL process with patient. Given cervical exam will dose cytotec and attempt FB placement at next exam. #Pain: PRN #FWB: cat 1 #ID: GBS pos, PCN #MOF: breast #MOC:POPs #Circ:  N/a #gHTN: asymptomatic, preE labs pending. Mild range BP.  Arrie Senate, MD  05/02/2021, 12:32 AM

## 2021-05-02 NOTE — Anesthesia Procedure Notes (Signed)
Epidural Patient location during procedure: OB Start time: 05/02/2021 7:37 PM End time: 05/02/2021 7:42 PM  Staffing Anesthesiologist: Audry Pili, MD Performed: anesthesiologist   Preanesthetic Checklist Completed: patient identified, IV checked, risks and benefits discussed, monitors and equipment checked, pre-op evaluation and timeout performed  Epidural Patient position: sitting Prep: DuraPrep Patient monitoring: continuous pulse ox and blood pressure Approach: midline Location: L2-L3 Injection technique: LOR saline  Needle:  Needle type: Tuohy  Needle gauge: 17 G Needle length: 9 cm Needle insertion depth: 9 cm Catheter size: 19 Gauge Catheter at skin depth: 13 cm Test dose: negative and Other (1% lidocaine)  Assessment Events: blood not aspirated  Additional Notes Patient identified. Risks including, but not limited to, bleeding, infection, nerve damage, paralysis, inadequate analgesia, blood pressure changes, nausea, vomiting, allergic reaction, postpartum back pain, itching, and headache were discussed. Patient expressed understanding and wished to proceed. Sterile prep and drape, including hand hygiene, mask, and sterile gloves were used. The patient was positioned and the spine was prepped. The skin was anesthetized with lidocaine. No paraesthesia or other complication noted. The patient did not experience any signs of intravascular injection such as tinnitus or metallic taste in mouth, nor signs of intrathecal spread such as rapid motor block. Please see nursing notes for vital signs. The patient tolerated the procedure well.   Renold Don, MDReason for block:procedure for pain

## 2021-05-02 NOTE — Anesthesia Preprocedure Evaluation (Addendum)
Anesthesia Evaluation  Patient identified by MRN, date of birth, ID band Patient awake    Reviewed: Allergy & Precautions, NPO status , Patient's Chart, lab work & pertinent test results  History of Anesthesia Complications Negative for: history of anesthetic complications  Airway Mallampati: II   Neck ROM: Full    Dental   Pulmonary former smoker,    Pulmonary exam normal        Cardiovascular hypertension (gHTN), Normal cardiovascular exam     Neuro/Psych negative neurological ROS  negative psych ROS   GI/Hepatic negative GI ROS, Neg liver ROS,   Endo/Other   Obesity   Renal/GU negative Renal ROS     Musculoskeletal negative musculoskeletal ROS (+)   Abdominal   Peds  Hematology negative hematology ROS (+)  Plt 248k    Anesthesia Other Findings Covid test negative   Reproductive/Obstetrics (+) Pregnancy                            Anesthesia Physical Anesthesia Plan  ASA: 2  Anesthesia Plan: Epidural   Post-op Pain Management:    Induction:   PONV Risk Score and Plan: 2 and Treatment may vary due to age or medical condition  Airway Management Planned: Natural Airway  Additional Equipment: None  Intra-op Plan:   Post-operative Plan:   Informed Consent: I have reviewed the patients History and Physical, chart, labs and discussed the procedure including the risks, benefits and alternatives for the proposed anesthesia with the patient or authorized representative who has indicated his/her understanding and acceptance.       Plan Discussed with: Anesthesiologist  Anesthesia Plan Comments: (Labs reviewed. Platelets acceptable, patient not taking any blood thinning medications. Per RN, FHR tracing reported to be stable enough for sitting procedure. Risks and benefits discussed with patient, including PDPH, backache, epidural hematoma, failed epidural, blood pressure  changes, allergic reaction, and nerve injury. Patient expressed understanding and wished to proceed.)       Anesthesia Quick Evaluation

## 2021-05-02 NOTE — Progress Notes (Signed)
Labor Progress Note Jennfier Abdulla is a 25 y.o. G1P0 at [redacted]w[redacted]d presented for IOL-gHTN. S: Doing well without complaints.  O:  BP 136/86   Pulse 82   Temp 98.2 F (36.8 C)   Resp 17   Ht 5\' 3"  (1.6 m)   Wt 91.4 kg   LMP 08/16/2020   SpO2 100%   BMI 35.69 kg/m  EFM: baseline 150bpm/mod variability/+ accels/no decels Toco: q1-3 min  CVE: Dilation: 1 Effacement (%): 50 Station: -3 Presentation: Vertex Exam by:: Sylvester Harder, MD   A&P: 25 y.o. G1P0 [redacted]w[redacted]d presented for IOL-gHTN. #IOL: S/p cyto x1. Cook's catheter placed without difficulty 80cc/80cc. Cytotec re-dosed. #Pain: PRN #FWB: cat 1 #GBS positive, PCN #gHTN: asymptomatic, preE labs nml. Mild range BP.  Arrie Senate, MD 4:57 AM

## 2021-05-03 ENCOUNTER — Encounter (HOSPITAL_COMMUNITY): Payer: Self-pay | Admitting: Obstetrics & Gynecology

## 2021-05-03 ENCOUNTER — Encounter: Payer: Medicaid Other | Admitting: Advanced Practice Midwife

## 2021-05-03 DIAGNOSIS — O411230 Chorioamnionitis, third trimester, not applicable or unspecified: Secondary | ICD-10-CM

## 2021-05-03 DIAGNOSIS — O134 Gestational [pregnancy-induced] hypertension without significant proteinuria, complicating childbirth: Secondary | ICD-10-CM

## 2021-05-03 DIAGNOSIS — O411290 Chorioamnionitis, unspecified trimester, not applicable or unspecified: Secondary | ICD-10-CM | POA: Diagnosis not present

## 2021-05-03 DIAGNOSIS — Z3A37 37 weeks gestation of pregnancy: Secondary | ICD-10-CM

## 2021-05-03 DIAGNOSIS — O99820 Streptococcus B carrier state complicating pregnancy: Secondary | ICD-10-CM

## 2021-05-03 LAB — CBC
HCT: 34.4 % — ABNORMAL LOW (ref 36.0–46.0)
HCT: 33.7 % — ABNORMAL LOW (ref 36.0–46.0)
Hemoglobin: 11.7 g/dL — ABNORMAL LOW (ref 12.0–15.0)
Hemoglobin: 11.6 g/dL — ABNORMAL LOW (ref 12.0–15.0)
MCH: 30.2 pg (ref 26.0–34.0)
MCH: 30.4 pg (ref 26.0–34.0)
MCHC: 34 g/dL (ref 30.0–36.0)
MCHC: 34.4 g/dL (ref 30.0–36.0)
MCV: 88.9 fL (ref 80.0–100.0)
MCV: 88.2 fL (ref 80.0–100.0)
Platelets: 215 10*3/uL (ref 150–400)
Platelets: 207 10*3/uL (ref 150–400)
RBC: 3.87 MIL/uL (ref 3.87–5.11)
RBC: 3.82 MIL/uL — ABNORMAL LOW (ref 3.87–5.11)
RDW: 12.9 % (ref 11.5–15.5)
RDW: 12.9 % (ref 11.5–15.5)
WBC: 9.8 10*3/uL (ref 4.0–10.5)
WBC: 13 10*3/uL — ABNORMAL HIGH (ref 4.0–10.5)
nRBC: 0 % (ref 0.0–0.2)
nRBC: 0 % (ref 0.0–0.2)

## 2021-05-03 MED ORDER — DIPHENHYDRAMINE HCL 50 MG/ML IJ SOLN: 12.5000 mg | INTRAMUSCULAR | Status: DC | PRN

## 2021-05-03 MED ORDER — COCONUT OIL OIL: 1.0000 "application " | TOPICAL_OIL | Status: DC | PRN

## 2021-05-03 MED ORDER — MENTHOL 3 MG MT LOZG: 1.0000 | LOZENGE | OROMUCOSAL | Status: DC | PRN

## 2021-05-03 MED ORDER — OXYTOCIN-SODIUM CHLORIDE 30-0.9 UT/500ML-% IV SOLN
2.5000 [IU]/h | INTRAVENOUS | Status: AC
  Administered 2021-05-03: 2.5 [IU]/h via INTRAVENOUS
  Filled 2021-05-03: qty 500

## 2021-05-03 MED ORDER — OXYCODONE HCL 5 MG/5ML PO SOLN: 5.0000 mg | Freq: Once | ORAL | Status: DC | PRN

## 2021-05-03 MED ORDER — KETOROLAC TROMETHAMINE 30 MG/ML IJ SOLN: 30.0000 mg | Freq: Four times a day (QID) | INTRAMUSCULAR | Status: DC | PRN

## 2021-05-03 MED ORDER — NALBUPHINE HCL 10 MG/ML IJ SOLN
5.0000 mg | INTRAMUSCULAR | Status: DC | PRN
  Filled 2021-05-03: qty 1

## 2021-05-03 MED ORDER — WITCH HAZEL-GLYCERIN EX PADS
1.0000 | MEDICATED_PAD | CUTANEOUS | Status: DC | PRN
Start: 2021-05-03 — End: 2021-05-05

## 2021-05-03 MED ORDER — ENOXAPARIN SODIUM 40 MG/0.4ML IJ SOSY
40.0000 mg | PREFILLED_SYRINGE | INTRAMUSCULAR | Status: DC
  Administered 2021-05-03 – 2021-05-04 (×2): 40 mg via SUBCUTANEOUS
  Filled 2021-05-03 (×2): qty 0.4

## 2021-05-03 MED ORDER — IBUPROFEN 600 MG PO TABS
600.0000 mg | ORAL_TABLET | Freq: Four times a day (QID) | ORAL | Status: DC
  Administered 2021-05-04 – 2021-05-05 (×5): 600 mg via ORAL
  Filled 2021-05-03 (×5): qty 1

## 2021-05-03 MED ORDER — SODIUM CHLORIDE 0.9 % IR SOLN
Status: DC | PRN
  Administered 2021-05-03: 1

## 2021-05-03 MED ORDER — SODIUM CHLORIDE 0.9% FLUSH: 3.0000 mL | INTRAVENOUS | Status: DC | PRN

## 2021-05-03 MED ORDER — MEPERIDINE HCL 25 MG/ML IJ SOLN
6.2500 mg | INTRAMUSCULAR | Status: DC | PRN
Start: 2021-05-03 — End: 2021-05-03

## 2021-05-03 MED ORDER — OXYCODONE HCL 5 MG PO TABS: 5.0000 mg | ORAL_TABLET | Freq: Once | ORAL | Status: DC | PRN

## 2021-05-03 MED ORDER — NALOXONE HCL 0.4 MG/ML IJ SOLN: 0.4000 mg | INTRAMUSCULAR | Status: DC | PRN

## 2021-05-03 MED ORDER — ACETAMINOPHEN 325 MG PO TABS
650.0000 mg | ORAL_TABLET | ORAL | Status: DC | PRN
  Administered 2021-05-03 (×3): 650 mg via ORAL
  Filled 2021-05-03 (×3): qty 2

## 2021-05-03 MED ORDER — TETANUS-DIPHTH-ACELL PERTUSSIS 5-2.5-18.5 LF-MCG/0.5 IM SUSY: 0.5000 mL | PREFILLED_SYRINGE | Freq: Once | INTRAMUSCULAR | Status: DC

## 2021-05-03 MED ORDER — SCOPOLAMINE 1 MG/3DAYS TD PT72
1.0000 | MEDICATED_PATCH | Freq: Once | TRANSDERMAL | Status: DC
  Administered 2021-05-03: 1.5 mg via TRANSDERMAL

## 2021-05-03 MED ORDER — ZOLPIDEM TARTRATE 5 MG PO TABS: 5.0000 mg | ORAL_TABLET | Freq: Every evening | ORAL | Status: DC | PRN

## 2021-05-03 MED ORDER — OXYCODONE HCL 5 MG PO TABS
5.0000 mg | ORAL_TABLET | ORAL | Status: DC | PRN
  Administered 2021-05-03 – 2021-05-04 (×2): 5 mg via ORAL
  Filled 2021-05-03 (×2): qty 1

## 2021-05-03 MED ORDER — OXYCODONE HCL 5 MG PO TABS
5.0000 mg | ORAL_TABLET | Freq: Once | ORAL | Status: AC
  Administered 2021-05-03: 5 mg via ORAL
  Filled 2021-05-03: qty 1

## 2021-05-03 MED ORDER — LACTATED RINGERS IV SOLN: INTRAVENOUS | Status: AC

## 2021-05-03 MED ORDER — FENTANYL CITRATE (PF) 100 MCG/2ML IJ SOLN: 25.0000 ug | INTRAMUSCULAR | Status: DC | PRN

## 2021-05-03 MED ORDER — NALBUPHINE HCL 10 MG/ML IJ SOLN
5.0000 mg | Freq: Once | INTRAMUSCULAR | Status: AC | PRN
  Administered 2021-05-03: 5 mg via INTRAVENOUS

## 2021-05-03 MED ORDER — NALBUPHINE HCL 10 MG/ML IJ SOLN: 5.0000 mg | INTRAMUSCULAR | Status: DC | PRN

## 2021-05-03 MED ORDER — DIPHENHYDRAMINE HCL 25 MG PO CAPS: 25.0000 mg | ORAL_CAPSULE | Freq: Four times a day (QID) | ORAL | Status: DC | PRN

## 2021-05-03 MED ORDER — OXYTOCIN-SODIUM CHLORIDE 30-0.9 UT/500ML-% IV SOLN
INTRAVENOUS | Status: DC | PRN
  Administered 2021-05-03: 300 mL via INTRAVENOUS

## 2021-05-03 MED ORDER — MORPHINE SULFATE (PF) 0.5 MG/ML IJ SOLN
INTRAMUSCULAR | Status: DC | PRN
  Administered 2021-05-03: 3 mg via EPIDURAL

## 2021-05-03 MED ORDER — NALBUPHINE HCL 10 MG/ML IJ SOLN
5.0000 mg | Freq: Once | INTRAMUSCULAR | Status: AC | PRN
Start: 2021-05-03 — End: 2021-05-03

## 2021-05-03 MED ORDER — SCOPOLAMINE 1 MG/3DAYS TD PT72
MEDICATED_PATCH | TRANSDERMAL | Status: AC
  Filled 2021-05-03: qty 1

## 2021-05-03 MED ORDER — ONDANSETRON HCL 4 MG/2ML IJ SOLN: 4.0000 mg | Freq: Three times a day (TID) | INTRAMUSCULAR | Status: DC | PRN

## 2021-05-03 MED ORDER — DIPHENHYDRAMINE HCL 25 MG PO CAPS: 25.0000 mg | ORAL_CAPSULE | ORAL | Status: DC | PRN

## 2021-05-03 MED ORDER — SIMETHICONE 80 MG PO CHEW: 80.0000 mg | CHEWABLE_TABLET | ORAL | Status: DC | PRN

## 2021-05-03 MED ORDER — STERILE WATER FOR IRRIGATION IR SOLN
Status: DC | PRN
  Administered 2021-05-03: 1

## 2021-05-03 MED ORDER — SIMETHICONE 80 MG PO CHEW
80.0000 mg | CHEWABLE_TABLET | Freq: Three times a day (TID) | ORAL | Status: DC
  Administered 2021-05-03 – 2021-05-05 (×3): 80 mg via ORAL
  Filled 2021-05-03 (×3): qty 1

## 2021-05-03 MED ORDER — KETOROLAC TROMETHAMINE 30 MG/ML IJ SOLN
30.0000 mg | Freq: Four times a day (QID) | INTRAMUSCULAR | Status: AC
  Administered 2021-05-03: 30 mg via INTRAVENOUS
  Filled 2021-05-03 (×2): qty 1

## 2021-05-03 MED ORDER — DIBUCAINE (PERIANAL) 1 % EX OINT: 1.0000 "application " | TOPICAL_OINTMENT | CUTANEOUS | Status: DC | PRN

## 2021-05-03 MED ORDER — NALOXONE HCL 4 MG/10ML IJ SOLN
1.0000 ug/kg/h | INTRAVENOUS | Status: DC | PRN
  Filled 2021-05-03: qty 5

## 2021-05-03 MED ORDER — LACTATED RINGERS IV SOLN: INTRAVENOUS | Status: DC | PRN

## 2021-05-03 MED ORDER — PRENATAL MULTIVITAMIN CH
1.0000 | ORAL_TABLET | Freq: Every day | ORAL | Status: DC
  Administered 2021-05-04: 1 via ORAL
  Filled 2021-05-03: qty 1

## 2021-05-03 MED ORDER — PROMETHAZINE HCL 25 MG/ML IJ SOLN: 6.2500 mg | INTRAMUSCULAR | Status: DC | PRN

## 2021-05-03 MED ORDER — ONDANSETRON HCL 4 MG/2ML IJ SOLN
INTRAMUSCULAR | Status: DC | PRN
  Administered 2021-05-03: 4 mg via INTRAVENOUS

## 2021-05-03 MED ORDER — DEXAMETHASONE SODIUM PHOSPHATE 4 MG/ML IJ SOLN
INTRAMUSCULAR | Status: DC | PRN
  Administered 2021-05-03: 4 mg via INTRAVENOUS

## 2021-05-03 MED ORDER — SENNOSIDES-DOCUSATE SODIUM 8.6-50 MG PO TABS
2.0000 | ORAL_TABLET | Freq: Every day | ORAL | Status: DC
  Administered 2021-05-04: 2 via ORAL
  Filled 2021-05-03: qty 2

## 2021-05-03 NOTE — Discharge Instructions (Signed)
-  take tylenol 1000 mg every 6 hours as needed for pain, alternate with ibuprofen 600 mg every 6 hours -take oxycodone as needed if tylenol and ibuprofen aren't working -drink plenty of water to help with breastfeeding -continue prenatal vitamins while you are breastfeeding -take iron pills every other day with vitamin c, this will help healing as well as breast feeding -think about birth control options-->bedisider.org is a great website! You can get any form of birth control from the health department for free

## 2021-05-03 NOTE — Discharge Summary (Signed)
Postpartum Discharge Summary      Patient Name: Jessica Hansen DOB: 1996-04-14 MRN: [id number]  Date of admission: 05/02/2021 Delivery date:05/03/2021  Delivering provider: Arrie Senate  Date of discharge: 05/05/2021  Admitting diagnosis: Gestational hypertension [O13.9] Intrauterine pregnancy: [redacted]w[redacted]d     Secondary diagnosis:  Active Problems:   Supervision of normal first pregnancy, antepartum   Elevated hemoglobin A1c   Gestational hypertension   Positive GBS test   Chorioamnionitis   Cesarean delivery delivered  Additional problems: none    Discharge diagnosis: Term Pregnancy Delivered and Gestational Hypertension                                              Post partum procedures: none Augmentation: Pitocin, Cytotec, and IP Foley Complications: Intrauterine Inflammation or infection (Chorioamniotis)  Hospital course: Induction of Labor With Cesarean Section   25 y.o. yo G1P1001 at [redacted]w[redacted]d was admitted to the hospital 05/02/2021 for induction of labor due to gHTN (neg labs; mild range elevations). Patient had a labor course significant for recurrent late and variable decels, cat 2 strip, inability to increase pitocin. The patient went for cesarean section due to Non-Reassuring FHR. Delivery details are as follows: Membrane Rupture Time/Date: 8:12 PM ,05/02/2021   Delivery Method:C-Section, Vacuum Assisted  Details of operation can be found in separate operative Note.  Patient had a postpartum course remarkable for Hgb 11.6 on POD#1 (13.1 prior to admission) and mild range elevations on POD#2 with Procardia XL $RemoveBefo'30mg'qRRNGKnbLhD$  started prior to d/c. She is ambulating, tolerating a regular diet, passing flatus, and urinating well.  Patient is discharged home in stable condition on 05/05/21.      Newborn Data: Birth date:05/03/2021  Birth time:12:15 AM  Gender:Female  Living status:Living  Apgars:8 ,8  Weight:2805 g  (6lb 2.9oz)                              Magnesium Sulfate received:  No BMZ received: No Rhophylac:N/A MMR:N/A T-DaP:Given prenatally Flu: No Transfusion:No  Physical exam  Vitals:   05/03/21 2146 05/04/21 1443 05/04/21 2105 05/05/21 0529  BP: 112/69 124/68 134/90 133/88  Pulse: 92 71 84 78  Resp: $Remo'18 17 18 18  'zetKp$ Temp: 98.4 F (36.9 C) 97.8 F (36.6 C) 98.5 F (36.9 C) 98.4 F (36.9 C)  TempSrc: Oral Oral Oral Oral  SpO2: 98% 99% 96% 100%  Weight:      Height:       General: alert and cooperative Lochia: appropriate Uterine Fundus: firm Incision: honeycomb stained and unchanged DVT Evaluation: No evidence of DVT seen on physical exam. Labs: Lab Results  Component Value Date   WBC 13.0 (H) 05/03/2021   HGB 11.6 (L) 05/03/2021   HCT 33.7 (L) 05/03/2021   MCV 88.2 05/03/2021   PLT 207 05/03/2021   CMP Latest Ref Rng & Units 05/02/2021  Glucose 70 - 99 mg/dL 127(H)  BUN 6 - 20 mg/dL 5(L)  Creatinine 0.44 - 1.00 mg/dL 0.57  Sodium 135 - 145 mmol/L 133(L)  Potassium 3.5 - 5.1 mmol/L 3.7  Chloride 98 - 111 mmol/L 106  CO2 22 - 32 mmol/L 19(L)  Calcium 8.9 - 10.3 mg/dL 8.7(L)  Total Protein 6.5 - 8.1 g/dL 6.0(L)  Total Bilirubin 0.3 - 1.2 mg/dL 0.3  Alkaline Phos 38 - 126 U/L  139(H)  AST 15 - 41 U/L 19  ALT 0 - 44 U/L 10   Edinburgh Score: Edinburgh Postnatal Depression Scale Screening Tool 05/03/2021  I have been able to laugh and see the funny side of things. 0  I have looked forward with enjoyment to things. 0  I have blamed myself unnecessarily when things went wrong. 0  I have been anxious or worried for no good reason. 0  I have felt scared or panicky for no good reason. 0  Things have been getting on top of me. 1  I have been so unhappy that I have had difficulty sleeping. 0  I have felt sad or miserable. 0  I have been so unhappy that I have been crying. 0  The thought of harming myself has occurred to me. 0  Edinburgh Postnatal Depression Scale Total 1     After visit meds:  Allergies as of 05/05/2021       Reactions    Celery Oil Hives        Medication List     STOP taking these medications    acetaminophen 500 MG tablet Commonly known as: TYLENOL   butalbital-acetaminophen-caffeine 50-325-40 MG tablet Commonly known as: FIORICET   cyclobenzaprine 10 MG tablet Commonly known as: FLEXERIL   famotidine 20 MG tablet Commonly known as: PEPCID       TAKE these medications    ibuprofen 600 MG tablet Commonly known as: ADVIL Take 1 tablet (600 mg total) by mouth every 6 (six) hours as needed.   NIFEdipine 30 MG 24 hr tablet Commonly known as: ADALAT CC Take 1 tablet (30 mg total) by mouth daily.   oxyCODONE 5 MG immediate release tablet Commonly known as: Oxy IR/ROXICODONE Take 1-2 tablets (5-10 mg total) by mouth every 4 (four) hours as needed for moderate pain.   PRENATAL VITAMINS PO Take by mouth.         Discharge home in stable condition Infant Feeding: Bottle and Breast Infant Disposition:home with mother Discharge instruction: per After Visit Summary and Postpartum booklet. Activity: Advance as tolerated. Pelvic rest for 6 weeks.  Diet: routine diet Future Appointments: Future Appointments  Date Time Provider [place]  05/10/2021  9:30 AM Seabron Spates, CNM [facility] None  06/14/2021  9:35 AM Seabron Spates, CNM [facility] None   Follow up Visit: Message sent to [facility] 05/03/21 by Sylvester Harder.   Please schedule this patient for a In person postpartum visit in 6 weeks with the following provider: Any provider. Additional Postpartum F/U:Incision check 1 week and BP check 1 week  High risk pregnancy complicated by: HTN Delivery mode:  C-Section, Vacuum Assisted  Anticipated Birth Control:  POPs   05/05/2021 Myrtis Ser, CNM 11:15 AM

## 2021-05-03 NOTE — Lactation Note (Signed)
This note was copied from a baby's chart. Lactation Consultation Note  Patient Name: Jessica Hansen Date: 05/03/2021 Reason for consult: Initial assessment;Early term 37-38.6wks Age:26 hours Mother reports that she breastfed only once and she not wants to bottle feed formula . She doesn't want to breastfeed or pump and bottle.   Maternal Data Does the patient have breastfeeding experience prior to this delivery?: No  Feeding Mother's Current Feeding Choice: Breast Milk and Formula Nipple Type: Extra Slow Flow  LATCH Score                    Lactation Tools Discussed/Used    Interventions    Discharge    Consult Status Consult Status: Complete    Darla Lesches 05/03/2021, 9:22 AM

## 2021-05-03 NOTE — Transfer of Care (Signed)
Immediate Anesthesia Transfer of Care Note  Patient: Jessica Hansen  Procedure(s) Performed: CESAREAN SECTION  Patient Location: PACU  Anesthesia Type:Epidural  Level of Consciousness: awake, alert , oriented and patient cooperative  Airway & Oxygen Therapy: Patient Spontanous Breathing  Post-op Assessment: Report given to RN and Post -op Vital signs reviewed and stable  Post vital signs: Reviewed and stable  Last Vitals:  Vitals Value Taken Time  BP    Temp    Pulse    Resp    SpO2      Last Pain:  Vitals:   05/02/21 2337  TempSrc: Axillary  PainSc:          Complications: No notable events documented.

## 2021-05-03 NOTE — Anesthesia Postprocedure Evaluation (Signed)
Anesthesia Post Note  Patient: Jessica Hansen  Procedure(s) Performed: CESAREAN SECTION     Patient location during evaluation: PACU Anesthesia Type: Epidural Level of consciousness: awake and alert Pain management: pain level controlled Vital Signs Assessment: post-procedure vital signs reviewed and stable Respiratory status: spontaneous breathing, respiratory function stable and nonlabored ventilation Cardiovascular status: blood pressure returned to baseline Postop Assessment: epidural receding and no apparent nausea or vomiting Anesthetic complications: no   No notable events documented.  Last Vitals:  Vitals:   05/03/21 0130 05/03/21 0148  BP: 124/75 138/78  Pulse: (!) 103 (!) 108  Resp: (!) 21 (!) 24  Temp: 37.2 C   SpO2:      Last Pain:  Vitals:   05/03/21 0205  PainSc: 0-No pain   Pain Goal:    LLE Motor Response: Purposeful movement (05/03/21 0207) LLE Sensation: Tingling (05/03/21 0207) RLE Motor Response: Purposeful movement (05/03/21 0207) RLE Sensation: Tingling (05/03/21 0207)     Epidural/Spinal Function Cutaneous sensation: Tingles (05/03/21 0200), Patient able to flex knees: Yes (05/03/21 0200), Patient able to lift hips off bed: Yes (05/03/21 0200), Back pain beyond tenderness at insertion site: No (05/03/21 0200), Progressively worsening motor and/or sensory loss: No (05/03/21 0200), Bowel and/or bladder incontinence post epidural: No (05/03/21 0200)  Audry Pili

## 2021-05-03 NOTE — Anesthesia Postprocedure Evaluation (Signed)
Anesthesia Post Note  Patient: Jessica Hansen  Procedure(s) Performed: [place]     Patient location during evaluation: Mother Baby Anesthesia Type: Epidural Level of consciousness: awake and alert Pain management: pain level controlled Vital Signs Assessment: post-procedure vital signs reviewed and stable Respiratory status: spontaneous breathing, nonlabored ventilation and respiratory function stable Cardiovascular status: stable Postop Assessment: no headache, no backache and epidural receding Anesthetic complications: no   No notable events documented.  Last Vitals:  Vitals:   05/03/21 0432 05/03/21 0633  BP:  133/86  Pulse:  84  Resp:  18  Temp:  (!) 36.3 C  SpO2: 96% 96%    Last Pain:  Vitals:   05/03/21 0721  TempSrc:   PainSc: 6    Pain Goal:                   Jessica Hansen

## 2021-05-03 NOTE — Op Note (Signed)
Jessica Hansen PROCEDURE DATE: 05/03/2021  PREOPERATIVE DIAGNOSES: Intrauterine pregnancy at [redacted]w[redacted]d weeks gestation; non-reassuring fetal status  POSTOPERATIVE DIAGNOSES: The same  PROCEDURE: Primary Low Transverse Cesarean Section  SURGEON:  Dr. Janyth Pupa  ASSISTANT:  Dr. Corliss Blacker  ANESTHESIOLOGY TEAM: Anesthesiologist: Audry Pili, MD CRNA: Rhymer, Waynard Reeds, CRNA  INDICATIONS: Jessica Hansen is a 25 y.o. G1P1001 at [redacted]w[redacted]d here for cesarean section secondary to the indications listed under preoperative diagnoses; please see preoperative note for further details.  The risks of cesarean section were discussed with the patient including but were not limited to: bleeding which may require transfusion or reoperation; infection which may require antibiotics; injury to bowel, bladder, ureters or other surrounding organs; injury to the fetus; need for additional procedures including hysterectomy in the event of a life-threatening hemorrhage; placental abnormalities wth subsequent pregnancies, incisional problems, thromboembolic phenomenon and other postoperative/anesthesia complications.   The patient concurred with the proposed plan, giving informed written consent for the procedure.    FINDINGS:  Viable female infant in cephalic presentation.  Apgars 8 and 8.  Clear amniotic fluid.  Intact placenta, three vessel cord.  Normal uterus, fallopian tubes and ovaries bilaterally.  ANESTHESIA: Epidural  INTRAVENOUS FLUIDS: 600 ml   ESTIMATED BLOOD LOSS: 335 ml URINE OUTPUT:  100 ml clear fluid SPECIMENS: Placenta sent to pathology COMPLICATIONS: None immediate  PROCEDURE IN DETAIL:  The patient preoperatively received intravenous antibiotics and had sequential compression devices applied to her lower extremities.  She was then taken to the operating room where the epidural anesthesia was dosed up to surgical level and was found to be adequate. She was then placed in a dorsal supine  position with a leftward tilt, and prepped and draped in a sterile manner.  A foley catheter was already placed into her bladder and attached to constant gravity.  After an adequate timeout was performed, a Pfannenstiel skin incision was made with scalpel and carried through to the underlying layer of fascia. The fascia was incised in the midline, and this incision was extended bilaterally bluntly.  The rectus muscles were dissected off bluntly. The rectus muscles were separated in the midline and the peritoneum was entered bluntly. The Alexis self-retaining retractor was introduced into the abdominal cavity.  Attention was turned to the lower uterine segment where a low transverse hysterotomy was made with a scalpel and extended bilaterally bluntly.  Due to difficulty delivering the head a kiwi vacuum was applied and suction increased to 500 mmhg, with one pull and 0 popoffs the infant was successfully delivered, the cord was clamped and cut after one minute, and the infant was handed over to the awaiting neonatology team. Nuchal cord x1 noted at delivery, delivered through. Uterine massage was then administered, and the placenta delivered intact with a three-vessel cord. The uterus was then cleared of clots and debris.  The hysterotomy was closed with 0 Vicryl in a running locked fashion, and an imbricating layer was also placed with 0 Vicryl. Figure-of-eight 0 Vicryl serosal stitches were placed to help with hemostasis.  The pelvis was cleared of all clot and debris. Hemostasis was confirmed on all surfaces.  The retractor was removed.  The peritoneum was closed with a 2-0 Vicryl running stitch. The fascia was then closed using 0 Vicryl in a running fashion.  The subcutaneous layer was irrigated, and the skin was closed with a 4-0 Vicryl subcuticular stitch. The patient tolerated the procedure well. Sponge, instrument and needle counts were correct x 3.  She  was taken to the recovery room in stable condition.    Arrie Senate, MD OB Fellow, [place] for [place] 05/03/2021 1:03 AM

## 2021-05-03 NOTE — Progress Notes (Signed)
Risk benefits and alternatives of cesarean section were discussed with the patient including but not limited to infection, bleeding, damage to bowel , bladder and baby with the need for further surgery. Pt voiced understanding and desires to proceed due to fetal intolerance to labor.  Janyth Pupa, DO Attending [place], [facility] for [employer], [place]

## 2021-05-03 NOTE — Addendum Note (Signed)
Addendum  created 05/03/21 0800 by Ignacia Bayley, CRNA   Clinical Note Signed

## 2021-05-04 LAB — SURGICAL PATHOLOGY

## 2021-05-04 NOTE — Progress Notes (Signed)
POSTPARTUM PROGRESS NOTE  Subjective: Jessica Hansen is a 25 y.o. G1P1001 s/p pLTCS at [redacted]w[redacted]d.  She reports she doing well. No acute events overnight. She denies any problems with ambulating, voiding or po intake. Denies nausea or vomiting. She has passed flatus. Pain is well controlled.  Lochia is minimal.  Objective: Blood pressure 112/69, pulse 92, temperature 98.4 F (36.9 C), temperature source Oral, resp. rate 18, height 5\' 3"  (1.6 m), weight 91.4 kg, last menstrual period 08/16/2020, SpO2 98 %, unknown if currently breastfeeding.  Physical Exam:  General: alert, cooperative and no distress Chest: no respiratory distress Abdomen: soft, non-tender. Dressing with minimal dried blood; otherwise clean and dry. Uterine Fundus: firm and at level of umbilicus Extremities: No calf swelling or tenderness  no LE edema  Recent Labs    05/03/21 0127 05/03/21 0552  HGB 11.7* 11.6*  HCT 34.4* 33.7*    Assessment/Plan: Jessica Hansen is a 25 y.o. G1P1001 s/p pLTCS at [redacted]w[redacted]d secondary to failed IOL.  Routine Postpartum Care: Doing well, pain well-controlled.  -- Continue routine care, lactation support  -- Contraception: plan for POPs -- Feeding: breast -- gHTN: blood pressures wnl s/p delivery. No concerning signs/symptoms. Will continue to monitor.  Dispo: Plan for discharge POD#2-3.  Randa Ngo, MD OB Fellow, [facility] 05/04/2021 10:45 AM

## 2021-05-05 ENCOUNTER — Other Ambulatory Visit (HOSPITAL_COMMUNITY): Payer: Self-pay

## 2021-05-05 ENCOUNTER — Inpatient Hospital Stay (HOSPITAL_COMMUNITY): Payer: Medicaid Other

## 2021-05-05 ENCOUNTER — Inpatient Hospital Stay (HOSPITAL_COMMUNITY)
Admission: AD | Admit: 2021-05-05 | Payer: Medicaid Other | Source: Home / Self Care | Admitting: Obstetrics & Gynecology

## 2021-05-05 MED ORDER — IBUPROFEN 600 MG PO TABS
600.0000 mg | ORAL_TABLET | Freq: Four times a day (QID) | ORAL | 1 refills | Status: DC | PRN
  Filled 2021-05-05: qty 30, 8d supply, fill #0

## 2021-05-05 MED ORDER — NIFEDIPINE ER 30 MG PO TB24
30.0000 mg | ORAL_TABLET | Freq: Every day | ORAL | 2 refills | Status: DC
  Filled 2021-05-05: qty 30, 30d supply, fill #0

## 2021-05-05 MED ORDER — OXYCODONE HCL 5 MG PO TABS
5.0000 mg | ORAL_TABLET | ORAL | 0 refills | Status: DC | PRN
  Filled 2021-05-05: qty 30, 3d supply, fill #0

## 2021-05-05 MED ORDER — NIFEDIPINE ER OSMOTIC RELEASE 30 MG PO TB24: 30.0000 mg | ORAL_TABLET | Freq: Every day | ORAL | Status: DC

## 2021-05-10 ENCOUNTER — Other Ambulatory Visit: Payer: Self-pay

## 2021-05-10 ENCOUNTER — Encounter: Payer: Medicaid Other | Admitting: Advanced Practice Midwife

## 2021-05-10 ENCOUNTER — Ambulatory Visit (INDEPENDENT_AMBULATORY_CARE_PROVIDER_SITE_OTHER): Payer: Medicaid Other | Admitting: Advanced Practice Midwife

## 2021-05-10 ENCOUNTER — Encounter: Payer: Self-pay | Admitting: Advanced Practice Midwife

## 2021-05-10 VITALS — BP 139/97 | HR 68 | Wt 177.0 lb

## 2021-05-10 DIAGNOSIS — O133 Gestational [pregnancy-induced] hypertension without significant proteinuria, third trimester: Secondary | ICD-10-CM

## 2021-05-10 MED ORDER — AMLODIPINE BESYLATE 2.5 MG PO TABS: 2.5000 mg | ORAL_TABLET | Freq: Every day | ORAL | 1 refills | Status: DC

## 2021-05-10 NOTE — Progress Notes (Signed)
   Subjective:    Patient ID: Jessica Hansen, female    DOB: Mar 26, 1996, 26 y.o.   MRN: [id number]  Abdominal Pain This is a recurrent problem. The current episode started in the past 7 days. The problem has been gradually improving. Pain location: Incision. Pertinent negatives include no constipation, diarrhea, dysuria, fever or myalgias. The pain is aggravated by coughing and certain positions. Relieved by: medications.  Cesarean Section on 05/03/21 Has done well since then Using Percocet and ibuprofen for pain   Review of Systems  Constitutional:  Negative for fever.  Gastrointestinal:  Positive for abdominal pain. Negative for constipation and diarrhea.  Genitourinary:  Negative for dysuria.  Musculoskeletal:  Negative for myalgias.      Objective:   Physical Exam Constitutional:      General: She is not in acute distress.    Appearance: Normal appearance. She is not ill-appearing or toxic-appearing.  Cardiovascular:     Rate and Rhythm: Normal rate.  Pulmonary:     Effort: Pulmonary effort is normal.  Abdominal:     General: There is no distension.     Tenderness: There is abdominal tenderness (appropriately tender over incision, incision well healed). There is no guarding.     Hernia: No hernia is present.  Musculoskeletal:        General: Normal range of motion.  Skin:    General: Skin is warm and dry.  Neurological:     General: No focal deficit present.     Mental Status: She is alert.  Psychiatric:        Mood and Affect: Mood normal.   Vitals:   05/10/21 0954 05/10/21 0957  BP: (!) 133/100 (!) 139/97  Pulse: 69 68  Weight: 177 lb (80.3 kg)           Assessment & Plan:  Postop Day # 7 Incision healing well Postpartum Hypertension  Will start her on small dose of Norvasc siince her BPs were normal immediately postpartum but increased today (likely due to fluid shifting).  Recheck BP next week to see if we need to adjust dose PPvisit in 3-4 wks

## 2021-05-12 ENCOUNTER — Encounter: Payer: Self-pay | Admitting: Family Medicine

## 2021-05-13 ENCOUNTER — Emergency Department (HOSPITAL_COMMUNITY)
Admission: EM | Admit: 2021-05-13 | Discharge: 2021-05-14 | Disposition: A | Discharge status: Home / Self Care | Payer: Medicaid Other | Attending: Emergency Medicine | Admitting: Emergency Medicine

## 2021-05-13 ENCOUNTER — Other Ambulatory Visit: Payer: Self-pay

## 2021-05-13 ENCOUNTER — Encounter (HOSPITAL_COMMUNITY): Payer: Self-pay

## 2021-05-13 DIAGNOSIS — U071 COVID-19: Secondary | ICD-10-CM | POA: Insufficient documentation

## 2021-05-13 DIAGNOSIS — J45909 Unspecified asthma, uncomplicated: Secondary | ICD-10-CM | POA: Insufficient documentation

## 2021-05-13 DIAGNOSIS — F322 Major depressive disorder, single episode, severe without psychotic features: Secondary | ICD-10-CM | POA: Insufficient documentation

## 2021-05-13 DIAGNOSIS — T391X2A Poisoning by 4-Aminophenol derivatives, intentional self-harm, initial encounter: Secondary | ICD-10-CM | POA: Insufficient documentation

## 2021-05-13 DIAGNOSIS — X58XXXA Exposure to other specified factors, initial encounter: Secondary | ICD-10-CM | POA: Insufficient documentation

## 2021-05-13 DIAGNOSIS — F4320 Adjustment disorder, unspecified: Secondary | ICD-10-CM | POA: Diagnosis present

## 2021-05-13 DIAGNOSIS — T1491XA Suicide attempt, initial encounter: Secondary | ICD-10-CM | POA: Insufficient documentation

## 2021-05-13 LAB — I-STAT BETA HCG BLOOD, ED (MC, WL, AP ONLY): I-stat hCG, quantitative: <5 m[IU]/mL (ref <5)

## 2021-05-13 LAB — CBC WITH DIFFERENTIAL/PLATELET
Abs Immature Granulocytes: 0.02 10*3/uL (ref 0.00–0.07)
Basophils Absolute: 0 10*3/uL (ref 0.0–0.1)
Basophils Relative: 0 %
Eosinophils Absolute: 0.1 10*3/uL (ref 0.0–0.5)
Eosinophils Relative: 1 %
HCT: 43 % (ref 36.0–46.0)
Hemoglobin: 14.8 g/dL (ref 12.0–15.0)
Immature Granulocytes: 0 %
Lymphocytes Relative: 27 %
Lymphs Abs: 1.9 10*3/uL (ref 0.7–4.0)
MCH: 31.4 pg (ref 26.0–34.0)
MCHC: 34.4 g/dL (ref 30.0–36.0)
MCV: 91.1 fL (ref 80.0–100.0)
Monocytes Absolute: 0.5 10*3/uL (ref 0.1–1.0)
Monocytes Relative: 7 %
Neutro Abs: 4.7 10*3/uL (ref 1.7–7.7)
Neutrophils Relative %: 65 %
Platelets: 303 10*3/uL (ref 150–400)
RBC: 4.72 MIL/uL (ref 3.87–5.11)
RDW: 11.9 % (ref 11.5–15.5)
WBC: 7.3 10*3/uL (ref 4.0–10.5)
nRBC: 0 % (ref 0.0–0.2)

## 2021-05-13 LAB — COMPREHENSIVE METABOLIC PANEL
ALT: 32 U/L (ref 0–44)
AST: 25 U/L (ref 15–41)
Albumin: 4.1 g/dL (ref 3.5–5.0)
Alkaline Phosphatase: 62 U/L (ref 38–126)
Anion gap: 11 (ref 5–15)
BUN: 8 mg/dL (ref 6–20)
CO2: 22 mmol/L (ref 22–32)
Calcium: 9.2 mg/dL (ref 8.9–10.3)
Chloride: 105 mmol/L (ref 98–111)
Creatinine, Ser: 0.72 mg/dL (ref 0.44–1.00)
GFR, Estimated: >60 mL/min (ref >60)
Glucose, Bld: 99 mg/dL (ref 70–99)
Potassium: 4 mmol/L (ref 3.5–5.1)
Sodium: 138 mmol/L (ref 135–145)
Total Bilirubin: 1 mg/dL (ref 0.3–1.2)
Total Protein: 7.7 g/dL (ref 6.5–8.1)

## 2021-05-13 LAB — URINALYSIS, ROUTINE W REFLEX MICROSCOPIC
Bilirubin Urine: NEGATIVE
Glucose, UA: NEGATIVE mg/dL
Hgb urine dipstick: NEGATIVE
Ketones, ur: 20 mg/dL — AB
Nitrite: NEGATIVE
Protein, ur: NEGATIVE mg/dL
Specific Gravity, Urine: 1.026 (ref 1.005–1.030)
pH: 5 (ref 5.0–8.0)

## 2021-05-13 LAB — ETHANOL: Alcohol, Ethyl (B): <10 mg/dL (ref <10)

## 2021-05-13 LAB — ACETAMINOPHEN LEVEL
Acetaminophen (Tylenol), Serum: 56 ug/mL — ABNORMAL HIGH (ref 10–30)
Acetaminophen (Tylenol), Serum: 11 ug/mL (ref 10–30)

## 2021-05-13 LAB — RAPID URINE DRUG SCREEN, HOSP PERFORMED
Amphetamines: NOT DETECTED
Barbiturates: NOT DETECTED
Benzodiazepines: NOT DETECTED
Cocaine: NOT DETECTED
Opiates: NOT DETECTED
Tetrahydrocannabinol: POSITIVE — AB

## 2021-05-13 LAB — RESP PANEL BY RT-PCR (FLU A&B, COVID) ARPGX2
Influenza A by PCR: NEGATIVE
Influenza B by PCR: NEGATIVE
SARS Coronavirus 2 by RT PCR: POSITIVE — AB

## 2021-05-13 LAB — SALICYLATE LEVEL: Salicylate Lvl: <7 mg/dL — ABNORMAL LOW (ref 7.0–30.0)

## 2021-05-13 LAB — MAGNESIUM: Magnesium: 1.7 mg/dL (ref 1.7–2.4)

## 2021-05-13 MED ORDER — MAGNESIUM SULFATE 2 GM/50ML IV SOLN
2.0000 g | Freq: Once | INTRAVENOUS | Status: AC
  Administered 2021-05-13: 2 g via INTRAVENOUS
  Filled 2021-05-13: qty 50

## 2021-05-13 NOTE — ED Notes (Signed)
Per Ala Dach from [employer] pt will be reassessed in the morning, qualifies for inpatient treatment but cannot be placed due to positive covid status

## 2021-05-13 NOTE — ED Notes (Signed)
Pt provided with personal cell phone to make phone call. Pt aware of time limit of 5 minutes to use phone. Pt verbalizes understanding.

## 2021-05-13 NOTE — ED Provider Notes (Signed)
Patient is a 25 year old female presenting for suicidal attempt with Tylenol at 10:30 AM this morning.  No prior attempts.  Care was taken over from the [facility] at sign off.  Please see his note for full detailed history.   Physical Exam  BP 109/71   Pulse 72   Temp 98.5 F (36.9 C) (Oral)   Resp 19   Ht 5\' 5"  (1.651 m)   Wt 84.4 kg   LMP  (LMP Unknown)   SpO2 99%   BMI 30.95 kg/m   Physical Exam Vitals and nursing note reviewed. Exam conducted with a chaperone present.  Constitutional:      General: She is not in acute distress.    Appearance: Normal appearance.  HENT:     Head: Normocephalic and atraumatic.  Eyes:     General: No scleral icterus.    Extraocular Movements: Extraocular movements intact.     Pupils: Pupils are equal, round, and reactive to light.  Skin:    Coloration: Skin is not jaundiced.  Neurological:     Mental Status: She is alert. Mental status is at baseline.     Coordination: Coordination normal.    ED Course/Procedures   Clinical Course as of 05/13/21 1816  Fri May 13, 2021  1230 Patient discussed with [place] poison control.  Recommend basic labs, EKG.  Recommend watching for EKG changes as well as possible seizure activity.  Also recommended acetaminophen level at 3:30 PM.  Observe in the ED until asymptomatic. [LJ]  1402 QT is mildly prolonged.  Patient's magnesium is the lower limit of normal.  We will give IV magnesium. [LJ]  1402 Acetaminophen (Tylenol), S(!): 56 Patient ingested the Tylenol/Benadryl around 10:30 AM.  This lab result is about 2 hours post ingestion.  Will obtain additional acetaminophen at 4 hours postingestion. [LJ]  [facility] Patient does have findings relatively consistent with a UTI.  However, based on current guidelines because she is nitrite negative without any urinary tract infection symptoms this does not need abx treatment.  [HS]    Clinical Course User Index [HS] [id number], PA-C [LJ] Theron Arista,  PA-C    Procedures  MDM  Patient is asymptomatic at this time.  She is depressed, and here under IVC.  Her second Tylenol level came back and is not elevated to the point of needing NAC.  Her QTC is improved from initial EKG done at 2 hours postingestion.  Unfortunately, she is COVID-positive.  Based on work-up she, she is medically cleared at this point.  I will place TTS consult.       Placido Sou, PA-C 05/13/21 1818    Little, 05/15/21, MD 05/14/21 [phone]

## 2021-05-13 NOTE — ED Notes (Signed)
Dinner tray provided to pt at this time.

## 2021-05-13 NOTE — ED Notes (Signed)
Sitter remains at the bedside.  The patient keeps her eyes closed most of the time and does not want to talk about the events of today.

## 2021-05-13 NOTE — ED Notes (Signed)
Pt ambulated to BR with steady gait. Pt aware of needed urine sample and given urine specimen cup for same.

## 2021-05-13 NOTE — ED Notes (Signed)
Lab called to add on urine culture previously sent.

## 2021-05-13 NOTE — ED Notes (Signed)
[name], poison control updated on pt status and results at this time. Poison control signed off on pt at this time.

## 2021-05-13 NOTE — BH Assessment (Signed)
Comprehensive Clinical Assessment (CCA) Note  05/13/2021 Teresa Harrington [id number]  DISPOSITION: Gave clinical report to Roselyn Bering, NP who determined Pt meets criteria for inpatient psychiatric treatment. Pt tests positive for COVID and will be observed in the ED and psychiatry will see Pt tomorrow. Notified Theron Arista, PA-C, Dr Frederick Peers, and Glenard Haring, RN of recommendation.  The patient demonstrates the following risk factors for suicide: Chronic risk factors for suicide include: N/A. Acute risk factors for suicide include: family or marital conflict. Protective factors for this patient include: responsibility to others (children, family). Considering these factors, the overall suicide risk at this point appears to be high. Patient is not appropriate for outpatient follow up.  Flowsheet Row ED from 05/13/2021 in [facility] ED from 04/21/2021 in [facility] RISK CATEGORY High Risk Error: Question 6 not populated       Pt is a 25 year old single female who presents unaccompanied to [facility] ED via EMS after intentionally ingesting 10-12 tabs of Tylenol PM in a suicide attempt. Pt says she was upset because her boyfriend went to see the mother of his son and was gone longer than expected. Pt says this led to a heated argument. Pt states she attempted suicide in response to this situation. Pt denies any history of suicide attempts. She describes her mood recently as "fairly happy." She denies problems with sleep. She denies changes in appetite. She denies depressive symptoms. Pt does report she has experienced anxiety and "anxiety attacks" for the past two weeks. Pt denies any history of intentional self-injurious behaviors. Pt denies current homicidal ideation or history of violence. Pt denies any history of auditory or visual hallucinations. Pt denies history of alcohol or other substance use, however Pt's urine drug  screen is positive for cannabis.  Pt identifies conflict with her boyfriend as her primary stressor. She says she lives alone with her two year old daughter. She says the child's father is currently caring for her. Pt reports she works in a [employer] trade [employer] and does not describe the job as stressful. She does report financial stress from paying bills. She identifies her parents and her five siblings as her primary support. She says her twin sister attempted suicide in the past. Pt denies history of abuse or trauma. She denies legal problems. She denies access to firearms. Pt denies any history of inpatient or outpatient mental health or substance abuse treatment.  Pt does not identify anyone to contact for collateral information.  Per medical record, Pt has been petitioned for IVC by EDP.  Pt is dressed in hospital scrubs, alert and oriented x4. Pt speaks in a clear tone, at moderate volume and normal pace. Motor behavior appears normal. Eye contact is good. Pt's mood is euthymic and affect is congruent with mood. Thought process is coherent and relevant. There is no indication Pt is currently responding to internal stimuli or experiencing delusional thought content. Pt was calm and cooperative throughout assessment. She says she does not want to be psychiatrically hospitalize, stating she needs to care for her daughter.   Chief Complaint:  Chief Complaint  Patient presents with   Suicide Attempt   Visit Diagnosis: F32.2 Major depressive disorder, Single episode, Severe   CCA Screening, Triage and Referral (STR)  Patient Reported Information How did you hear about [place]? No data recorded Referral name: No data recorded Referral phone number: No data recorded  Whom do you see for routine  medical problems? No data recorded Practice/Facility Name: No data recorded Practice/Facility Phone Number: No data recorded Name of Contact: No data recorded Contact Number: No data recorded Contact  Fax Number: No data recorded Prescriber Name: No data recorded Prescriber Address (if known): No data recorded  What Is the Reason for Your Visit/Call Today? No data recorded How Long Has This Been Causing You Problems? No data recorded What Do You Feel Would Help You the Most Today? No data recorded  Have You Recently Been in Any Inpatient Treatment (Hospital/Detox/Crisis Center/28-Day Program)? No data recorded Name/Location of Program/Hospital:No data recorded How Long Were You There? No data recorded When Were You Discharged? No data recorded  Have You Ever Received Services From [facility] Before? No data recorded Who Do You See at [facility]? No data recorded  Have You Recently Had Any Thoughts About Hurting Yourself? No data recorded Are You Planning to Commit Suicide/Harm Yourself At This time? No data recorded  Have you Recently Had Thoughts About Hurting Someone Karolee Ohs? No data recorded Explanation: No data recorded  Have You Used Any Alcohol or Drugs in the Past 24 Hours? No data recorded How Long Ago Did You Use Drugs or Alcohol? No data recorded What Did You Use and How Much? No data recorded  Do You Currently Have a Therapist/Psychiatrist? No data recorded Name of Therapist/Psychiatrist: No data recorded  Have You Been Recently Discharged From Any Office Practice or Programs? No data recorded Explanation of Discharge From Practice/Program: No data recorded    CCA Screening Triage Referral Assessment Type of Contact: No data recorded Is this Initial or Reassessment? No data recorded Date Telepsych consult ordered in [facility]:  No data recorded Time Telepsych consult ordered in [facility]:  No data recorded  Patient Reported Information Reviewed? No data recorded Patient Left Without Being Seen? No data recorded Reason for Not Completing Assessment: No data recorded  Collateral Involvement: No data recorded  Does Patient Have a Court Appointed Legal Guardian? No data  recorded Name and Contact of Legal Guardian: No data recorded If Minor and Not Living with Parent(s), Who has Custody? No data recorded Is CPS involved or ever been involved? No data recorded Is APS involved or ever been involved? No data recorded  Patient Determined To Be At Risk for Harm To Self or Others Based on Review of Patient Reported Information or Presenting Complaint? No data recorded Method: No data recorded Availability of Means: No data recorded Intent: No data recorded Notification Required: No data recorded Additional Information for Danger to Others Potential: No data recorded Additional Comments for Danger to Others Potential: No data recorded Are There Guns or Other Weapons in Your Home? No data recorded Types of Guns/Weapons: No data recorded Are These Weapons Safely Secured?                            No data recorded Who Could Verify You Are Able To Have These Secured: No data recorded Do You Have any Outstanding Charges, Pending Court Dates, Parole/Probation? No data recorded Contacted To Inform of Risk of Harm To Self or Others: No data recorded  Location of Assessment: No data recorded  Does Patient Present under Involuntary Commitment? No data recorded IVC Papers Initial File Date: No data recorded  [place] of Residence: No data recorded  Patient Currently Receiving the Following Services: No data recorded  Determination of Need: No data recorded  Options For Referral: No data  recorded    CCA Biopsychosocial Intake/Chief Complaint:  No data recorded Current Symptoms/Problems: No data recorded  Patient Reported Schizophrenia/Schizoaffective Diagnosis in Past: No   Strengths: Pt has good family support.  Preferences: No data recorded Abilities: No data recorded  Type of Services Patient Feels are Needed: No data recorded  Initial Clinical Notes/Concerns: No data recorded  Mental Health Symptoms Depression:   Irritability   Duration of  Depressive symptoms:  Greater than two weeks   Mania:   None   Anxiety:    Tension; Worrying   Psychosis:   None   Duration of Psychotic symptoms: No data recorded  Trauma:   None   Obsessions:   None   Compulsions:   None   Inattention:   None   Hyperactivity/Impulsivity:   None   Oppositional/Defiant Behaviors:   N/A   Emotional Irregularity:   None   Other Mood/Personality Symptoms:   NA    Mental Status Exam Appearance and self-care  Stature:   Average   Weight:   Overweight   Clothing:   -- (Scrubs)   Grooming:   Normal   Cosmetic use:   Age appropriate   Posture/gait:   Normal   Motor activity:   Not Remarkable   Sensorium  Attention:   Normal   Concentration:   Normal   Orientation:   X5   Recall/memory:   Normal   Affect and Mood  Affect:   Appropriate   Mood:   Euthymic   Relating  Eye contact:   Normal   Facial expression:   Responsive   Attitude toward examiner:   Cooperative   Thought and Language  Speech flow:  Clear and Coherent   Thought content:   Appropriate to Mood and Circumstances   Preoccupation:   None   Hallucinations:   None   Organization:  No data recorded  [employer] of Knowledge:   Average   Intelligence:   Average   Abstraction:   Normal   Judgement:   Fair   Dance movement psychotherapist:   Realistic   Insight:   Gaps   Decision Making:   Impulsive   Social Functioning  Social Maturity:   Responsible   Social Judgement:   Normal   Stress  Stressors:   Relationship; Financial   Coping Ability:   Overwhelmed   Skill Deficits:   None   Supports:   Family     Religion: Religion/Spirituality Are You A Religious Person?: No How Might This Affect Treatment?: NA  Leisure/Recreation: Leisure / Recreation Do You Have Hobbies?: Yes Leisure and Hobbies: Baking  Exercise/Diet: Exercise/Diet Do You Exercise?: Yes What Type of Exercise Do  You Do?: Weight Training How Many Times a Week Do You Exercise?: 4-5 times a week Have You Gained or Lost A Significant Amount of Weight in the Past Six Months?: No Do You Follow a Special Diet?: No Do You Have Any Trouble Sleeping?: No   CCA Employment/Education Employment/Work Situation: Employment / Work Situation Employment Situation: Employed Work Stressors: Pt denies work stress. Patient's Job has Been Impacted by Current Illness: No Has Patient ever Been in the [employer]?: No  Education: Education Is Patient Currently Attending School?: No Last Grade Completed: 12 Did You Attend College?: No Did You Have An Individualized Education Program (IIEP): No Did You Have Any Difficulty At School?: No Patient's Education Has Been Impacted by Current Illness: No   CCA Family/Childhood History Family and Relationship History: Family history Marital  status: Single Does patient have children?: Yes How many children?: 1 How is patient's relationship with their children?: Good relationship with 26 year old daughter.  Childhood History:  Childhood History By whom was/is the patient raised?: Both parents Did patient suffer any verbal/emotional/physical/sexual abuse as a child?: No Did patient suffer from severe childhood neglect?: No Has patient ever been sexually abused/assaulted/raped as an adolescent or adult?: No Was the patient ever a victim of a crime or a disaster?: No Witnessed domestic violence?: No Has patient been affected by domestic violence as an adult?: No  Child/Adolescent Assessment:     CCA Substance Use Alcohol/Drug Use:                           ASAM's:  Six Dimensions of Multidimensional Assessment  Dimension 1:  Acute Intoxication and/or Withdrawal Potential:      Dimension 2:  Biomedical Conditions and Complications:      Dimension 3:  Emotional, Behavioral, or Cognitive Conditions and Complications:     Dimension 4:  Readiness to  Change:     Dimension 5:  Relapse, Continued use, or Continued Problem Potential:     Dimension 6:  Recovery/Living Environment:     ASAM Severity Score:    ASAM Recommended Level of Treatment:     Substance use Disorder (SUD)    Recommendations for Services/Supports/Treatments:    DSM5 Diagnoses: Patient Active Problem List   Diagnosis Date Noted   VISUAL ACUITY, DECREASED, RIGHT EYE 12/27/2007   ASTHMA, UNSPECIFIED 01/17/2007    Patient Centered Plan: Patient is on the following Treatment Plan(s):  Depression   Referrals to Alternative Service(s): Referred to Alternative Service(s):   Place:   Date:   Time:    Referred to Alternative Service(s):   Place:   Date:   Time:    Referred to Alternative Service(s):   Place:   Date:   Time:    Referred to Alternative Service(s):   Place:   Date:   Time:     Pamalee LeydenWarrick Jr, Sharmane Dame Ellis, [facility]

## 2021-05-13 NOTE — ED Provider Notes (Signed)
[facility]-EMERGENCY DEPT Provider Note   CSN: [id number] Arrival date & time: 05/13/21  1153     History Chief Complaint  Patient presents with   Suicide Attempt    Teresa Harrington is a 25 y.o. female.  HPI Patient is a 25 year old female with a medical history as noted below.  She presents to the emergency department due to what appears to be a suicide attempt.  Patient states that she recently broke up with her boyfriend and they had a heated argument.  Afterwards she became depressed and took 10-20 Tylenol PMs.  She states that she is very fatigued but otherwise has no complaints.  Denies any numbness, nausea, vomiting, chest pain, shortness of breath, abdominal pain.  Denies a history of previous suicide attempts.  Denies any homicidal ideation.  No visual or auditory hallucinations.    Past Medical History:  Diagnosis Date   Medical history non-contributory     Patient Active Problem List   Diagnosis Date Noted   VISUAL ACUITY, DECREASED, RIGHT EYE 12/27/2007   ASTHMA, UNSPECIFIED 01/17/2007    Past Surgical History:  Procedure Laterality Date   NO PAST SURGERIES       OB History     Gravida  1   Para  1   Term  1   Preterm      AB      Living  1      SAB      IAB      Ectopic      Multiple  0   Live Births  1           Family History  Problem Relation Age of Onset   Healthy Mother    Healthy Father     Social History   Tobacco Use   Smoking status: Never   Smokeless tobacco: Never  Vaping Use   Vaping Use: Never used  Substance Use Topics   Alcohol use: No   Drug use: No    Home Medications Prior to Admission medications   Medication Sig Start Date End Date Taking? Authorizing Provider  metroNIDAZOLE (FLAGYL) 500 MG tablet Take 1 tablet (500 mg total) by mouth 2 (two) times daily. 04/22/21   Merrilee Jansky, MD  naproxen (NAPROSYN) 500 MG tablet Take 1 tablet (500 mg total) by mouth 2 (two) times  daily with a meal. 04/21/21   Wallis Bamberg, PA-C    Allergies    Tramadol and Other  Review of Systems   Review of Systems  All other systems reviewed and are negative. Ten systems reviewed and are negative for acute change, except as noted in the HPI.   Physical Exam Updated Vital Signs BP 113/68   Pulse 72   Temp 98.5 F (36.9 C) (Oral)   Resp 18   Ht 5\' 5"  (1.651 m)   Wt 84.4 kg   LMP  (LMP Unknown)   SpO2 100%   BMI 30.95 kg/m   Physical Exam Vitals and nursing note reviewed.  Constitutional:      General: She is not in acute distress.    Appearance: Normal appearance. She is not ill-appearing, toxic-appearing or diaphoretic.  HENT:     Head: Normocephalic and atraumatic.     Right Ear: External ear normal.     Left Ear: External ear normal.     Nose: Nose normal.     Mouth/Throat:     Mouth: Mucous membranes are moist.  Pharynx: Oropharynx is clear. No oropharyngeal exudate or posterior oropharyngeal erythema.  Eyes:     General: No scleral icterus.       Right eye: No discharge.        Left eye: No discharge.     Extraocular Movements: Extraocular movements intact.     Conjunctiva/sclera: Conjunctivae normal.     Pupils: Pupils are equal, round, and reactive to light.     Comments: PERRL. EOMI.  Cardiovascular:     Rate and Rhythm: Normal rate and regular rhythm.     Pulses: Normal pulses.     Heart sounds: Normal heart sounds. No murmur heard.   No friction rub. No gallop.  Pulmonary:     Effort: Pulmonary effort is normal. No respiratory distress.     Breath sounds: Normal breath sounds. No stridor. No wheezing, rhonchi or rales.  Abdominal:     General: Abdomen is flat.     Tenderness: There is no abdominal tenderness.  Musculoskeletal:        General: Normal range of motion.     Cervical back: Normal range of motion and neck supple. No tenderness.  Skin:    General: Skin is warm and dry.  Neurological:     General: No focal deficit present.      Mental Status: She is alert and oriented to person, place, and time.     Comments: Fatigued appearing but A&O x3.  Speaking clearly.  Moving all 4 extremities with ease.  No gross deficits.  Psychiatric:        Mood and Affect: Mood is depressed.        Speech: Speech is slurred.        Behavior: Behavior normal. Behavior is not agitated or aggressive. Behavior is cooperative.        Thought Content: Thought content is not paranoid or delusional. Thought content includes suicidal ideation. Thought content does not include homicidal ideation. Thought content includes suicidal plan. Thought content does not include homicidal plan.    ED Results / Procedures / Treatments   Labs (all labs ordered are listed, but only abnormal results are displayed) Labs Reviewed  RESP PANEL BY RT-PCR (FLU A&B, COVID) ARPGX2 - Abnormal; Notable for the following components:      Result Value   SARS Coronavirus 2 by RT PCR POSITIVE (*)    All other components within normal limits  SALICYLATE LEVEL - Abnormal; Notable for the following components:   Salicylate Lvl <7.0 (*)    All other components within normal limits  URINALYSIS, ROUTINE W REFLEX MICROSCOPIC - Abnormal; Notable for the following components:   APPearance HAZY (*)    Ketones, ur 20 (*)    Leukocytes,Ua LARGE (*)    Bacteria, UA RARE (*)    All other components within normal limits  RAPID URINE DRUG SCREEN, HOSP PERFORMED - Abnormal; Notable for the following components:   Tetrahydrocannabinol POSITIVE (*)    All other components within normal limits  ACETAMINOPHEN LEVEL - Abnormal; Notable for the following components:   Acetaminophen (Tylenol), Serum 56 (*)    All other components within normal limits  COMPREHENSIVE METABOLIC PANEL  CBC WITH DIFFERENTIAL/PLATELET  MAGNESIUM  ETHANOL  ACETAMINOPHEN LEVEL  I-STAT BETA HCG BLOOD, ED ([facility], [facility], [facility] ONLY)   EKG None  Radiology No results found.  Procedures Procedures   Medications  Ordered in ED Medications  magnesium sulfate IVPB 2 g 50 mL (0 g Intravenous Stopped 05/13/21 1532)  ED Course  I have reviewed the triage vital signs and the nursing notes.  Pertinent labs & imaging results that were available during my care of the patient were reviewed by me and considered in my medical decision making (see chart for details).  Clinical Course as of 05/13/21 1749  Fri May 13, 2021  1230 Patient discussed with [facility] poison control.  Recommend basic labs, EKG.  Recommend watching for EKG changes as well as possible seizure activity.  Also recommended acetaminophen level at 3:30 PM.  Observe in the ED until asymptomatic. [LJ]  1402 QT is mildly prolonged.  Patient's magnesium is the lower limit of normal.  We will give IV magnesium. [LJ]  1402 Acetaminophen (Tylenol), S(!): 56 Patient ingested the Tylenol/Benadryl around 10:30 AM.  This lab result is about 2 hours post ingestion.  Will obtain additional acetaminophen at 4 hours postingestion. [LJ]    Clinical Course User Index [LJ] Placido Sou, PA-C   MDM Rules/Calculators/A&P                          Pt is a 25 y.o. female who presents to the emergency department due to a suicide attempt that occurred this morning in which patient ingested Tylenol PM.  Labs: CBC without abnormalities. CMP without abnormalities. Salicylate less than 7. Acetaminophen of 56 at 12:50 PM. Repeat acetaminophen level is pending. Ethanol less than 10. Magnesium 1.7. I-STAT beta-hCG less than 5. UDS positive for THC. Respiratory panel is positive for COVID-19. UA shows 20 ketones, large leukocytes, 21-50 white blood cells, rare bacteria.  I, Placido Sou, PA-C, personally reviewed and evaluated these images and lab results as part of my medical decision-making.  Patient initially fatigued upon arrival.  Had no other physical complaints.  Vital signs of been within normal limits since arrival.  On reassessment patient  still fatigued but somewhat improved.  Alert and oriented.  Diet ordered.  Initial acetaminophen of 56 about 2 hours postingestion.  Repeat acetaminophen about 6 hours postingestion is pending.  LFTs within normal limits.  Initial ECG did show some mild QT prolongation.  Potassium within normal limits at 4.  Magnesium within normal limits but at the lower limit of normal at 1.7.  She was given IV magnesium.  It is the end of my shift and [facility].  Patient pending repeat ECG and repeat acetaminophen level.  [place] poison control recommended that she be observed in the emergency department until symptom-free.  At that point patient will likely be medically clear and will need TTS consultation.  She has been involuntarily committed.  Note: Portions of this report may have been transcribed using voice recognition software. Every effort was made to ensure accuracy; however, inadvertent computerized transcription errors may be present.   Final Clinical Impression(s) / ED Diagnoses Final diagnoses:  Suicide attempt [facility])  Intentional acetaminophen overdose, initial encounter [facility])    Rx / DC Orders ED Discharge Orders     None        Placido Sou, PA-C 05/13/21 1750    Arby Barrette, MD 05/13/21 1835

## 2021-05-13 NOTE — ED Notes (Signed)
Family member to bedside to visit with pt at this time.

## 2021-05-13 NOTE — ED Triage Notes (Signed)
Pt BIB [facility] from home after ingesting 10/20 pills (500 mg Tylenol and 25 mg Benadryl) in an attempt to end her life. Pt drowsy, but oriented on arrival. Airway intact.

## 2021-05-13 NOTE — ED Notes (Signed)
Cell phone placed back in locker.

## 2021-05-14 DIAGNOSIS — T391X2A Poisoning by 4-Aminophenol derivatives, intentional self-harm, initial encounter: Secondary | ICD-10-CM | POA: Diagnosis present

## 2021-05-14 DIAGNOSIS — F4320 Adjustment disorder, unspecified: Secondary | ICD-10-CM | POA: Diagnosis present

## 2021-05-14 NOTE — ED Notes (Signed)
Tele monitor for [facility] in place

## 2021-05-14 NOTE — ED Notes (Signed)
Pt given meal tray.

## 2021-05-14 NOTE — ED Notes (Signed)
Discharge instruction provided and reviewed with the patient.  She verbalizes understanding.  Patient is happy to be going home.  Patient is cooperative and calm.

## 2021-05-14 NOTE — ED Notes (Signed)
Pt mothers phone number obtained from pt phone. Number reads [phone].

## 2021-05-14 NOTE — ED Provider Notes (Signed)
Patient cleared for discharge by psychiatry   Lorre Nick, MD 05/14/21 [phone]

## 2021-05-14 NOTE — Consult Note (Addendum)
Telepsych Consultation   Reason for Consult:  Suicide attempt, IVC Referring Physician:  Theron Arista PA-C Location of Patient: [id number] Location of Provider: Behavioral Health TTS Department  Patient Identification: Teresa Harrington MRN:  [id number] Principal Diagnosis: Intentional acetaminophen overdose (HCC) Diagnosis:  Principal Problem:   Intentional acetaminophen overdose (HCC) Active Problems:   Adjustment disorder   Total Time spent with patient: 20 minutes  Subjective:   Teresa Harrington is a 25 y.o. female patient with no psychiatric history who was admitted with an intentional overdose of acetaminophen.  Patient presents awake, alert, and oriented sitting on bed. States she and her significant other "got into a heated argument on top of financial issues within the home and it got the best of me in the heat of the moment. Just the stress of everything got to me in heat of the moment, I would never leave my daughter. I was not thinking of my daughter when I did it". Patient became tearful stating she has 63 year old daughter whom she shares physical custody of with father. Says she feels "bad" and it was an "honest mistake, I just miss my daughter". Patient is COVID+; denies any symptoms at this time.   She denies any psychiatric history, suicidal or homicidal ideations, auditory or visual hallucinations, and does not appear acutely psychotic or delusional. She provided verbal permission to contact her mother for collateral information and safety planning.   Collateral: Teresa Langton [phone] (mother) "Out of all my children she is the most stable so this incident has shocked everyone. I've spoken to her and her response was she felt alone and that she had no support; which I don't understand because she has 4 siblings that are just a call away and I speak to her the most out of all the children. I feel this was attention seeking and she was seeking the attention of the guy she is  dealing with. I was ending my assignment early to come home to be with my kids so I will be home. This is very uncharacteristic of her so we are all in and everyone is now involved. I have no safety concerns. She has family support; her dad is going to stay with her until I come home. We've all already talked this out. One of her sisters will be there to pick her up, being that she did test positive for COVID they are out getting supplies to get tested and take to her home. I don't feel she needs to be hospitalized and think if anything she could do some therapy but no I feel she will be perfectly fine at home".   HPI:   Teresa Harrington is a 25 y.o. female patient with no psychiatric history who was admitted with an intentional overdose of acetaminophen. Patient reports having increased stressed regarding her finances and that an argument with her boyfriend "pushed her". She denies any psychiatric history and expresses regret about her actions. She lives in [place], [place] and is currently unemployed; has 66 year old daughter whom she shares physical custody of with the father.   Past Psychiatric History: none  Risk to Self:  pt denies Risk to Others:  pt denies Prior Inpatient Therapy:  pt denies Prior Outpatient Therapy:  pt denies  Past Medical History:  Past Medical History:  Diagnosis Date   Medical history non-contributory     Past Surgical History:  Procedure Laterality Date   NO PAST SURGERIES     Family History:  Family History  Problem Relation Age of Onset   Healthy Mother    Healthy Father    Family Psychiatric  History: not noted Social History:  Social History   Substance and Sexual Activity  Alcohol Use No     Social History   Substance and Sexual Activity  Drug Use No    Social History   Socioeconomic History   Marital status: Single    Spouse name: Not on file   Number of children: Not on file   Years of education: Not on file   Highest education level:  Not on file  Occupational History   Not on file  Tobacco Use   Smoking status: Never   Smokeless tobacco: Never  Vaping Use   Vaping Use: Never used  Substance and Sexual Activity   Alcohol use: No   Drug use: No   Sexual activity: Yes    Birth control/protection: Condom  Other Topics Concern   Not on file  Social History Narrative   Not on file   Social Determinants of Health   Financial Resource Strain: Not on file  Food Insecurity: Not on file  Transportation Needs: Not on file  Physical Activity: Not on file  Stress: Not on file  Social Connections: Not on file   Additional Social History:    Allergies:   Allergies  Allergen Reactions   Tramadol Hives   Other Hives    AGENT: Celery     Labs:  Results for orders placed or performed during the hospital encounter of 05/13/21 (from the past 48 hour(s))  Salicylate level     Status: Abnormal   Collection Time: 05/13/21 12:50 PM  Result Value Ref Range   Salicylate Lvl <7.0 (L) 7.0 - 30.0 mg/dL    Comment: Performed at [facility], [address] [place], [place] [address]  Comprehensive metabolic panel     Status: None   Collection Time: 05/13/21 12:50 PM  Result Value Ref Range   Sodium 138 135 - 145 mmol/L   Potassium 4.0 3.5 - 5.1 mmol/L   Chloride 105 98 - 111 mmol/L   CO2 22 22 - 32 mmol/L   Glucose, Bld 99 70 - 99 mg/dL    Comment: Glucose reference range applies only to samples taken after fasting for at least 8 hours.   BUN 8 6 - 20 mg/dL   Creatinine, Ser 9.53 0.44 - 1.00 mg/dL   Calcium 9.2 8.9 - 20.2 mg/dL   Total Protein 7.7 6.5 - 8.1 g/dL   Albumin 4.1 3.5 - 5.0 g/dL   AST 25 15 - 41 U/L   ALT 32 0 - 44 U/L   Alkaline Phosphatase 62 38 - 126 U/L   Total Bilirubin 1.0 0.3 - 1.2 mg/dL   GFR, Estimated >33 >43 mL/min    Comment: (NOTE) Calculated using the CKD-EPI Creatinine Equation (2021)    Anion gap 11 5 - 15    Comment: Performed at [facility],  [address] [place], [place] [address]  CBC with Differential     Status: None   Collection Time: 05/13/21 12:50 PM  Result Value Ref Range   WBC 7.3 4.0 - 10.5 K/uL   RBC 4.72 3.87 - 5.11 MIL/uL   Hemoglobin 14.8 12.0 - 15.0 g/dL   HCT 68.3 72.9 - 02.1 %   MCV 91.1 80.0 - 100.0 fL   MCH 31.4 26.0 - 34.0 pg   MCHC 34.4 30.0 -  36.0 g/dL   RDW 16.1 09.6 - 04.5 %   Platelets 303 150 - 400 K/uL   nRBC 0.0 0.0 - 0.2 %   Neutrophils Relative % 65 %   Neutro Abs 4.7 1.7 - 7.7 K/uL   Lymphocytes Relative 27 %   Lymphs Abs 1.9 0.7 - 4.0 K/uL   Monocytes Relative 7 %   Monocytes Absolute 0.5 0.1 - 1.0 K/uL   Eosinophils Relative 1 %   Eosinophils Absolute 0.1 0.0 - 0.5 K/uL   Basophils Relative 0 %   Basophils Absolute 0.0 0.0 - 0.1 K/uL   Immature Granulocytes 0 %   Abs Immature Granulocytes 0.02 0.00 - 0.07 K/uL    Comment: Performed at [facility], [address] [place], [place] [address]  Magnesium     Status: None   Collection Time: 05/13/21 12:50 PM  Result Value Ref Range   Magnesium 1.7 1.7 - 2.4 mg/dL    Comment: Performed at [facility], [address] [place], [place] [address]  Acetaminophen level     Status: Abnormal   Collection Time: 05/13/21 12:50 PM  Result Value Ref Range   Acetaminophen (Tylenol), Serum 56 (H) 10 - 30 ug/mL    Comment: (NOTE) Therapeutic concentrations vary significantly. A range of 10-30 ug/mL  may be an effective concentration for many patients. However, some  are best treated at concentrations outside of this range. Acetaminophen concentrations >150 ug/mL at 4 hours after ingestion  and >50 ug/mL at 12 hours after ingestion are often associated with  toxic reactions.  Performed at [facility], [address], [place], [place] [address]   Ethanol     Status: None   Collection Time: 05/13/21 12:50 PM  Result Value Ref Range   Alcohol, Ethyl (B) <10 <10 mg/dL    Comment:  (NOTE) Lowest detectable limit for serum alcohol is 10 mg/dL.  For medical purposes only. Performed at [facility], [address], [place], [place] [address]   I-Stat beta hCG blood, ED     Status: None   Collection Time: 05/13/21 12:56 PM  Result Value Ref Range   I-stat hCG, quantitative <5.0 <5 mIU/mL   Comment 3            Comment:   GEST. AGE      CONC.  (mIU/mL)   <=1 WEEK        5 - 50     2 WEEKS       50 - 500     3 WEEKS       100 - 10,000     4 WEEKS     1,000 - 30,000        FEMALE AND NON-PREGNANT FEMALE:     LESS THAN 5 mIU/mL   Urinalysis, Routine w reflex microscopic Urine, Clean Catch     Status: Abnormal   Collection Time: 05/13/21  2:10 PM  Result Value Ref Range   Color, Urine YELLOW YELLOW   APPearance HAZY (A) CLEAR   Specific Gravity, Urine 1.026 1.005 - 1.030   pH 5.0 5.0 - 8.0   Glucose, UA NEGATIVE NEGATIVE mg/dL   Hgb urine dipstick NEGATIVE NEGATIVE   Bilirubin Urine NEGATIVE NEGATIVE   Ketones, ur 20 (A) NEGATIVE mg/dL   Protein, ur NEGATIVE NEGATIVE mg/dL   Nitrite NEGATIVE NEGATIVE   Leukocytes,Ua LARGE (A) NEGATIVE   RBC / HPF 0-5 0 - 5 RBC/hpf  WBC, UA 21-50 0 - 5 WBC/hpf   Bacteria, UA RARE (A) NONE SEEN   Squamous Epithelial / LPF 6-10 0 - 5   Mucus PRESENT     Comment: Performed at [facility], [address], [place], [place] [address]  Urine rapid drug screen (hosp performed)     Status: Abnormal   Collection Time: 05/13/21  2:10 PM  Result Value Ref Range   Opiates NONE DETECTED NONE DETECTED   Cocaine NONE DETECTED NONE DETECTED   Benzodiazepines NONE DETECTED NONE DETECTED   Amphetamines NONE DETECTED NONE DETECTED   Tetrahydrocannabinol POSITIVE (A) NONE DETECTED   Barbiturates NONE DETECTED NONE DETECTED    Comment: (NOTE) DRUG SCREEN FOR MEDICAL PURPOSES ONLY.  IF CONFIRMATION IS NEEDED FOR ANY PURPOSE, NOTIFY LAB WITHIN 5 DAYS.  LOWEST DETECTABLE LIMITS FOR URINE DRUG  SCREEN Drug Class                     Cutoff (ng/mL) Amphetamine and metabolites    1000 Barbiturate and metabolites    200 Benzodiazepine                 200 Tricyclics and metabolites     300 Opiates and metabolites        300 Cocaine and metabolites        300 THC                            50 Performed at [facility], [address] [place], [place] [address]   Resp Panel by RT-PCR (Flu A&B, Covid) Nasopharyngeal Swab     Status: Abnormal   Collection Time: 05/13/21  2:29 PM   Specimen: Nasopharyngeal Swab; Nasopharyngeal(NP) swabs in vial transport medium  Result Value Ref Range   SARS Coronavirus 2 by RT PCR POSITIVE (A) NEGATIVE    Comment: RESULT CALLED TO, READ BACK BY AND VERIFIED WITH: J.NICKERSON, RN AT 1715 ON 06.24.22 BY N.THOMPSON (NOTE) SARS-CoV-2 target nucleic acids are DETECTED.  The SARS-CoV-2 RNA is generally detectable in upper respiratory specimens during the acute phase of infection. Positive results are indicative of the presence of the identified virus, but do not rule out bacterial infection or co-infection with other pathogens not detected by the test. Clinical correlation with patient history and other diagnostic information is necessary to determine patient infection status. The expected result is Negative.  Fact Sheet for Patients: BloggerCourse.com  Fact Sheet for Healthcare Providers: SeriousBroker.it  This test is not yet approved or cleared by the [place] FDA and  has been authorized for detection and/or diagnosis of SARS-CoV-2 by FDA under an Emergency Use Authorization (EUA).  This EUA will remain in effect (meaning  this test can be used) for the duration of  the COVID-19 declaration under Section 564(b)(1) of the Act, 21 U.S.C. section 360bbb-3(b)(1), unless the authorization is terminated or revoked sooner.     Influenza A by PCR NEGATIVE NEGATIVE    Influenza B by PCR NEGATIVE NEGATIVE    Comment: (NOTE) The Xpert Xpress SARS-CoV-2/FLU/RSV plus assay is intended as an aid in the diagnosis of influenza from Nasopharyngeal swab specimens and should not be used as a sole basis for treatment. Nasal washings and aspirates are unacceptable for Xpert Xpress SARS-CoV-2/FLU/RSV testing.  Fact Sheet for Patients: BloggerCourse.com  Fact Sheet for Healthcare Providers: SeriousBroker.it  This test is not yet approved or cleared by the [place]  FDA and has been authorized for detection and/or diagnosis of SARS-CoV-2 by FDA under an Emergency Use Authorization (EUA). This EUA will remain in effect (meaning this test can be used) for the duration of the COVID-19 declaration under Section 564(b)(1) of the Act, 21 U.S.C. section 360bbb-3(b)(1), unless the authorization is terminated or revoked.  Performed at [facility], [address] [place], [place] [address]   Acetaminophen level     Status: None   Collection Time: 05/13/21  5:25 PM  Result Value Ref Range   Acetaminophen (Tylenol), Serum 11 10 - 30 ug/mL    Comment: (NOTE) Therapeutic concentrations vary significantly. A range of 10-30 ug/mL  may be an effective concentration for many patients. However, some  are best treated at concentrations outside of this range. Acetaminophen concentrations >150 ug/mL at 4 hours after ingestion  and >50 ug/mL at 12 hours after ingestion are often associated with  toxic reactions.  Performed at [facility], [address] [place], [place] [address]     Medications:  No current facility-administered medications for this encounter.   Current Outpatient Medications  Medication Sig Dispense Refill   metroNIDAZOLE (FLAGYL) 500 MG tablet Take 1 tablet (500 mg total) by mouth 2 (two) times daily. 14 tablet 0   naproxen (NAPROSYN) 500 MG tablet Take 1  tablet (500 mg total) by mouth 2 (two) times daily with a meal. 30 tablet 0    Musculoskeletal: Strength & Muscle Tone: within normal limits Gait & Station: normal Patient leans: N/A  Psychiatric Specialty Exam:  Presentation  General Appearance:  Appropriate for Environment; Casual Eye Contact: Good Speech: Clear and Coherent; Normal Rate Speech Volume: Normal Handedness: No data recorded  Mood and Affect  Mood: Euthymic Affect: Tearful; Congruent  Thought Process  Thought Processes: Coherent; Goal Directed; Linear Descriptions of Associations:Intact Orientation:Full (Time, Place and Person) Thought Content:Logical History of Schizophrenia/Schizoaffective disorder:No  Duration of Psychotic Symptoms:No data recorded Hallucinations:Hallucinations: None Ideas of Reference:None Suicidal Thoughts:Suicidal Thoughts: No Homicidal Thoughts:Homicidal Thoughts: No  Sensorium  Memory: Immediate Good; Recent Good; Remote Good Judgment: Fair Insight: Good  Executive Functions  Concentration: Fair Attention Span: Fair Recall: Fair Fund of Knowledge: Good Language: Fair  Psychomotor Activity  Psychomotor Activity: Psychomotor Activity: Normal  Assets  Assets: Communication Skills; Physical Health; Resilience; Social Support; Talents/Skills; Transportation; Vocational/Educational; Leisure Time; Intimacy; Desire for Improvement; Housing  Sleep  Sleep: Sleep: Good   Physical Exam: Physical Exam Vitals and nursing note reviewed.  Constitutional:      General: She is not in acute distress.    Appearance: Normal appearance. She is normal weight. She is not ill-appearing or toxic-appearing.  HENT:     Head: Normocephalic.  Cardiovascular:     Rate and Rhythm: Normal rate and regular rhythm.     Pulses: Normal pulses.     Heart sounds: Normal heart sounds.  Pulmonary:     Effort: Pulmonary effort is normal.     Breath sounds: Normal breath sounds.   Musculoskeletal:        General: Normal range of motion.     Cervical back: Normal range of motion.  Neurological:     General: No focal deficit present.     Mental Status: She is alert and oriented to person, place, and time. Mental status is at baseline.   Review of Systems  Psychiatric/Behavioral:  Negative for hallucinations and suicidal ideas. The patient is not nervous/anxious and does not have insomnia.   All other  systems reviewed and are negative. Blood pressure (!) 106/57, pulse 71, temperature 98.8 F (37.1 C), temperature source Oral, resp. rate 18, height 5\' 5"  (1.651 m), weight 84.4 kg, SpO2 100 %. Body mass index is 30.95 kg/m.  Treatment Plan Summary: Plan Discharge patient home with family supports in place and outpatient resources for individual follow-up.   Disposition: No evidence of imminent risk to self or others at present.   Supportive therapy provided about ongoing stressors. Discussed crisis plan, support from social network, calling 911, coming to the Emergency Department, and calling Suicide Hotline. Patient has tested COVID+; to be discharged home with family support. Collateral information and safety planning completed with patient's mother Teresa Harrington.   This service was provided via telemedicine using a 2-way, interactive audio and video technology.  Names of all persons participating in this telemedicine service and their role in this encounter. Name: Maxie BarbBrooke Leevy-Johnson Role: PMHNP  Name: Nelly Routrchana Kumar Role: Attending MD  Name: ZOXWRUE AVWUJWJShakiya Harrington Role: patient  Name: Teresa Harrington Role: mother    Loletta ParishBrooke A Leevy-Johnson, NP 05/14/2021 9:43 AM

## 2021-05-14 NOTE — ED Notes (Signed)
The patient called the nurse to the room again- The patient request to speak to the Doctor.  I have spoken to Dr Freida Busman.  BH is planned to see the patient this morning.  The patient has been made aware of these plans.  "I am bored, no one is telling me what is going on"  I have in simple terms explained the situation with the patient and her IVC status.  She is taking her cardiac monitor off and said that she doesn't want to keep this on.

## 2021-05-14 NOTE — ED Notes (Signed)
Patient called nurse into the room this morning and states "do you have any idea what their plans are for me?"  Reading the counselors notes, it looks as though an inpatient facility is the plan.  She reports that she has a small child and will not be able to do this.  I instructed her to discuss that with [facility] today during their follow up.

## 2021-05-15 LAB — URINE CULTURE

## 2021-05-16 ENCOUNTER — Other Ambulatory Visit: Payer: Self-pay

## 2021-05-16 ENCOUNTER — Ambulatory Visit: Payer: Medicaid Other

## 2021-05-16 VITALS — BP 115/76 | HR 81 | Wt 173.0 lb

## 2021-05-16 DIAGNOSIS — Z0130 Encounter for examination of blood pressure without abnormal findings: Secondary | ICD-10-CM

## 2021-05-16 NOTE — Progress Notes (Addendum)
Subjective:  Jessica Hansen is a 25 y.o. female here for BP check.   Hypertension ROS: taking medications as instructed, no medication side effects noted, no TIA's, no chest pain on exertion, no dyspnea on exertion, and no swelling of ankles.    Objective:  LMP 08/16/2020   Appearance alert, well appearing, and in no distress. General exam BP noted to be well controlled today in office.    Assessment:   Blood Pressure well controlled.   Plan:  Current treatment plan is effective, no change in therapy. Pt will follow up at [facility] visit.   Nyan Dufresne l Cheney Ewart, CMA   Attestation of Attending Supervision of CMA/RN: Evaluation and management procedures were performed by the nurse under my supervision and collaboration.  I have reviewed the nursing note and chart, and I agree with the management and plan.  Carolyn L. Harraway-Smith, M.D., Cherlynn June

## 2021-05-17 ENCOUNTER — Encounter: Payer: Medicaid Other | Admitting: Advanced Practice Midwife

## 2021-06-03 ENCOUNTER — Telehealth: Payer: Self-pay

## 2021-06-03 NOTE — Telephone Encounter (Signed)
Pt called the after hours line stating she is having some heavy bleeding and clotting.  I called pt to check on her she states she is being seen at [facility]. A message was verbally given to the provider. Carrolyn Hilmes l Stephinie Battisti, CMA

## 2021-06-14 ENCOUNTER — Ambulatory Visit: Payer: Medicaid Other | Admitting: Advanced Practice Midwife

## 2021-06-21 ENCOUNTER — Ambulatory Visit (INDEPENDENT_AMBULATORY_CARE_PROVIDER_SITE_OTHER): Payer: Medicaid Other

## 2021-06-21 ENCOUNTER — Other Ambulatory Visit: Payer: Self-pay

## 2021-06-21 DIAGNOSIS — Z98891 History of uterine scar from previous surgery: Secondary | ICD-10-CM

## 2021-06-21 DIAGNOSIS — Z8759 Personal history of other complications of pregnancy, childbirth and the puerperium: Secondary | ICD-10-CM | POA: Diagnosis not present

## 2021-06-21 MED ORDER — NORETHIN ACE-ETH ESTRAD-FE 1-20 MG-MCG PO TABS: 1.0000 | ORAL_TABLET | Freq: Every day | ORAL | 3 refills | Status: DC

## 2021-06-21 NOTE — Progress Notes (Signed)
[facility] Part[facility] Visit Note  Trenay Draxler is a 25 y.o. [id number] female who presents for a postpartum visit. She is 6 weeks postpartum following a primary cesarean section.  I have fully reviewed the prenatal and intrapartum course. The delivery was at 37.1 gestational weeks.  Anesthesia: epidural. Postpartum course has been uneventful. Baby is doing well. Baby is feeding by  bottle . Bleeding no bleeding. Bowel function is normal. Bladder function is normal. Patient  has been   sexually active. Contraception method is OCP (estrogen/progesterone). Postpartum depression screening: negative.  Patient reports support from her sister for infant support.  She denies feelings of depression. Patient denies pain or difficulty with urination. She reports recent sexually activity with condom usage and denies pain or discomfort with the act. She is desiring birth control pill.   The pregnancy intention screening data noted above was reviewed. Potential methods of contraception were discussed. The patient elected to proceed with oral contraception.   Health Maintenance Due  Topic Date Due   HPV VACCINES (1 - 2-dose series) Never done   COVID-19 Vaccine (3 - Booster for Pfizer series) 02/04/2021   INFLUENZA VACCINE  06/20/2021    The following portions of the patient's history were reviewed and updated as appropriate: allergies, current medications, past family history, past medical history, past social history, past surgical history, and problem list.  Review of Systems Pertinent items noted in HPI and remainder of comprehensive ROS otherwise negative.  Objective:  BP 125/65   Pulse 67   Ht '5\' 3"'$  (1.6 m)   Wt 179 lb (81.2 kg)   LMP 08/16/2020   BMI 31.71 kg/m    General:  alert, cooperative, and no distress   Breasts:  not indicated  Lungs: clear to auscultation bilaterally  Heart:  regular rate and rhythm  Abdomen: soft, non-tender; bowel sounds normal; no masses,  no organomegaly   Wound  well approximated incision  GU exam:  not indicated       Assessment:    There are no diagnoses linked to this encounter.  6 week postpartum exam.  S/P Primary C/S GHTN-On Meds Bottle Feeding Desires Oral Contraception  Plan:   Discussed usage of oral contraception. Instructed to start today and utilize condoms during first 7 days. Informational sheet given via AVS. Verbal discontinuation precautions given. *Will plan for blood pressure check in one month with assessment of birth control pill.   *If normotensive and doing well will send in one year prescription to pharmacy. Rx for Junel sent to pharmacy on file.  Instructed to discontinue amolodipine today and take BP x 2 days. Patient to report any elevations of >/=140/90 to office for follow up. Okay to resume normal activities including work. Patient declines RTW letter.  RTO in one month for bp or prn as needed.   Essential components of care per ACOG recommendations:  1.  Mood and well being: Patient with negative depression screening today. Reviewed local resources for support.  - Patient tobacco use? No.   - hx of drug use? No.    2. Infant care and feeding:  -Patient currently breastmilk feeding? No.  -Social determinants of health (SDOH) reviewed in EPIC. No concerns  3. Sexuality, contraception and birth spacing - Patient does not want a pregnancy in the next year.  Desired family size is 1 children.  - Reviewed forms of contraception in tiered fashion. Patient desired oral contraceptives (estrogen/progesterone) today.   - Discussed birth spacing of 18 months  4. Sleep and fatigue -Encouraged family/partner/community support of 4 hrs of uninterrupted sleep to help with mood and fatigue  5. Physical Recovery  - Discussed patients delivery and complications. She describes her labor as good., "but a little scaryt." - Patient had a C-section, no problems at delivery. Patient had a  N/A  laceration. Perineal  healing reviewed. Patient expressed understanding - Patient has urinary incontinence? No. - Patient is safe to resume physical and sexual activity  6.  Health Maintenance - HM due items addressed Yes - Last pap smear  Diagnosis  Date Value Ref Range Status  10/05/2020   Final   - Negative for intraepithelial lesion or malignancy (NILM)   Pap smear not done at today's visit.  -Breast Cancer screening indicated? No.   7. Chronic Disease/Pregnancy Condition follow up: Hypertension-PRN - PCP follow up  Maryann Conners, [place] [facility] [place]

## 2021-07-05 ENCOUNTER — Encounter: Payer: Self-pay | Admitting: General Practice

## 2021-07-13 ENCOUNTER — Ambulatory Visit: Payer: Medicaid Other | Admitting: Family Medicine

## 2021-07-14 ENCOUNTER — Other Ambulatory Visit: Payer: Self-pay

## 2021-07-14 MED ORDER — NORETHIN ACE-ETH ESTRAD-FE 1-20 MG-MCG PO TABS: 1.0000 | ORAL_TABLET | Freq: Every day | ORAL | 3 refills | Status: DC

## 2021-07-18 ENCOUNTER — Ambulatory Visit: Payer: Medicaid Other

## 2021-07-27 ENCOUNTER — Ambulatory Visit: Payer: Medicaid Other

## 2021-08-16 ENCOUNTER — Telehealth: Payer: Self-pay | Admitting: Obstetrics & Gynecology

## 2021-08-16 NOTE — Telephone Encounter (Signed)
Patient state she has been bleeding x 4 weeks and she want to know if something is wrong

## 2021-08-16 NOTE — Telephone Encounter (Signed)
Return call to pt. Spoke with pt. Pt states having cycle bleeding for last 20 days since 07/30/21.  Pt states changing pad every 4 hours with moderate to light bleeding like normal cycle bleed. Pt has Nexplanon and has expires 03/2022. Pt already talked with front office and scheduled GYN visit for 11/11 with Dr Debroah Loop for removal of Nexplanon and std testing. Pt agreeable to date and time of appt.  Pt denies feeling weak, dizzy or lightheaded at this time. Pt advised to seek ER if symptoms or bleeding worsens. Pt advised to use backup method if has intercourse during this time. Pt verbalized understanding.   Judeth Cornfield, RN

## 2021-09-14 ENCOUNTER — Other Ambulatory Visit (HOSPITAL_COMMUNITY)
Admission: RE | Admit: 2021-09-14 | Discharge: 2021-09-14 | Disposition: A | Discharge status: Home / Self Care | Payer: Medicaid Other | Source: Ambulatory Visit | Attending: Obstetrics & Gynecology | Admitting: Obstetrics & Gynecology

## 2021-09-14 ENCOUNTER — Other Ambulatory Visit: Payer: Self-pay

## 2021-09-14 ENCOUNTER — Ambulatory Visit (INDEPENDENT_AMBULATORY_CARE_PROVIDER_SITE_OTHER): Payer: Medicaid Other | Admitting: Obstetrics & Gynecology

## 2021-09-14 ENCOUNTER — Encounter: Payer: Self-pay | Admitting: Obstetrics & Gynecology

## 2021-09-14 VITALS — BP 137/83 | HR 77 | Ht 63.0 in

## 2021-09-14 DIAGNOSIS — N898 Other specified noninflammatory disorders of vagina: Secondary | ICD-10-CM

## 2021-09-14 NOTE — Progress Notes (Signed)
   GYNECOLOGY OFFICE VISIT NOTE  History:   Jessica Hansen is a 25 y.o. G1P1001 here today for evaluation of abnormal vaginal discharge with itching for 2-3 weeks, has noticed some spotting with it too. She denies any pelvic pain or other concerns.    Past Medical History:  Diagnosis Date   Pregnancy induced hypertension     Past Surgical History:  Procedure Laterality Date   CESAREAN SECTION N/A 05/02/2021   Procedure: CESAREAN SECTION;  Surgeon: Janyth Pupa, DO;  Location: [facility];  Service: Obstetrics;  Laterality: N/A;    The following portions of the patient's history were reviewed and updated as appropriate: allergies, current medications, past family history, past medical history, past social history, past surgical history and problem list.   Health Maintenance:  Normal pap on 10/05/2020.  Review of Systems:  Pertinent items noted in HPI and remainder of comprehensive ROS otherwise negative.  Physical Exam:  BP 137/83   Pulse 77   Ht 5\' 3"  (1.6 m)   LMP 09/02/2021 (Exact Date)   BMI 31.71 kg/m  CONSTITUTIONAL: Well-developed, well-nourished female in no acute distress.  HEENT:  Normocephalic, atraumatic. External right and left ear normal. No scleral icterus.  NECK: Normal range of motion, supple, no masses noted on observation SKIN: No rash noted. Not diaphoretic. No erythema. No pallor. MUSCULOSKELETAL: Normal range of motion. No edema noted. NEUROLOGIC: Alert and oriented to person, place, and time. Normal muscle tone coordination. No cranial nerve deficit noted. PSYCHIATRIC: Normal mood and affect. Normal behavior. Normal judgment and thought content. CARDIOVASCULAR: Normal heart rate noted RESPIRATORY: Effort and breath sounds normal, no problems with respiration noted ABDOMEN: No masses noted. No other overt distention noted.   PELVIC: Normal appearing external genitalia; normal urethral meatus; normal appearing vaginal mucosa and cervix.  Thin white  discharge noted,testing sample obtained.  Performed in the presence of a chaperone      Assessment and Plan:     1. Vaginal discharge 2. Vaginal irritation - Cervicovaginal ancillary only( [place]) done, will follow up results and manage accordingly. Proper vulvar hygiene emphasized: discussed avoidance of perfumed soaps, detergents, lotions and any type of douches; in addition to wearing cotton underwear and no underwear at night.  Also recommended cleaning front to back, voiding and cleaning up after intercourse.   Routine preventative health maintenance measures emphasized. Please refer to After Visit Summary for other counseling recommendations.   Return for any gynecologic concerns.    I spent 10 minutes dedicated to the care of this patient including pre-visit review of records, face to face time with the patient discussing her conditions and treatments and post visit orders.    Verita Schneiders, MD, [place] for [employer], [place]

## 2021-09-16 LAB — CERVICOVAGINAL ANCILLARY ONLY
Bacterial Vaginitis (gardnerella): POSITIVE — AB
Candida Glabrata: POSITIVE — AB
Candida Vaginitis: POSITIVE — AB
Chlamydia: NEGATIVE
Comment: NEGATIVE
Comment: NEGATIVE
Comment: NEGATIVE
Comment: NEGATIVE
Comment: NEGATIVE
Comment: NORMAL
Neisseria Gonorrhea: NEGATIVE
Trichomonas: POSITIVE — AB

## 2021-09-19 ENCOUNTER — Telehealth: Payer: Self-pay

## 2021-09-19 MED ORDER — METRONIDAZOLE 500 MG PO TABS: 500.0000 mg | ORAL_TABLET | Freq: Two times a day (BID) | ORAL | 0 refills | Status: DC

## 2021-09-19 MED ORDER — FLUCONAZOLE 150 MG PO TABS: 150.0000 mg | ORAL_TABLET | Freq: Once | ORAL | 1 refills | Status: AC

## 2021-09-19 NOTE — Telephone Encounter (Signed)
Patient called and left message to return call to office.  Patient scripts for BV, yeast, and Trich have all been sent to her pharmacy.

## 2021-09-20 ENCOUNTER — Other Ambulatory Visit: Payer: Self-pay

## 2021-09-20 MED ORDER — BORIC ACID VAGINAL 600 MG VA SUPP: 1.0000 | Freq: Every day | VAGINAL | 0 refills | Status: AC

## 2021-09-27 ENCOUNTER — Other Ambulatory Visit: Payer: Self-pay

## 2021-09-27 DIAGNOSIS — Z3040 Encounter for surveillance of contraceptives, unspecified: Secondary | ICD-10-CM

## 2021-09-27 MED ORDER — NORETHIN ACE-ETH ESTRAD-FE 1-20 MG-MCG PO TABS: 1.0000 | ORAL_TABLET | Freq: Every day | ORAL | 11 refills | Status: DC

## 2021-09-30 ENCOUNTER — Encounter: Payer: Self-pay | Admitting: Obstetrics & Gynecology

## 2021-09-30 ENCOUNTER — Other Ambulatory Visit: Payer: Self-pay

## 2021-09-30 ENCOUNTER — Ambulatory Visit (INDEPENDENT_AMBULATORY_CARE_PROVIDER_SITE_OTHER): Payer: Medicaid Other | Admitting: Obstetrics & Gynecology

## 2021-09-30 ENCOUNTER — Other Ambulatory Visit (HOSPITAL_COMMUNITY)
Admission: RE | Admit: 2021-09-30 | Discharge: 2021-09-30 | Disposition: A | Discharge status: Home / Self Care | Payer: Medicaid Other | Source: Ambulatory Visit | Attending: Family Medicine | Admitting: Family Medicine

## 2021-09-30 VITALS — BP 134/66 | HR 82 | Ht 65.0 in | Wt 187.0 lb

## 2021-09-30 DIAGNOSIS — Z124 Encounter for screening for malignant neoplasm of cervix: Secondary | ICD-10-CM

## 2021-09-30 DIAGNOSIS — Z113 Encounter for screening for infections with a predominantly sexual mode of transmission: Secondary | ICD-10-CM | POA: Insufficient documentation

## 2021-09-30 DIAGNOSIS — Z3046 Encounter for surveillance of implantable subdermal contraceptive: Secondary | ICD-10-CM | POA: Diagnosis not present

## 2021-09-30 DIAGNOSIS — Z01419 Encounter for gynecological examination (general) (routine) without abnormal findings: Secondary | ICD-10-CM

## 2021-09-30 NOTE — Progress Notes (Signed)
Patient ID: Teresa Harrington, female   DOB: 1996-04-06, 25 y.o.   MRN: [id number]  Chief Complaint  Patient presents with   nexplanon removal  Annual exam  HPI Teresa Harrington is a 25 y.o. female.  G1P1001 Patient's last menstrual period was 09/07/2021 (approximate). Patient had a rash and itch adjacent to the Nexplanon and wants it removed. Some irregular bleeding noted. She is considering alternative BCM. HPI  Past Medical History:  Diagnosis Date   Medical history non-contributory     Past Surgical History:  Procedure Laterality Date   NO PAST SURGERIES      Family History  Problem Relation Age of Onset   Healthy Mother    Healthy Father     Social History Social History   Tobacco Use   Smoking status: Never   Smokeless tobacco: Never  Vaping Use   Vaping Use: Never used  Substance Use Topics   Alcohol use: No   Drug use: No    Allergies  Allergen Reactions   Tramadol Hives   Other Hives    AGENT: [facility]     Current Outpatient Medications  Medication Sig Dispense Refill   naproxen (NAPROSYN) 500 MG tablet Take 1 tablet (500 mg total) by mouth 2 (two) times daily with a meal. 30 tablet 0   metroNIDAZOLE (FLAGYL) 500 MG tablet Take 1 tablet (500 mg total) by mouth 2 (two) times daily. 14 tablet 0   No current facility-administered medications for this visit.    Review of Systems Review of Systems  Constitutional: Negative.   Respiratory: Negative.    Cardiovascular: Negative.   Gastrointestinal: Negative.   Genitourinary: Negative.  Negative for vaginal discharge.  Skin:  Positive for rash (occasional left upper arm).  Psychiatric/Behavioral: Negative.     Blood pressure 134/66, pulse 82, height 5\' 5"  (1.651 m), weight 187 lb (84.8 kg), last menstrual period 09/07/2021.  Physical Exam Physical Exam Vitals and nursing note reviewed. Exam conducted with a chaperone present.  Constitutional:      Appearance: Normal appearance.  HENT:     Head:  Normocephalic and atraumatic.  Eyes:     Pupils: Pupils are equal, round, and reactive to light.  Cardiovascular:     Rate and Rhythm: Normal rate.  Pulmonary:     Effort: Pulmonary effort is normal.  Chest:  Breasts:    Right: Normal.     Left: Normal.  Abdominal:     General: Abdomen is flat.     Palpations: Abdomen is soft.  Genitourinary:    General: Normal vulva.     Exam position: Lithotomy position.     Vagina: Normal.     Cervix: Normal.     Uterus: Normal.      Adnexa: Right adnexa normal and left adnexa normal.  Musculoskeletal:     Cervical back: Normal range of motion.  Lymphadenopathy:     Upper Body:     Right upper body: No axillary adenopathy.     Left upper body: No axillary adenopathy.  Skin:    General: Skin is warm and dry.  Neurological:     General: No focal deficit present.     Mental Status: She is alert and oriented to person, place, and time.  Psychiatric:        Mood and Affect: Mood normal.        Behavior: Behavior normal.    Data Reviewed   Assessment Screening for STDs (sexually transmitted diseases) - Plan: HIV Antibody (routine testing  w rflx), Hepatitis B Surface AntiGEN, Hepatitis C Antibody, RPR, Cervicovaginal ancillary only( [place])  Screening for cervical cancer - Plan: Cytology - PAP( [place])  Well woman exam with routine gynecological exam  Nexplanon removal   Plan Contraceptive counseling and information given Annual exam F/u testing    Scheryl Darter 09/30/2021, 9:23 AM

## 2021-09-30 NOTE — Progress Notes (Signed)
GYNECOLOGY OFFICE PROCEDURE NOTE  Teresa Harrington is a 25 y.o. G1P1001 here for Nexplanon removal.  Last pap smear was on 2020 and was normal.  No other gynecologic concerns.    Nexplanon Removal Patient identified, informed consent performed, consent signed.   Appropriate time out taken. Nexplanon site identified.  Area prepped in usual sterile fashon. One ml of 1% lidocaine was used to anesthetize the area at the distal end of the implant. A small stab incision was made right beside the implant on the distal portion.  The Nexplanon rod was grasped using hemostats and removed without difficulty.  There was minimal blood loss. There were no complications.  3 ml of 1% lidocaine was injected around the incision for post-procedure analgesia.  Steri-strips were applied over the small incision.  A pressure bandage was applied to reduce any bruising.  The patient tolerated the procedure well and was given post procedure instructions.  Patient is planning to use 2020 for contraception/attempt conception.     Adam Phenix, MD Attending Obstetrician & Gynecologist, [facility] and [facility]  09/30/2021   Patient ID: Teresa Harrington, female   DOB: 1996/02/24, 25 y.o.   MRN: [id number]

## 2021-10-03 LAB — CERVICOVAGINAL ANCILLARY ONLY
Chlamydia: NEGATIVE
Comment: NEGATIVE
Comment: NEGATIVE
Comment: NORMAL
Neisseria Gonorrhea: NEGATIVE
Trichomonas: NEGATIVE

## 2021-10-03 LAB — RPR: RPR Ser Ql: NONREACTIVE

## 2021-10-03 LAB — HIV ANTIBODY (ROUTINE TESTING W REFLEX): HIV Screen 4th Generation wRfx: NONREACTIVE

## 2021-10-03 LAB — HEPATITIS B SURFACE ANTIGEN: Hepatitis B Surface Ag: NEGATIVE

## 2021-10-03 LAB — HEPATITIS C ANTIBODY: Hep C Virus Ab: <0.1 s/co ratio (ref 0.0–0.9)

## 2021-10-05 LAB — CYTOLOGY - PAP

## 2021-10-17 ENCOUNTER — Ambulatory Visit: Payer: Medicaid Other | Admitting: Obstetrics & Gynecology

## 2021-10-20 ENCOUNTER — Ambulatory Visit: Payer: Medicaid Other | Admitting: Family Medicine

## 2021-11-10 ENCOUNTER — Telehealth: Payer: Self-pay

## 2021-11-10 ENCOUNTER — Telehealth: Payer: Self-pay | Admitting: General Practice

## 2021-11-10 NOTE — Telephone Encounter (Signed)
Called patient regarding pap results & need for colposcopy. Also explained procedure to patient. Patient verbalized understanding & will await phone call for appt.

## 2021-11-10 NOTE — Telephone Encounter (Signed)
Pt called stating she is experiencing terrible period cramps, and heavy bleeding with this cycle. Pt informed me she stopped taking her birth control pills a "few days ago" in hopes it would stop her cycle.  Advised pt to take NSAIDS/Tylenol for her cramps, and try a heating pad or warm bath. Also recommended resuming b/c Pt verbalizes understanding at this time.

## 2021-11-11 ENCOUNTER — Other Ambulatory Visit: Payer: Self-pay

## 2021-11-11 ENCOUNTER — Encounter (HOSPITAL_COMMUNITY): Payer: Self-pay | Admitting: Emergency Medicine

## 2021-11-11 ENCOUNTER — Ambulatory Visit (HOSPITAL_COMMUNITY)
Admission: EM | Admit: 2021-11-11 | Discharge: 2021-11-11 | Disposition: A | Discharge status: Home / Self Care | Payer: Medicaid Other | Attending: Physician Assistant | Admitting: Physician Assistant

## 2021-11-11 DIAGNOSIS — N760 Acute vaginitis: Secondary | ICD-10-CM

## 2021-11-11 DIAGNOSIS — N898 Other specified noninflammatory disorders of vagina: Secondary | ICD-10-CM

## 2021-11-11 DIAGNOSIS — B9689 Other specified bacterial agents as the cause of diseases classified elsewhere: Secondary | ICD-10-CM

## 2021-11-11 DIAGNOSIS — Z113 Encounter for screening for infections with a predominantly sexual mode of transmission: Secondary | ICD-10-CM

## 2021-11-11 LAB — POCT URINALYSIS DIPSTICK, ED / UC
Bilirubin Urine: NEGATIVE
Glucose, UA: NEGATIVE mg/dL
Ketones, ur: NEGATIVE mg/dL
Leukocytes,Ua: NEGATIVE
Nitrite: NEGATIVE
Protein, ur: NEGATIVE mg/dL
Specific Gravity, Urine: >=1.03 (ref 1.005–1.030)
Urobilinogen, UA: 0.2 mg/dL (ref 0.0–1.0)
pH: 6 (ref 5.0–8.0)

## 2021-11-11 LAB — POC URINE PREG, ED: Preg Test, Ur: NEGATIVE

## 2021-11-11 MED ORDER — METRONIDAZOLE 500 MG PO TABS: 500.0000 mg | ORAL_TABLET | Freq: Two times a day (BID) | ORAL | 0 refills | Status: DC

## 2021-11-11 NOTE — Discharge Instructions (Signed)
We are going to treat you for bacterial vaginosis.  Please take metronidazole twice daily for 7 days.  Do not drink any alcohol while on this medication and for 72 hours after finishing course as it will cause you to vomit.  Wear loosefitting cotton underwear and use hypoallergenic soaps and detergents.  It is important you use a condom with each sexual encounter.  We will contact you if we need to arrange any additional treatment based on your swab result.  If you develop any worsening symptoms including abdominal pain, pelvic pain, fever, nausea, vomiting you need to be seen immediately.

## 2021-11-11 NOTE — ED Triage Notes (Signed)
Pt presents with lower abdominal pain and vaginal discharge xs 1 week.

## 2021-11-11 NOTE — ED Provider Notes (Signed)
[facility]    CSN: [id number] Arrival date & time: 11/11/21  0805      History   Chief Complaint Chief Complaint  Patient presents with   Abdominal Pain   Vaginal Discharge    HPI Teresa Harrington is a 25 y.o. female.   Patient presents today with a 1 week history of vaginal discharge.  She describes this as copious, clear, malodorous.  Denies any pelvic pain, abdominal pain, nausea, vomiting, fever, urinary symptoms.  She has not tried any over-the-counter medication for symptom management.  She does have a history of bacterial vaginosis and states current symptoms are similar to previous episodes of this condition.  She does report having a new sexual partner but denies any known exposure to STI.  She declined HIV/hepatitis/syphilis testing.  She denies any new soaps or detergents.  Denies any medication changes or recent antibiotic use.   Past Medical History:  Diagnosis Date   Medical history non-contributory     Patient Active Problem List   Diagnosis Date Noted   Intentional acetaminophen overdose (HCC) 05/14/2021   Adjustment disorder 05/14/2021   VISUAL ACUITY, DECREASED, RIGHT EYE 12/27/2007   ASTHMA, UNSPECIFIED 01/17/2007    Past Surgical History:  Procedure Laterality Date   NO PAST SURGERIES      OB History     Gravida  1   Para  1   Term  1   Preterm      AB      Living  1      SAB      IAB      Ectopic      Multiple  0   Live Births  1            Home Medications    Prior to Admission medications   Medication Sig Start Date End Date Taking? Authorizing Provider  metroNIDAZOLE (FLAGYL) 500 MG tablet Take 1 tablet (500 mg total) by mouth 2 (two) times daily. 11/11/21  Yes Kian Gamarra, Denny Peon K, PA-C  naproxen (NAPROSYN) 500 MG tablet Take 1 tablet (500 mg total) by mouth 2 (two) times daily with a meal. 04/21/21   Wallis Bamberg, PA-C    Family History Family History  Problem Relation Age of Onset   Healthy Mother     Healthy Father     Social History Social History   Tobacco Use   Smoking status: Never   Smokeless tobacco: Never  Vaping Use   Vaping Use: Never used  Substance Use Topics   Alcohol use: No   Drug use: No     Allergies   Tramadol and Other   Review of Systems Review of Systems  Constitutional:  Positive for activity change. Negative for appetite change, fatigue and fever.  Respiratory:  Negative for cough and shortness of breath.   Cardiovascular:  Negative for chest pain.  Gastrointestinal:  Negative for abdominal pain, diarrhea, nausea and vomiting.  Genitourinary:  Positive for vaginal discharge. Negative for dysuria, frequency, pelvic pain, urgency, vaginal bleeding and vaginal pain.  Neurological:  Negative for dizziness, light-headedness and headaches.    Physical Exam Triage Vital Signs ED Triage Vitals  Enc Vitals Group     BP 11/11/21 0823 122/80     Pulse Rate 11/11/21 0823 65     Resp 11/11/21 0823 16     Temp 11/11/21 0823 98.3 F (36.8 C)     Temp Source 11/11/21 0823 Oral     SpO2 11/11/21 0823 100 %  Weight --      Height --      Head Circumference --      Peak Flow --      Pain Score 11/11/21 0821 1     Pain Loc --      Pain Edu? --      Excl. in GC? --    No data found.  Updated Vital Signs BP 122/80 (BP Location: Left Arm)    Pulse 65    Temp 98.3 F (36.8 C) (Oral)    Resp 16    LMP 11/04/2021    SpO2 100%   Visual Acuity Right Eye Distance:   Left Eye Distance:   Bilateral Distance:    Right Eye Near:   Left Eye Near:    Bilateral Near:     Physical Exam Vitals reviewed.  Constitutional:      General: She is awake. She is not in acute distress.    Appearance: Normal appearance. She is well-developed. She is not ill-appearing.     Comments: Very pleasant female appears stated age no acute distress sitting comfortably in exam room  HENT:     Head: Normocephalic and atraumatic.  Cardiovascular:     Rate and Rhythm: Normal  rate and regular rhythm.     Heart sounds: Normal heart sounds, S1 normal and S2 normal. No murmur heard. Pulmonary:     Effort: Pulmonary effort is normal.     Breath sounds: Normal breath sounds. No wheezing, rhonchi or rales.     Comments: Clear to auscultation bilaterally Abdominal:     General: Bowel sounds are normal.     Palpations: Abdomen is soft.     Tenderness: There is no abdominal tenderness. There is no right CVA tenderness, left CVA tenderness, guarding or rebound.     Comments: Benign abdominal exam.  No tenderness palpation.  No CVA tenderness.  Genitourinary:    Comments: Exam deferred Psychiatric:        Behavior: Behavior is cooperative.     UC Treatments / Results  Labs (all labs ordered are listed, but only abnormal results are displayed) Labs Reviewed  POCT URINALYSIS DIPSTICK, ED / UC - Abnormal; Notable for the following components:      Result Value   Hgb urine dipstick TRACE (*)    All other components within normal limits  POC URINE PREG, ED  CERVICOVAGINAL ANCILLARY ONLY    EKG   Radiology No results found.  Procedures Procedures (including critical care time)  Medications Ordered in UC Medications - No data to display  Initial Impression / Assessment and Plan / UC Course  I have reviewed the triage vital signs and the nursing notes.  Pertinent labs & imaging results that were available during my care of the patient were reviewed by me and considered in my medical decision making (see chart for details).     UA showed trace hemoglobin with no evidence of infection.  Urine pregnancy was negative in clinic today.  Patient denied any abdominal or pelvic pain during visit.  Given clinical presentation will empirically treat for bacterial vaginosis.  STI swab was collected and sent to lab; discussed that we will contact her if we need to arrange any additional treatment based on laboratory results.  She was started on metronidazole 500 mg twice  daily for 7 days.  Discussed she should not drink any alcohol with this medication and for 72 hours after completing course due to Antabuse side effects.  She  is to use hypoallergenic soaps and detergents and wear loosefitting cotton underwear.  Discussed alarm symptoms that warrant emergent evaluation including chronic pain, abdominal pain, fever, nausea, vomiting.  Strict return precautions given to which she expressed understanding.  Final Clinical Impressions(s) / UC Diagnoses   Final diagnoses:  Vaginal discharge  BV (bacterial vaginosis)  Routine screening for STI (sexually transmitted infection)     Discharge Instructions      We are going to treat you for bacterial vaginosis.  Please take metronidazole twice daily for 7 days.  Do not drink any alcohol while on this medication and for 72 hours after finishing course as it will cause you to vomit.  Wear loosefitting cotton underwear and use hypoallergenic soaps and detergents.  It is important you use a condom with each sexual encounter.  We will contact you if we need to arrange any additional treatment based on your swab result.  If you develop any worsening symptoms including abdominal pain, pelvic pain, fever, nausea, vomiting you need to be seen immediately.     ED Prescriptions     Medication Sig Dispense Auth. Provider   metroNIDAZOLE (FLAGYL) 500 MG tablet Take 1 tablet (500 mg total) by mouth 2 (two) times daily. 14 tablet Tenita Cue, Noberto Retort, PA-C      PDMP not reviewed this encounter.   Jeani Hawking, PA-C 11/11/21 7414

## 2021-11-15 LAB — CERVICOVAGINAL ANCILLARY ONLY
Bacterial Vaginitis (gardnerella): POSITIVE — AB
Candida Glabrata: NEGATIVE
Candida Vaginitis: NEGATIVE
Chlamydia: NEGATIVE
Comment: NEGATIVE
Comment: NEGATIVE
Comment: NEGATIVE
Comment: NEGATIVE
Comment: NEGATIVE
Comment: NORMAL
Neisseria Gonorrhea: NEGATIVE
Trichomonas: NEGATIVE

## 2021-11-17 ENCOUNTER — Other Ambulatory Visit (HOSPITAL_COMMUNITY)
Admission: RE | Admit: 2021-11-17 | Discharge: 2021-11-17 | Disposition: A | Discharge status: Home / Self Care | Payer: Medicaid Other | Source: Ambulatory Visit | Attending: Family Medicine | Admitting: Family Medicine

## 2021-11-17 ENCOUNTER — Other Ambulatory Visit: Payer: Self-pay

## 2021-11-17 ENCOUNTER — Ambulatory Visit (INDEPENDENT_AMBULATORY_CARE_PROVIDER_SITE_OTHER): Payer: Medicaid Other | Admitting: Family Medicine

## 2021-11-17 ENCOUNTER — Encounter: Payer: Self-pay | Admitting: Family Medicine

## 2021-11-17 VITALS — BP 125/76 | HR 85 | Wt 193.0 lb

## 2021-11-17 DIAGNOSIS — R102 Pelvic and perineal pain: Secondary | ICD-10-CM

## 2021-11-17 DIAGNOSIS — N939 Abnormal uterine and vaginal bleeding, unspecified: Secondary | ICD-10-CM

## 2021-11-17 DIAGNOSIS — D251 Intramural leiomyoma of uterus: Secondary | ICD-10-CM | POA: Diagnosis not present

## 2021-11-17 MED ORDER — NORETHINDRONE ACET-ETHINYL EST 1.5-30 MG-MCG PO TABS: 1.0000 | ORAL_TABLET | Freq: Every day | ORAL | 11 refills | Status: DC

## 2021-11-17 NOTE — Progress Notes (Signed)
° °  Subjective:    Patient ID: Jessica Hansen, female    DOB: 10/30/1996, 25 y.o.   MRN: [id number]  HPI  Patient seen for follow-up of AUB.  Patient has been on Loestrin and has been having 10 to 12 days of vaginal bleedings.  She was recently seen evaluated in the urgent care and was given Provera x10 days.  This helped to stop the bleeding, however she has been continuing to have abdominal cramping and bloating.  No palliating or provoking factors.  Her pain has been increasing.  Review of Systems     Objective:   Physical Exam Vitals reviewed. Exam conducted with a chaperone present.  Constitutional:      Appearance: She is well-developed.  Abdominal:     General: Abdomen is flat.     Hernia: There is no hernia in the left inguinal area or right inguinal area.  Genitourinary:    Labia:        Right: No rash, tenderness or lesion.        Left: No rash, tenderness or lesion.      Cervix: No cervical motion tenderness or friability.     Uterus: Normal.   Lymphadenopathy:     Lower Body: No right inguinal adenopathy. No left inguinal adenopathy.  Neurological:     Mental Status: She is alert.       Assessment & Plan:  1. Intramural leiomyoma of uterus Check [place] - US PELVIC COMPLETE WITH TRANSVAGINAL; Future  2. Pelvic pain Will get cultures.  This may be related to patient's fibroid with worsening pain. - Cervicovaginal ancillary only( [place]) - US PELVIC COMPLETE WITH TRANSVAGINAL; Future  3. Abnormal uterine bleeding (AUB) Will increase estrogen content of birth control pills.  Follow-up in 2 to 3 months to assure that this is effective.  If the patient's continuing to have breakthrough bleeding or increased bleeding during her periods, may switch to a different progesterone containing COC. - US PELVIC COMPLETE WITH TRANSVAGINAL; Future

## 2021-11-18 ENCOUNTER — Ambulatory Visit (HOSPITAL_BASED_OUTPATIENT_CLINIC_OR_DEPARTMENT_OTHER): Payer: Medicaid Other

## 2021-11-18 LAB — CERVICOVAGINAL ANCILLARY ONLY
Bacterial Vaginitis (gardnerella): POSITIVE — AB
Candida Glabrata: NEGATIVE
Candida Vaginitis: NEGATIVE
Chlamydia: NEGATIVE
Comment: NEGATIVE
Comment: NEGATIVE
Comment: NEGATIVE
Comment: NEGATIVE
Comment: NEGATIVE
Comment: NORMAL
Neisseria Gonorrhea: NEGATIVE
Trichomonas: NEGATIVE

## 2021-11-18 MED ORDER — METRONIDAZOLE 500 MG PO TABS: 500.0000 mg | ORAL_TABLET | Freq: Two times a day (BID) | ORAL | 0 refills | Status: DC

## 2021-11-18 NOTE — Addendum Note (Signed)
Addended by: Truett Mainland on: 11/18/2021 12:19 PM   Modules accepted: Orders

## 2021-11-23 ENCOUNTER — Other Ambulatory Visit: Payer: Self-pay

## 2021-11-23 ENCOUNTER — Emergency Department (HOSPITAL_COMMUNITY)
Admission: EM | Admit: 2021-11-23 | Discharge: 2021-11-23 | Disposition: A | Discharge status: Home / Self Care | Payer: Medicaid Other | Attending: Emergency Medicine | Admitting: Emergency Medicine

## 2021-11-23 ENCOUNTER — Ambulatory Visit (HOSPITAL_BASED_OUTPATIENT_CLINIC_OR_DEPARTMENT_OTHER): Payer: Medicaid Other

## 2021-11-23 DIAGNOSIS — R079 Chest pain, unspecified: Secondary | ICD-10-CM | POA: Insufficient documentation

## 2021-11-23 DIAGNOSIS — R0602 Shortness of breath: Secondary | ICD-10-CM | POA: Diagnosis not present

## 2021-11-23 DIAGNOSIS — Z5321 Procedure and treatment not carried out due to patient leaving prior to being seen by health care provider: Secondary | ICD-10-CM | POA: Diagnosis not present

## 2021-11-23 LAB — CBC WITH DIFFERENTIAL/PLATELET
Abs Immature Granulocytes: 0.06 10*3/uL (ref 0.00–0.07)
Basophils Absolute: 0 10*3/uL (ref 0.0–0.1)
Basophils Relative: 0 %
Eosinophils Absolute: 0.5 10*3/uL (ref 0.0–0.5)
Eosinophils Relative: 4 %
HCT: 43.6 % (ref 36.0–46.0)
Hemoglobin: 14.3 g/dL (ref 12.0–15.0)
Immature Granulocytes: 1 %
Lymphocytes Relative: 21 %
Lymphs Abs: 2.6 10*3/uL (ref 0.7–4.0)
MCH: 29.3 pg (ref 26.0–34.0)
MCHC: 32.8 g/dL (ref 30.0–36.0)
MCV: 89.3 fL (ref 80.0–100.0)
Monocytes Absolute: 0.7 10*3/uL (ref 0.1–1.0)
Monocytes Relative: 5 %
Neutro Abs: 8.9 10*3/uL — ABNORMAL HIGH (ref 1.7–7.7)
Neutrophils Relative %: 69 %
Platelets: 296 10*3/uL (ref 150–400)
RBC: 4.88 MIL/uL (ref 3.87–5.11)
RDW: 14.2 % (ref 11.5–15.5)
WBC: 12.8 10*3/uL — ABNORMAL HIGH (ref 4.0–10.5)
nRBC: 0 % (ref 0.0–0.2)

## 2021-11-23 LAB — I-STAT BETA HCG BLOOD, ED (MC, WL, AP ONLY): I-stat hCG, quantitative: <5 m[IU]/mL (ref <5)

## 2021-11-23 LAB — BASIC METABOLIC PANEL
Anion gap: 9 (ref 5–15)
BUN: 7 mg/dL (ref 6–20)
CO2: 20 mmol/L — ABNORMAL LOW (ref 22–32)
Calcium: 9 mg/dL (ref 8.9–10.3)
Chloride: 105 mmol/L (ref 98–111)
Creatinine, Ser: 0.64 mg/dL (ref 0.44–1.00)
GFR, Estimated: >60 mL/min (ref >60)
Glucose, Bld: 89 mg/dL (ref 70–99)
Potassium: 4.2 mmol/L (ref 3.5–5.1)
Sodium: 134 mmol/L — ABNORMAL LOW (ref 135–145)

## 2021-11-23 LAB — TROPONIN I (HIGH SENSITIVITY): Troponin I (High Sensitivity): <2 ng/L (ref <18)

## 2021-11-23 NOTE — ED Provider Triage Note (Signed)
Emergency Medicine Provider Triage Evaluation Note  Jessica Hansen , a 26 y.o. female  was evaluated in triage.  Pt complains of chest pain that has been constant since last night. Chest pain worse with lying down and with deep inspiration. No history of blood clots. She is on estrogen containing BC. No lower extremity edema. Chest pain associated with shortness of breath.   Review of Systems  Positive: CP, SOB Negative: fever  Physical Exam  BP 137/81 (BP Location: Left Arm)    Pulse 98    Temp 99.1 F (37.3 C) (Oral)    Resp 20    LMP 11/04/2021    SpO2 100%  Gen:   Awake, no distress   Resp:  Normal effort, wheeze heard throughout MSK:   Moves extremities without difficulty  Other:    Medical Decision Making  Medically screening exam initiated at 1:48 PM.  Appropriate orders placed.  Jessica Hansen was informed that the remainder of the evaluation will be completed by another provider, this initial triage assessment does not replace that evaluation, and the importance of remaining in the ED until their evaluation is complete.  CP and SOB Cardiac labs to rule out ACS CXR COVID/flu test   Suzy Bouchard, PA-C 11/23/21 1350

## 2021-11-23 NOTE — ED Triage Notes (Signed)
PT c/o on going chest pain since last night. Per patient she feels as though someone is standing on chest. Pt endorses pain that radiates to the back.

## 2021-11-24 ENCOUNTER — Ambulatory Visit (HOSPITAL_COMMUNITY)
Admission: EM | Admit: 2021-11-24 | Discharge: 2021-11-24 | Disposition: A | Discharge status: Home / Self Care | Payer: Medicaid Other | Attending: Student | Admitting: Student

## 2021-11-24 ENCOUNTER — Other Ambulatory Visit: Payer: Self-pay

## 2021-11-24 ENCOUNTER — Encounter (HOSPITAL_COMMUNITY): Payer: Self-pay

## 2021-11-24 DIAGNOSIS — Z3202 Encounter for pregnancy test, result negative: Secondary | ICD-10-CM

## 2021-11-24 DIAGNOSIS — S161XXA Strain of muscle, fascia and tendon at neck level, initial encounter: Secondary | ICD-10-CM

## 2021-11-24 LAB — POC URINE PREG, ED: Preg Test, Ur: NEGATIVE

## 2021-11-24 LAB — POC INFLUENZA A AND B ANTIGEN (URGENT CARE ONLY)
INFLUENZA A ANTIGEN, POC: NEGATIVE
INFLUENZA B ANTIGEN, POC: NEGATIVE

## 2021-11-24 MED ORDER — TIZANIDINE HCL 2 MG PO TABS: 2.0000 mg | ORAL_TABLET | Freq: Three times a day (TID) | ORAL | 0 refills | Status: DC | PRN

## 2021-11-24 NOTE — Discharge Instructions (Addendum)
-  Your pregnancy and influenza tests were negative.  -Start the muscle relaxer-Zanaflex (tizanidine), up to 3 times daily for muscle spasms and pain.  This can make you drowsy, so take at bedtime or when you do not need to drive or operate machinery. -You can take Tylenol up to 1000 mg 3 times daily, and ibuprofen up to 600 mg 3 times daily with food.  You can take these together, or alternate every 3-4 hours. -Rest, stretching, heating pad

## 2021-11-24 NOTE — ED Provider Notes (Signed)
[facility]    CSN: [id number] Arrival date & time: 11/24/21  8938      History   Chief Complaint Chief Complaint  Patient presents with   Neck Pain    HPI Teresa Harrington is a 26 y.o. female presenting with neck pain. Medical history noncontributory. Symptoms started with generalized bodyaches and chills on 1/3, which resolved on their own, and now she is only having neck pain. No pain at rest but bilateral cervical paraspinous tenderness with movement/ turning head. Denies radiation of pain down arms or legs. Ibuprofen provides temporary relief. Denies fevers/chills, n/v/d, shortness of breath, chest pain, cough, congestion, facial pain, teeth pain, headaches, sore throat, loss of taste/smell, swollen lymph nodes, ear pain. Denies trauma/overuse. Sister is also sick with bodyaches.  HPI  Past Medical History:  Diagnosis Date   Medical history non-contributory     Patient Active Problem List   Diagnosis Date Noted   Intentional acetaminophen overdose (HCC) 05/14/2021   Adjustment disorder 05/14/2021   VISUAL ACUITY, DECREASED, RIGHT EYE 12/27/2007   ASTHMA, UNSPECIFIED 01/17/2007    Past Surgical History:  Procedure Laterality Date   NO PAST SURGERIES      OB History     Gravida  1   Para  1   Term  1   Preterm      AB      Living  1      SAB      IAB      Ectopic      Multiple  0   Live Births  1            Home Medications    Prior to Admission medications   Medication Sig Start Date End Date Taking? Authorizing Provider  tiZANidine (ZANAFLEX) 2 MG tablet Take 1 tablet (2 mg total) by mouth every 8 (eight) hours as needed for muscle spasms. 11/24/21  Yes Rhys Martini, PA-C  metroNIDAZOLE (FLAGYL) 500 MG tablet Take 1 tablet (500 mg total) by mouth 2 (two) times daily. 11/11/21   Raspet, Noberto Retort, PA-C  naproxen (NAPROSYN) 500 MG tablet Take 1 tablet (500 mg total) by mouth 2 (two) times daily with a meal. 04/21/21   Wallis Bamberg,  PA-C    Family History Family History  Problem Relation Age of Onset   Healthy Mother    Healthy Father     Social History Social History   Tobacco Use   Smoking status: Never   Smokeless tobacco: Never  Vaping Use   Vaping Use: Never used  Substance Use Topics   Alcohol use: No   Drug use: No     Allergies   Tramadol and Other   Review of Systems Review of Systems  Constitutional:  Negative for appetite change, chills and fever.  HENT:  Negative for congestion, ear pain, rhinorrhea, sinus pressure, sinus pain and sore throat.   Eyes:  Negative for redness and visual disturbance.  Respiratory:  Negative for cough, chest tightness, shortness of breath and wheezing.   Cardiovascular:  Negative for chest pain and palpitations.  Gastrointestinal:  Negative for abdominal pain, constipation, diarrhea, nausea and vomiting.  Genitourinary:  Negative for dysuria, frequency and urgency.  Musculoskeletal:  Positive for neck pain. Negative for myalgias.  Neurological:  Negative for dizziness, weakness and headaches.  Psychiatric/Behavioral:  Negative for confusion.   All other systems reviewed and are negative.   Physical Exam Triage Vital Signs ED Triage Vitals  Enc Vitals Group  BP      Pulse      Resp      Temp      Temp src      SpO2      Weight      Height      Head Circumference      Peak Flow      Pain Score      Pain Loc      Pain Edu?      Excl. in GC?    No data found.  Updated Vital Signs BP 136/82 (BP Location: Right Arm)    Pulse 80    Temp 98.3 F (36.8 C) (Oral)    Resp 16    LMP  (Within Weeks) Comment: 2 weeks   SpO2 98%   Visual Acuity Right Eye Distance:   Left Eye Distance:   Bilateral Distance:    Right Eye Near:   Left Eye Near:    Bilateral Near:     Physical Exam Vitals reviewed.  Constitutional:      General: She is not in acute distress.    Appearance: Normal appearance. She is not ill-appearing.  HENT:     Head:  Normocephalic and atraumatic.     Right Ear: Tympanic membrane, ear canal and external ear normal. No tenderness. No middle ear effusion. There is no impacted cerumen. Tympanic membrane is not perforated, erythematous, retracted or bulging.     Left Ear: Tympanic membrane, ear canal and external ear normal. No tenderness.  No middle ear effusion. There is no impacted cerumen. Tympanic membrane is not perforated, erythematous, retracted or bulging.     Nose: Nose normal. No congestion.     Mouth/Throat:     Mouth: Mucous membranes are moist.     Pharynx: Uvula midline. No oropharyngeal exudate or posterior oropharyngeal erythema.  Eyes:     Extraocular Movements: Extraocular movements intact.     Pupils: Pupils are equal, round, and reactive to light.  Cardiovascular:     Rate and Rhythm: Normal rate and regular rhythm.     Heart sounds: Normal heart sounds.  Pulmonary:     Effort: Pulmonary effort is normal.     Breath sounds: Normal breath sounds. No decreased breath sounds, wheezing, rhonchi or rales.  Abdominal:     Palpations: Abdomen is soft.     Tenderness: There is no abdominal tenderness. There is no guarding or rebound.  Musculoskeletal:     Cervical back: Spasms and tenderness present.     Comments: Bilateral cervical paraspinous muscle tenderness elicited with flexion and extension neck. No tenderness to palpation. No midline spinous tenderness, deformity, stepoff. Negative spurling.  Lymphadenopathy:     Cervical: No cervical adenopathy.     Right cervical: No superficial cervical adenopathy.    Left cervical: No superficial cervical adenopathy.  Neurological:     General: No focal deficit present.     Mental Status: She is alert and oriented to person, place, and time.  Psychiatric:        Mood and Affect: Mood normal.        Behavior: Behavior normal.        Thought Content: Thought content normal.        Judgment: Judgment normal.     UC Treatments / Results   Labs (all labs ordered are listed, but only abnormal results are displayed) Labs Reviewed  POC INFLUENZA A AND B ANTIGEN (URGENT CARE ONLY)  POC URINE PREG, ED  EKG   Radiology No results found.  Procedures Procedures (including critical care time)  Medications Ordered in UC Medications - No data to display  Initial Impression / Assessment and Plan / UC Course  I have reviewed the triage vital signs and the nursing notes.  Pertinent labs & imaging results that were available during my care of the patient were reviewed by me and considered in my medical decision making (see chart for details).     This patient is a very pleasant 26 y.o. year old female presenting with neck pain. Initially with myalgias but no longer. Afebrile, nontachy.  U-preg negative. LMP 2 weeks ago.  Rapid influenza negative   Zanaflex sent.   ED return precautions discussed. Patient verbalizes understanding and agreement.      Final Clinical Impressions(s) / UC Diagnoses   Final diagnoses:  Acute strain of neck muscle, initial encounter  Negative pregnancy test     Discharge Instructions      -Your pregnancy and influenza tests were negative.  -Start the muscle relaxer-Zanaflex (tizanidine), up to 3 times daily for muscle spasms and pain.  This can make you drowsy, so take at bedtime or when you do not need to drive or operate machinery. -You can take Tylenol up to 1000 mg 3 times daily, and ibuprofen up to 600 mg 3 times daily with food.  You can take these together, or alternate every 3-4 hours. -Rest, stretching, heating pad     ED Prescriptions     Medication Sig Dispense Auth. Provider   tiZANidine (ZANAFLEX) 2 MG tablet Take 1 tablet (2 mg total) by mouth every 8 (eight) hours as needed for muscle spasms. 21 tablet Rhys MartiniGraham, Tashena Ibach E, PA-C      PDMP not reviewed this encounter.   Rhys MartiniGraham, Baily Hovanec E, PA-C 11/24/21 [phone]

## 2021-11-24 NOTE — ED Triage Notes (Signed)
Pt reports neck stiffness x 2 days.

## 2021-12-05 ENCOUNTER — Telehealth: Payer: Self-pay

## 2021-12-05 MED ORDER — MEDROXYPROGESTERONE ACETATE 10 MG PO TABS: 10.0000 mg | ORAL_TABLET | Freq: Every day | ORAL | 3 refills | Status: DC

## 2021-12-05 NOTE — Telephone Encounter (Signed)
We did talk about adding provera, but we opted to increase the estrogen component first, which is the prescription I sent to the pharmacy. We had also gotten her scheduled for [place]. It sounds like she is still bleeding, so I can add provera to see if that will help.

## 2021-12-05 NOTE — Telephone Encounter (Signed)
Patient called and made aware that Dr. Nehemiah Settle did send in her provera today. Patient plans to keep her [place] appointment on Wednesday this week. Kathrene Alu RN

## 2021-12-05 NOTE — Telephone Encounter (Signed)
Patient states that at her last visit her and Dr. Nehemiah Settle talked about adding provera with her birth control to help with cramping. The provera was never sent to her pharmacy and she is having cramping today. Will route to provider for input.  Kathrene Alu RN

## 2021-12-07 ENCOUNTER — Ambulatory Visit (HOSPITAL_BASED_OUTPATIENT_CLINIC_OR_DEPARTMENT_OTHER)
Admission: RE | Admit: 2021-12-07 | Discharge: 2021-12-07 | Disposition: A | Discharge status: Home / Self Care | Payer: Medicaid Other | Source: Ambulatory Visit | Attending: Family Medicine | Admitting: Family Medicine

## 2021-12-07 ENCOUNTER — Other Ambulatory Visit: Payer: Self-pay

## 2021-12-07 DIAGNOSIS — R102 Pelvic and perineal pain: Secondary | ICD-10-CM

## 2021-12-07 DIAGNOSIS — N939 Abnormal uterine and vaginal bleeding, unspecified: Secondary | ICD-10-CM

## 2021-12-07 DIAGNOSIS — D251 Intramural leiomyoma of uterus: Secondary | ICD-10-CM

## 2021-12-08 ENCOUNTER — Encounter: Payer: Self-pay | Admitting: Family Medicine

## 2021-12-08 ENCOUNTER — Telehealth: Payer: Self-pay

## 2021-12-08 ENCOUNTER — Ambulatory Visit (HOSPITAL_BASED_OUTPATIENT_CLINIC_OR_DEPARTMENT_OTHER)
Admission: RE | Admit: 2021-12-08 | Discharge: 2021-12-08 | Disposition: A | Discharge status: Home / Self Care | Payer: Medicaid Other | Source: Ambulatory Visit | Attending: Family Medicine | Admitting: Family Medicine

## 2021-12-08 DIAGNOSIS — D251 Intramural leiomyoma of uterus: Secondary | ICD-10-CM | POA: Insufficient documentation

## 2021-12-08 DIAGNOSIS — N939 Abnormal uterine and vaginal bleeding, unspecified: Secondary | ICD-10-CM | POA: Diagnosis present

## 2021-12-08 DIAGNOSIS — R102 Pelvic and perineal pain: Secondary | ICD-10-CM | POA: Diagnosis present

## 2021-12-08 NOTE — Telephone Encounter (Signed)
Jessica Hansen called and states that ultrasound was done today. She is having increase bleeding and cramping today. Patient is taking her provera and birth control and this doesn't seem to be helping. Patient would like to speak with provider. Will route message to Dr. Nehemiah Settle. Kathrene Alu RN

## 2021-12-08 NOTE — Telephone Encounter (Signed)
Attempted to call patient - went to voicemail. Left message and will try again.

## 2021-12-09 MED ORDER — MEDROXYPROGESTERONE ACETATE 10 MG PO TABS: 20.0000 mg | ORAL_TABLET | Freq: Every day | ORAL | 3 refills | Status: AC

## 2021-12-12 ENCOUNTER — Other Ambulatory Visit: Payer: Self-pay

## 2021-12-12 ENCOUNTER — Telehealth: Payer: Self-pay

## 2021-12-12 ENCOUNTER — Emergency Department (HOSPITAL_COMMUNITY)
Admission: EM | Admit: 2021-12-12 | Discharge: 2021-12-12 | Disposition: A | Discharge status: Home / Self Care | Payer: Medicaid Other | Attending: Emergency Medicine | Admitting: Emergency Medicine

## 2021-12-12 DIAGNOSIS — N939 Abnormal uterine and vaginal bleeding, unspecified: Secondary | ICD-10-CM | POA: Insufficient documentation

## 2021-12-12 DIAGNOSIS — R519 Headache, unspecified: Secondary | ICD-10-CM | POA: Insufficient documentation

## 2021-12-12 DIAGNOSIS — R102 Pelvic and perineal pain: Secondary | ICD-10-CM | POA: Insufficient documentation

## 2021-12-12 DIAGNOSIS — Z5321 Procedure and treatment not carried out due to patient leaving prior to being seen by health care provider: Secondary | ICD-10-CM | POA: Diagnosis not present

## 2021-12-12 LAB — CBC
HCT: 46.3 % — ABNORMAL HIGH (ref 36.0–46.0)
Hemoglobin: 14.6 g/dL (ref 12.0–15.0)
MCH: 29.4 pg (ref 26.0–34.0)
MCHC: 31.5 g/dL (ref 30.0–36.0)
MCV: 93.3 fL (ref 80.0–100.0)
Platelets: 288 10*3/uL (ref 150–400)
RBC: 4.96 MIL/uL (ref 3.87–5.11)
RDW: 14.6 % (ref 11.5–15.5)
WBC: 6.8 10*3/uL (ref 4.0–10.5)
nRBC: 0 % (ref 0.0–0.2)

## 2021-12-12 LAB — COMPREHENSIVE METABOLIC PANEL
ALT: 25 U/L (ref 0–44)
AST: 24 U/L (ref 15–41)
Albumin: 3.6 g/dL (ref 3.5–5.0)
Alkaline Phosphatase: 65 U/L (ref 38–126)
Anion gap: 8 (ref 5–15)
BUN: 5 mg/dL — ABNORMAL LOW (ref 6–20)
CO2: 21 mmol/L — ABNORMAL LOW (ref 22–32)
Calcium: 8.7 mg/dL — ABNORMAL LOW (ref 8.9–10.3)
Chloride: 106 mmol/L (ref 98–111)
Creatinine, Ser: 0.83 mg/dL (ref 0.44–1.00)
GFR, Estimated: >60 mL/min (ref >60)
Glucose, Bld: 100 mg/dL — ABNORMAL HIGH (ref 70–99)
Potassium: 4.1 mmol/L (ref 3.5–5.1)
Sodium: 135 mmol/L (ref 135–145)
Total Bilirubin: 0.4 mg/dL (ref 0.3–1.2)
Total Protein: 7.4 g/dL (ref 6.5–8.1)

## 2021-12-12 LAB — I-STAT BETA HCG BLOOD, ED (MC, WL, AP ONLY): I-stat hCG, quantitative: <5 m[IU]/mL (ref <5)

## 2021-12-12 MED ORDER — NORGESTIMATE-ETH ESTRADIOL 0.25-35 MG-MCG PO TABS: 1.0000 | ORAL_TABLET | Freq: Every day | ORAL | 3 refills | Status: DC

## 2021-12-12 MED ORDER — CITALOPRAM HYDROBROMIDE 10 MG PO TABS: 20.0000 mg | ORAL_TABLET | Freq: Every day | ORAL | 3 refills | Status: AC

## 2021-12-12 NOTE — Addendum Note (Signed)
Addended by: Truett Mainland on: 12/12/2021 05:03 PM   Modules accepted: Orders

## 2021-12-12 NOTE — ED Triage Notes (Signed)
Pt with vaginal bleeding and abdominal cramping x 2 weeks. Her OBGYN prescribed medication to stop the bleeding last Monday and she has been taking without improvement. Pt endorses constant headache but denies sob, dizziness, or weakness.

## 2021-12-12 NOTE — Telephone Encounter (Signed)
Patient Called stating she is still having bleeding and cramping. I asked patient what Dr. Nehemiah Settle and her talked about on Friday and she states he told her to double up on the provera but patient states that has increased her cramping. Patient is currently in ED. Will route message to provider for input. Patient wants something to stop the bleeding.  Kathrene Alu RN

## 2021-12-12 NOTE — ED Notes (Signed)
Pt mentions they have been waiting too long and that they wish to leave. Pt leaves at this time.

## 2021-12-12 NOTE — ED Provider Triage Note (Signed)
Emergency Medicine Provider Triage Evaluation Note  Jessica Hansen , a 26 y.o. female  was evaluated in triage.  Pt complains of pelvic pain and cramping.  Her obgyn gave her meds.  She has been taking a medication to try and stop the bleeding for two weeks, after one week (last Monday) he gave her an increased dose to try and stop.  No change.   She is changing her tampon (heavy ones) every hour.    Her obgyn did a pelvic exam and ultrasound in the office per her report since this started with out cause found.   Review of Systems  Positive:  Negative: See above  Physical Exam  BP 130/82    Pulse 100    Temp 99 F (37.2 C) (Oral)    Resp 14    SpO2 98%  Gen:   Awake, no distress   Resp:  Normal effort  MSK:   Moves extremities without difficulty  Other:  GU exam deferred.   Medical Decision Making  Medically screening exam initiated at 10:35 AM.  Appropriate orders placed.  Jessica Hansen was informed that the remainder of the evaluation will be completed by another provider, this initial triage assessment does not replace that evaluation, and the importance of remaining in the ED until their evaluation is complete.  Note: Portions of this report may have been transcribed using voice recognition software. Every effort was made to ensure accuracy; however, inadvertent computerized transcription errors may be present    Lorin Glass, PA-C 12/12/21 1038

## 2021-12-13 ENCOUNTER — Other Ambulatory Visit: Payer: Self-pay

## 2021-12-13 ENCOUNTER — Ambulatory Visit (INDEPENDENT_AMBULATORY_CARE_PROVIDER_SITE_OTHER): Payer: BC Managed Care – PPO | Admitting: Family Medicine

## 2021-12-13 ENCOUNTER — Encounter: Payer: Self-pay | Admitting: Family Medicine

## 2021-12-13 ENCOUNTER — Other Ambulatory Visit (HOSPITAL_COMMUNITY)
Admission: RE | Admit: 2021-12-13 | Discharge: 2021-12-13 | Disposition: A | Discharge status: Home / Self Care | Payer: BC Managed Care – PPO | Source: Ambulatory Visit | Attending: Family Medicine | Admitting: Family Medicine

## 2021-12-13 VITALS — BP 125/84 | HR 101 | Wt 189.0 lb

## 2021-12-13 DIAGNOSIS — R87612 Low grade squamous intraepithelial lesion on cytologic smear of cervix (LGSIL): Secondary | ICD-10-CM | POA: Insufficient documentation

## 2021-12-13 LAB — POCT PREGNANCY, URINE: Preg Test, Ur: NEGATIVE

## 2021-12-13 NOTE — Progress Notes (Signed)
° ° °  GYNECOLOGY CLINIC COLPOSCOPY PROCEDURE NOTE  26 y.o. 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  Pregnancy test: Lab Results  Component Value Date   PREGTESTUR NEGATIVE 11/24/2021    Allergies  Allergen Reactions   Tramadol Hives   Other Hives    AGENT: Celery     Patient given informed consent, signed copy in the chart, time out was performed.    Placed in lithotomy position. Cervix viewed with speculum and colposcope after application of acetic acid.   Colposcopy Adequacy Cervix fully visualized: Yes  SCJ fully visualized: No only able to see small portion around 2 o clock  Colposcopy Findings faint acetowhite lesion(s) noted at 2 o'clock  Corresponding biopsies were obtained.    ECC specimen was obtained.  All specimens were labeled and sent to pathology.  Hemostatic measures: Monsel's solution  Complications: none  Patient tolerated the procedure well.  OBGyn Exam  Colposcopy Impressions Low grade features  Plan Treatment plan pending biopsy results, per patient preference they will be communicated by telephone.  Patient was given post procedure instructions.  Will follow up pathology and manage accordingly; patient will be contacted with results and recommendations.  Routine preventative health maintenance measures emphasized.  Clarnce Flock, MD/MPH Attending Family Medicine Physician, [facility] for [facility], [place]

## 2021-12-14 ENCOUNTER — Ambulatory Visit (INDEPENDENT_AMBULATORY_CARE_PROVIDER_SITE_OTHER): Payer: Medicaid Other | Admitting: Family Medicine

## 2021-12-14 ENCOUNTER — Other Ambulatory Visit: Payer: Self-pay

## 2021-12-14 ENCOUNTER — Encounter: Payer: Self-pay | Admitting: Family Medicine

## 2021-12-14 ENCOUNTER — Encounter: Payer: Self-pay | Admitting: *Deleted

## 2021-12-14 VITALS — BP 127/77 | HR 81 | Wt 197.0 lb

## 2021-12-14 DIAGNOSIS — N939 Abnormal uterine and vaginal bleeding, unspecified: Secondary | ICD-10-CM

## 2021-12-14 NOTE — Progress Notes (Signed)
Patient states bleeding stopped last night. Kathrene Alu RN

## 2021-12-14 NOTE — Progress Notes (Signed)
° °  Subjective:    Patient ID: Jessica Hansen, female    DOB: 12/06/1995, 26 y.o.   MRN: [id number]  HPI  Patient seen for follow-up of AUB.  Patient last seen in December.  Increased her OCPs to 30 mcg tablet.  This did not control her bleeding.  Repeated the ultrasound which showed normal endometrium and no fibroids.  Progesterone was added, which again did not control her bleeding.  Earlier this week, prescribed COC taper.  She took 3 pills yesterday and this morning.  Her bleeding stopped last night and she has not had any further bleeding.  She denies side effects to the medication including headaches, nausea, upset stomach.  Review of Systems     Objective:   Physical Exam Vitals reviewed.  Constitutional:      Appearance: Normal appearance.  Pulmonary:     Effort: Pulmonary effort is normal.  Abdominal:     General: Abdomen is flat.     Palpations: Abdomen is soft.  Skin:    Capillary Refill: Capillary refill takes less than 2 seconds.  Neurological:     General: No focal deficit present.     Mental Status: She is alert.  Psychiatric:        Mood and Affect: Mood normal.        Behavior: Behavior normal.        Thought Content: Thought content normal.        Judgment: Judgment normal.      Assessment & Plan:  1. Abnormal uterine bleeding (AUB) Continue estrogen taper.  Discussed that bleeding may increase as the estrogen taper continues.  If this happens, patient will call or send message.  Discussed risks of DVTs, which are minimal at this point. She would like to take continuously, which we will do after the taper.

## 2021-12-15 LAB — SURGICAL PATHOLOGY

## 2021-12-16 ENCOUNTER — Encounter: Payer: Self-pay | Admitting: Family Medicine

## 2021-12-19 ENCOUNTER — Encounter: Payer: Self-pay | Admitting: Family Medicine

## 2021-12-21 ENCOUNTER — Ambulatory Visit (INDEPENDENT_AMBULATORY_CARE_PROVIDER_SITE_OTHER): Payer: Medicaid Other

## 2021-12-21 ENCOUNTER — Other Ambulatory Visit: Payer: Self-pay

## 2021-12-21 VITALS — BP 135/74 | HR 73 | Wt 195.0 lb

## 2021-12-21 DIAGNOSIS — Z23 Encounter for immunization: Secondary | ICD-10-CM

## 2021-12-21 NOTE — Progress Notes (Signed)
Leona Singleton here for  HPV  Injection.  Injection administered without complication. Patient will return in  2 months  for next injection.  Iisha Soyars l Declynn Lopresti, CMA 12/21/2021  9:06 AM

## 2021-12-27 ENCOUNTER — Telehealth: Payer: Self-pay

## 2021-12-27 MED ORDER — DOXYCYCLINE HYCLATE 100 MG PO CAPS: 100.0000 mg | ORAL_CAPSULE | Freq: Two times a day (BID) | ORAL | 0 refills | Status: AC

## 2021-12-27 NOTE — Addendum Note (Signed)
Addended by: Truett Mainland on: 12/27/2021 04:23 PM   Modules accepted: Orders

## 2021-12-27 NOTE — Telephone Encounter (Signed)
Pt called stating she is having bleeding like a period, bloating, and cramping.She states she is soaking 2 pads an hour. A message will be sent to the provider. Advised pt to go to the ED to be seen.Understanding was voiced. Shoaib Siefker l Lattie Cervi, CMA

## 2021-12-29 ENCOUNTER — Encounter (HOSPITAL_BASED_OUTPATIENT_CLINIC_OR_DEPARTMENT_OTHER): Payer: Self-pay

## 2021-12-29 ENCOUNTER — Emergency Department (HOSPITAL_BASED_OUTPATIENT_CLINIC_OR_DEPARTMENT_OTHER)
Admission: EM | Admit: 2021-12-29 | Discharge: 2021-12-29 | Disposition: A | Discharge status: Home / Self Care | Payer: Medicaid Other | Attending: Emergency Medicine | Admitting: Emergency Medicine

## 2021-12-29 ENCOUNTER — Emergency Department (HOSPITAL_BASED_OUTPATIENT_CLINIC_OR_DEPARTMENT_OTHER): Payer: Medicaid Other

## 2021-12-29 ENCOUNTER — Other Ambulatory Visit: Payer: Self-pay

## 2021-12-29 DIAGNOSIS — N938 Other specified abnormal uterine and vaginal bleeding: Secondary | ICD-10-CM | POA: Diagnosis present

## 2021-12-29 DIAGNOSIS — Z79899 Other long term (current) drug therapy: Secondary | ICD-10-CM | POA: Diagnosis not present

## 2021-12-29 DIAGNOSIS — N939 Abnormal uterine and vaginal bleeding, unspecified: Secondary | ICD-10-CM

## 2021-12-29 LAB — CBC
HCT: 42 % (ref 36.0–46.0)
Hemoglobin: 14.1 g/dL (ref 12.0–15.0)
MCH: 30.1 pg (ref 26.0–34.0)
MCHC: 33.6 g/dL (ref 30.0–36.0)
MCV: 89.7 fL (ref 80.0–100.0)
Platelets: 283 10*3/uL (ref 150–400)
RBC: 4.68 MIL/uL (ref 3.87–5.11)
RDW: 13.3 % (ref 11.5–15.5)
WBC: 5.9 10*3/uL (ref 4.0–10.5)
nRBC: 0 % (ref 0.0–0.2)

## 2021-12-29 LAB — BASIC METABOLIC PANEL
Anion gap: 8 (ref 5–15)
BUN: 7 mg/dL (ref 6–20)
CO2: 18 mmol/L — ABNORMAL LOW (ref 22–32)
Calcium: 8.7 mg/dL — ABNORMAL LOW (ref 8.9–10.3)
Chloride: 109 mmol/L (ref 98–111)
Creatinine, Ser: 0.73 mg/dL (ref 0.44–1.00)
GFR, Estimated: >60 mL/min (ref >60)
Glucose, Bld: 102 mg/dL — ABNORMAL HIGH (ref 70–99)
Potassium: 3.9 mmol/L (ref 3.5–5.1)
Sodium: 135 mmol/L (ref 135–145)

## 2021-12-29 LAB — PREGNANCY, URINE: Preg Test, Ur: NEGATIVE

## 2021-12-29 NOTE — ED Triage Notes (Signed)
Pt to er, pt states that she has been having some vaginal bleeding since before christmas, states that her OB has since changed her birth control pill twice and she is even on abx, but continues to have the bleeding.  Pt states that she uses about 4-5 pads per day.  Pt denies dizziness when standing.  Pt reports abd cramping.  Pt states that she has been to the er twice already for the same.

## 2021-12-29 NOTE — Discharge Instructions (Addendum)
Contact Dr. Sheran Lawless on [place].  See where he wants to go next with this persistent vaginal bleeding.  You can also follow-up on your ultrasound results on MyChart.  Hemoglobin very stable here today which is very reassuring.

## 2021-12-29 NOTE — ED Notes (Signed)
Informed secretary that she had to go. ED MD informed

## 2021-12-29 NOTE — ED Notes (Signed)
Blood drawn via 23 gauge butterfly from L hand 

## 2021-12-29 NOTE — ED Provider Notes (Addendum)
[facility] Provider Note   CSN: [id number] Arrival date & time: 12/29/21  1308     History  Chief Complaint  Patient presents with   Vaginal Bleeding    Jessica Hansen is a 26 y.o. female.  Patient was referred in by OB/GYN clinic upstairs based on telephone conversation on February 7 for evaluation in the emergency department for persistent vaginal bleeding since before Christmas.  Patient had an ultrasound January 19 complete pelvic and transvaginal that showed no fibroids.  Endometrial thickening was 5 mm.  Patient seen December 29 for the bleeding.  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  Patient seen again January 25 for abnormal uterine bleeding they have been doing hormonal manipulation.  Without much success.  Patient denies any lightheadedness or any dizziness.  Patient states that the bleeding is no worse than baseline.  Just persistent.  They even did a trial of some antibiotics with Flagyl and doxycycline.  Past medical history significant for cesarean section June 2022 and a history of pregnancy-induced hypertension.  Otherwise noncontributory.  Current medications include doxycycline and Provera and Ortho-Cyclen.        Home Medications Prior to Admission medications   Medication Sig Start Date End Date Taking? Authorizing Provider  citalopram (CELEXA) 10 MG tablet Take 2 tablets (20 mg total) by mouth daily. 12/12/21   Truett Mainland, DO  doxycycline (VIBRAMYCIN) 100 MG capsule Take 1 capsule (100 mg total) by mouth 2 (two) times daily. 12/27/21   Truett Mainland, DO  medroxyPROGESTERone (PROVERA) 10 MG tablet Take 2 tablets (20 mg total) by mouth daily. Patient not taking: Reported on 12/21/2021 12/09/21   Truett Mainland, DO  metroNIDAZOLE (FLAGYL) 500 MG tablet Take 1 tablet (500 mg total) by mouth 2 (two) times daily. Patient not taking: Reported on 12/21/2021 11/18/21   Truett Mainland, DO  norgestimate-ethinyl estradiol  (ORTHO-CYCLEN) 0.25-35 MG-MCG tablet Take 1 tablet by mouth daily. 3 tabs daily for 3 days, then 2 tabs daily for 3 days, then 1 tab daily. Skip nonhormone days and start a new pack. 12/12/21   Truett Mainland, DO      Allergies    Celery oil    Review of Systems   Review of Systems  Constitutional:  Negative for chills and fever.  HENT:  Negative for ear pain and sore throat.   Eyes:  Negative for pain and visual disturbance.  Respiratory:  Negative for cough and shortness of breath.   Cardiovascular:  Negative for chest pain and palpitations.  Gastrointestinal:  Negative for abdominal pain and vomiting.  Genitourinary:  Positive for vaginal bleeding. Negative for dysuria and hematuria.  Musculoskeletal:  Negative for arthralgias and back pain.  Skin:  Negative for color change and rash.  Neurological:  Negative for seizures and syncope.  All other systems reviewed and are negative.  Physical Exam Updated Vital Signs BP (!) 125/93    Pulse 84    Temp 98.1 F (36.7 C) (Oral)    Resp 15    Ht 1.6 m (5\' 3" )    Wt 89.4 kg    SpO2 99%    BMI 34.90 kg/m  Physical Exam Vitals and nursing note reviewed.  Constitutional:      General: She is not in acute distress.    Appearance: Normal appearance. She is well-developed.  HENT:     Head: Normocephalic and atraumatic.  Eyes:     Conjunctiva/sclera: Conjunctivae normal.  Pupils: Pupils are equal, round, and reactive to light.  Cardiovascular:     Rate and Rhythm: Normal rate and regular rhythm.     Heart sounds: No murmur heard. Pulmonary:     Effort: Pulmonary effort is normal. No respiratory distress.     Breath sounds: Normal breath sounds.  Abdominal:     General: There is no distension.     Palpations: Abdomen is soft.     Tenderness: There is no abdominal tenderness.  Musculoskeletal:        General: No swelling.     Cervical back: Normal range of motion and neck supple.  Skin:    General: Skin is warm and dry.      Capillary Refill: Capillary refill takes less than 2 seconds.  Neurological:     General: No focal deficit present.     Mental Status: She is alert and oriented to person, place, and time.  Psychiatric:        Mood and Affect: Mood normal.    ED Results / Procedures / Treatments   Labs (all labs ordered are listed, but only abnormal results are displayed) Labs Reviewed  CBC  BASIC METABOLIC PANEL  PREGNANCY, URINE    EKG None  Radiology No results found.  Procedures Procedures    Medications Ordered in ED Medications - No data to display  ED Course/ Medical Decision Making/ A&P                           Medical Decision Making Amount and/or Complexity of Data Reviewed Labs: ordered. Radiology: ordered.   We will verify pregnancy test is negative.  If it is negative then we will get ultrasound complete pelvis transvaginal.  We will check CBC basic metabolic panel again to make sure no significant development of anemia.  Chart review shows that patient's hemoglobin was very normal in January.  If work-up without significant findings.  Will need assistance from OB/GYN on where to go next with the vaginal bleeding.  If labs are normal this is a nonemergent type situation.  Patient was told to come into the emergency department.  Not clear what game plan OB/GYN has.  Patient's vital signs on presentation are very normal.  Not tachycardic not hypotensive patient in no acute distress.  Patient's lab work-up here very reassuring.  No leukocytosis.  Hemoglobin 14.1 which is essentially unchanged throughout the last couple months.  Basic metabolic panel other than CO2 18 is normal.  Renal function normal.  Pregnancy test negative.  Based on this we will go ahead and do ultrasound pelvic complete with transvaginal to get a comparison from the one that was done in January.  Patient did not want to wait for the ultrasound results.  She did refuse the transvaginal component of  it.  Patient did have an ultrasound in January.  We will just try to do as much work-up as we could.  Patient is followed by Dr. Sheran Lawless.  She is able to MyChart him for additional follow-up and to see where they want to go next in this long work-up for the chronic vaginal bleeding.  Patient's hemoglobin and hematocrit are very stable which is very reassuring.   Final Clinical Impression(s) / ED Diagnoses Final diagnoses:  Vaginal bleeding    Rx / DC Orders ED Discharge Orders     None         Fredia Sorrow, MD 12/29/21 [phone]  Fredia Sorrow, MD 12/29/21 1020    Fredia Sorrow, MD 12/29/21 1110

## 2022-01-24 ENCOUNTER — Other Ambulatory Visit: Payer: Self-pay

## 2022-01-24 ENCOUNTER — Other Ambulatory Visit (HOSPITAL_COMMUNITY)
Admission: RE | Admit: 2022-01-24 | Discharge: 2022-01-24 | Disposition: A | Discharge status: Home / Self Care | Payer: Medicaid Other | Source: Ambulatory Visit

## 2022-01-24 ENCOUNTER — Ambulatory Visit (INDEPENDENT_AMBULATORY_CARE_PROVIDER_SITE_OTHER): Payer: Medicaid Other

## 2022-01-24 VITALS — BP 125/79 | HR 103 | Wt 194.0 lb

## 2022-01-24 DIAGNOSIS — Z202 Contact with and (suspected) exposure to infections with a predominantly sexual mode of transmission: Secondary | ICD-10-CM | POA: Diagnosis not present

## 2022-01-24 NOTE — Progress Notes (Signed)
Pt presents for STD testing. Pt states she thinks she was exposed to a STD. Pt was sent to the lab to have labs drawn and self swab was sent to the lab.  ?Shoichi Mielke l Ronin Rehfeldt, CMA  ?

## 2022-01-25 LAB — CERVICOVAGINAL ANCILLARY ONLY
Chlamydia: NEGATIVE
Comment: NEGATIVE
Comment: NEGATIVE
Comment: NORMAL
Neisseria Gonorrhea: POSITIVE — AB
Trichomonas: NEGATIVE

## 2022-01-26 ENCOUNTER — Other Ambulatory Visit: Payer: Self-pay

## 2022-01-26 ENCOUNTER — Ambulatory Visit (INDEPENDENT_AMBULATORY_CARE_PROVIDER_SITE_OTHER): Payer: Medicaid Other

## 2022-01-26 VITALS — BP 117/77 | HR 85 | Wt 193.0 lb

## 2022-01-26 DIAGNOSIS — A549 Gonococcal infection, unspecified: Secondary | ICD-10-CM | POA: Diagnosis not present

## 2022-01-26 MED ORDER — CEFTRIAXONE SODIUM 500 MG IJ SOLR
500.0000 mg | Freq: Once | INTRAMUSCULAR | Status: AC
Start: 2022-01-26 — End: 2022-01-26
  Administered 2022-01-26: 14:00:00 500 mg via INTRAMUSCULAR

## 2022-01-26 NOTE — Progress Notes (Cosign Needed)
Patient presents for treatment of gonorrhea. Patient informed she will return in four weeks for test of cure. Kathrene Alu RN  ?

## 2022-02-02 ENCOUNTER — Ambulatory Visit (INDEPENDENT_AMBULATORY_CARE_PROVIDER_SITE_OTHER): Payer: BC Managed Care – PPO

## 2022-02-02 ENCOUNTER — Other Ambulatory Visit: Payer: Self-pay

## 2022-02-02 ENCOUNTER — Other Ambulatory Visit (HOSPITAL_COMMUNITY)
Admission: RE | Admit: 2022-02-02 | Discharge: 2022-02-02 | Disposition: A | Discharge status: Home / Self Care | Payer: BC Managed Care – PPO | Source: Ambulatory Visit | Attending: Family Medicine | Admitting: Family Medicine

## 2022-02-02 VITALS — BP 139/68 | HR 117 | Wt 189.7 lb

## 2022-02-02 DIAGNOSIS — N898 Other specified noninflammatory disorders of vagina: Secondary | ICD-10-CM

## 2022-02-02 DIAGNOSIS — N760 Acute vaginitis: Secondary | ICD-10-CM | POA: Diagnosis not present

## 2022-02-02 NOTE — Progress Notes (Signed)
Here today with complaint of abnormal vaginal odor. Desires full STD screening due to recent unprotected intercourse. Self swab instructions given and specimen obtained. Explained we will contact patient with any abnormal results.  ? Fleet Contras RN ?02/03/22 ?

## 2022-02-03 LAB — CERVICOVAGINAL ANCILLARY ONLY
Bacterial Vaginitis (gardnerella): POSITIVE — AB
Candida Glabrata: NEGATIVE
Candida Vaginitis: NEGATIVE
Chlamydia: NEGATIVE
Comment: NEGATIVE
Comment: NEGATIVE
Comment: NEGATIVE
Comment: NEGATIVE
Comment: NEGATIVE
Comment: NORMAL
Neisseria Gonorrhea: NEGATIVE
Trichomonas: NEGATIVE

## 2022-02-06 ENCOUNTER — Telehealth: Payer: Self-pay

## 2022-02-06 NOTE — Telephone Encounter (Signed)
Patient states that Saturday she had a lymph node on the right side of her neck swollen. Patient has previously called wanting to do her TOC earlier. Explained that we should wait four weeks from treatment.  ? ?Scheduled the patient to return to clinic to speak with provider about her concerns.  Kathrene Alu RN  ?

## 2022-02-07 ENCOUNTER — Other Ambulatory Visit: Payer: Self-pay | Admitting: Certified Nurse Midwife

## 2022-02-07 DIAGNOSIS — B9689 Other specified bacterial agents as the cause of diseases classified elsewhere: Secondary | ICD-10-CM

## 2022-02-07 MED ORDER — METRONIDAZOLE 500 MG PO TABS: 500.0000 mg | ORAL_TABLET | Freq: Two times a day (BID) | ORAL | 0 refills | Status: DC

## 2022-02-08 ENCOUNTER — Ambulatory Visit: Payer: Medicaid Other | Admitting: Family Medicine

## 2022-02-17 NOTE — Progress Notes (Signed)
I agree with positive results for BV ?

## 2022-02-21 ENCOUNTER — Ambulatory Visit (INDEPENDENT_AMBULATORY_CARE_PROVIDER_SITE_OTHER): Payer: Medicaid Other

## 2022-02-21 ENCOUNTER — Other Ambulatory Visit (HOSPITAL_COMMUNITY)
Admission: RE | Admit: 2022-02-21 | Discharge: 2022-02-21 | Disposition: A | Discharge status: Home / Self Care | Payer: Medicaid Other | Source: Ambulatory Visit | Attending: Family Medicine | Admitting: Family Medicine

## 2022-02-21 ENCOUNTER — Encounter: Payer: Self-pay | Admitting: Family Medicine

## 2022-02-21 VITALS — BP 135/71 | HR 81 | Wt 197.0 lb

## 2022-02-21 DIAGNOSIS — Z8619 Personal history of other infectious and parasitic diseases: Secondary | ICD-10-CM

## 2022-02-21 DIAGNOSIS — Z23 Encounter for immunization: Secondary | ICD-10-CM

## 2022-02-21 NOTE — Progress Notes (Signed)
Patient presents for test of care for gonorrhea. Patient also presents for 2nd dose of HPV vaccine. ? ?Patient offered to draw her STD blood work and states that she has a balance with [facility] she has to pay first. Kathrene Alu RN  ?

## 2022-02-22 LAB — GC/CHLAMYDIA PROBE AMP (~~LOC~~) NOT AT ARMC
Chlamydia: NEGATIVE
Comment: NEGATIVE
Comment: NORMAL
Neisseria Gonorrhea: NEGATIVE

## 2022-03-29 ENCOUNTER — Other Ambulatory Visit: Payer: Self-pay

## 2022-03-29 MED ORDER — FLUCONAZOLE 150 MG PO TABS: 150.0000 mg | ORAL_TABLET | Freq: Once | ORAL | 0 refills | Status: AC

## 2022-05-24 ENCOUNTER — Ambulatory Visit: Payer: BC Managed Care – PPO | Admitting: Obstetrics and Gynecology

## 2022-05-29 ENCOUNTER — Other Ambulatory Visit (HOSPITAL_COMMUNITY)
Admission: RE | Admit: 2022-05-29 | Discharge: 2022-05-29 | Disposition: A | Discharge status: Home / Self Care | Payer: BC Managed Care – PPO | Source: Ambulatory Visit | Attending: Family Medicine | Admitting: Family Medicine

## 2022-05-29 ENCOUNTER — Ambulatory Visit (INDEPENDENT_AMBULATORY_CARE_PROVIDER_SITE_OTHER): Payer: BC Managed Care – PPO | Admitting: General Practice

## 2022-05-29 ENCOUNTER — Other Ambulatory Visit: Payer: Self-pay

## 2022-05-29 VITALS — BP 127/78 | HR 56 | Ht 65.0 in | Wt 183.0 lb

## 2022-05-29 DIAGNOSIS — Z113 Encounter for screening for infections with a predominantly sexual mode of transmission: Secondary | ICD-10-CM

## 2022-05-29 NOTE — Progress Notes (Signed)
Patient presents to office today requesting self swab for STD screening. Patient had HIV, RPR, Hep B, & Hep C in November. Patient was instructed in self swab & specimen collected. Discussed results will be in mychart in 24-48 hours and any needed prescriptions will be sent to her pharmacy. Reviewed future pap screening dates. Patient verbalized understanding.  Chase Caller RN BSN 05/29/22

## 2022-05-30 LAB — CERVICOVAGINAL ANCILLARY ONLY
Chlamydia: NEGATIVE
Comment: NEGATIVE
Comment: NEGATIVE
Comment: NORMAL
Neisseria Gonorrhea: NEGATIVE
Trichomonas: NEGATIVE

## 2022-06-02 ENCOUNTER — Ambulatory Visit (INDEPENDENT_AMBULATORY_CARE_PROVIDER_SITE_OTHER): Payer: Medicaid Other

## 2022-06-02 ENCOUNTER — Other Ambulatory Visit (HOSPITAL_COMMUNITY)
Admission: RE | Admit: 2022-06-02 | Discharge: 2022-06-02 | Disposition: A | Discharge status: Home / Self Care | Payer: Medicaid Other | Source: Ambulatory Visit | Attending: Obstetrics and Gynecology | Admitting: Obstetrics and Gynecology

## 2022-06-02 VITALS — BP 114/73 | HR 80

## 2022-06-02 DIAGNOSIS — B379 Candidiasis, unspecified: Secondary | ICD-10-CM

## 2022-06-02 DIAGNOSIS — Z7251 High risk heterosexual behavior: Secondary | ICD-10-CM

## 2022-06-02 DIAGNOSIS — Z113 Encounter for screening for infections with a predominantly sexual mode of transmission: Secondary | ICD-10-CM | POA: Insufficient documentation

## 2022-06-02 DIAGNOSIS — B9689 Other specified bacterial agents as the cause of diseases classified elsewhere: Secondary | ICD-10-CM

## 2022-06-02 LAB — POCT URINE PREGNANCY: Preg Test, Ur: NEGATIVE

## 2022-06-02 NOTE — Progress Notes (Signed)
SUBJECTIVE:  26 y.o. female who desires a STI screen. Denies abnormal vaginal discharge, bleeding or significant pelvic pain. No UTI symptoms. Denies history of known exposure to STD.  No LMP recorded.  OBJECTIVE:  She appears well.   ASSESSMENT:  STI Screen UPT: Negative   PLAN:  GC, chlamydia, and trichomonas probe sent to lab.  Treatment: To be determined once lab results are received.  Pt follow up as needed.

## 2022-06-05 LAB — CERVICOVAGINAL ANCILLARY ONLY
Bacterial Vaginitis (gardnerella): POSITIVE — AB
Candida Glabrata: POSITIVE — AB
Candida Vaginitis: POSITIVE — AB
Chlamydia: NEGATIVE
Comment: NEGATIVE
Comment: NEGATIVE
Comment: NEGATIVE
Comment: NEGATIVE
Comment: NEGATIVE
Comment: NORMAL
Neisseria Gonorrhea: NEGATIVE
Trichomonas: NEGATIVE

## 2022-06-05 MED ORDER — NYSTATIN 100000 UNIT/GM EX CREA: TOPICAL_CREAM | CUTANEOUS | 0 refills | Status: AC

## 2022-06-05 MED ORDER — METRONIDAZOLE 500 MG PO TABS: 500.0000 mg | ORAL_TABLET | Freq: Two times a day (BID) | ORAL | 0 refills | Status: DC

## 2022-06-05 NOTE — Addendum Note (Signed)
Addended by: Radene Gunning A on: 06/05/2022 01:59 PM   Modules accepted: Orders

## 2022-06-20 ENCOUNTER — Ambulatory Visit: Payer: Medicaid Other

## 2022-06-28 ENCOUNTER — Ambulatory Visit (INDEPENDENT_AMBULATORY_CARE_PROVIDER_SITE_OTHER): Payer: Medicaid Other

## 2022-06-28 VITALS — BP 132/79 | HR 79

## 2022-06-28 DIAGNOSIS — Z23 Encounter for immunization: Secondary | ICD-10-CM

## 2022-06-28 NOTE — Progress Notes (Cosign Needed)
Patient presents for 3rd HPV vaccine. [facility]

## 2022-07-06 ENCOUNTER — Ambulatory Visit: Payer: Medicaid Other | Admitting: Family Medicine

## 2022-07-10 ENCOUNTER — Encounter: Payer: Self-pay | Admitting: Emergency Medicine

## 2022-07-10 ENCOUNTER — Ambulatory Visit
Admission: EM | Admit: 2022-07-10 | Discharge: 2022-07-10 | Disposition: A | Discharge status: Home / Self Care | Payer: Medicaid Other | Attending: Internal Medicine | Admitting: Internal Medicine

## 2022-07-10 DIAGNOSIS — Z3202 Encounter for pregnancy test, result negative: Secondary | ICD-10-CM | POA: Diagnosis not present

## 2022-07-10 DIAGNOSIS — N939 Abnormal uterine and vaginal bleeding, unspecified: Secondary | ICD-10-CM | POA: Diagnosis present

## 2022-07-10 LAB — POCT URINALYSIS DIP (MANUAL ENTRY)
Bilirubin, UA: NEGATIVE
Glucose, UA: NEGATIVE mg/dL
Ketones, POC UA: NEGATIVE mg/dL
Leukocytes, UA: NEGATIVE
Nitrite, UA: NEGATIVE
Protein Ur, POC: NEGATIVE mg/dL
Spec Grav, UA: >=1.03 — AB (ref 1.010–1.025)
Urobilinogen, UA: 0.2 E.U./dL
pH, UA: 5.5 (ref 5.0–8.0)

## 2022-07-10 LAB — CBC
Hematocrit: 43.9 % (ref 34.0–46.6)
Hemoglobin: 14.5 g/dL (ref 11.1–15.9)
MCH: 29.9 pg (ref 26.6–33.0)
MCHC: 33 g/dL (ref 31.5–35.7)
MCV: 91 fL (ref 79–97)
Platelets: 305 10*3/uL (ref 150–450)
RBC: 4.85 x10E6/uL (ref 3.77–5.28)
RDW: 12.9 % (ref 11.7–15.4)
WBC: 6.9 10*3/uL (ref 3.4–10.8)

## 2022-07-10 LAB — POCT URINE PREGNANCY: Preg Test, Ur: NEGATIVE

## 2022-07-10 NOTE — Discharge Instructions (Signed)
Urine pregnancy test was negative.  Urinalysis to check urine for infection was also normal.  Your vaginal swab and blood work are pending.  We will call if there are abnormalities.  I will recommend that you follow-up with gynecology for further evaluation management as well.

## 2022-07-10 NOTE — ED Provider Notes (Signed)
[facility]    CSN: [id number] Arrival date & time: 07/10/22  0809      History   Chief Complaint Chief Complaint  Patient presents with   Vaginal Bleeding    HPI Maclaine Ahola is a 26 y.o. female.   Patient presents with abnormal vaginal bleeding that started yesterday.  Patient reports that she had a menstrual cycle on 7/18 and another cycle started on 8/11.  Patient states that last menstrual cycle stopped about 3 days ago and restarted yesterday.  Patient reports normal amount of vaginal bleeding and she is changing her tampon about every 3 hours.  Denies any dizziness or fatigue.  Denies any vaginal discharge, abdominal pain, back pain, fever, hematuria, urinary symptoms.  Denies any exposure to STD.  Patient has a history of abnormal vaginal bleeding a few months prior and patient states that this was related to her taking hormonal birth control.  Patient reports after she stopped taking birth control, the bleeding stopped.   Vaginal Bleeding   Past Medical History:  Diagnosis Date   Pregnancy induced hypertension     Patient Active Problem List   Diagnosis Date Noted   Gestational hypertension 04/28/2021   Uterine fibroid 04/12/2021    Past Surgical History:  Procedure Laterality Date   CESAREAN SECTION N/A 05/02/2021   Procedure: CESAREAN SECTION;  Surgeon: Janyth Pupa, DO;  Location: [facility] LD [facility]S;  Service: Obstetrics;  Laterality: N/A;    OB History     Gravida  1   Para  1   Term  1   Preterm      AB      Living  1      SAB      IAB      Ectopic      Multiple  0   Live Births  1            Home Medications    Prior to Admission medications   Medication Sig Start Date End Date Taking? Authorizing Provider  citalopram (CELEXA) 10 MG tablet Take 2 tablets (20 mg total) by mouth daily. 12/12/21   Truett Mainland, DO  doxycycline (VIBRAMYCIN) 100 MG capsule Take 1 capsule (100 mg total) by mouth 2 (two) times daily.  12/27/21   Truett Mainland, DO  medroxyPROGESTERone (PROVERA) 10 MG tablet Take 2 tablets (20 mg total) by mouth daily. 12/09/21   Truett Mainland, DO  metroNIDAZOLE (FLAGYL) 500 MG tablet Take 1 tablet (500 mg total) by mouth 2 (two) times daily. 06/05/22   Radene Gunning, MD  norgestimate-ethinyl estradiol (ORTHO-CYCLEN) 0.25-35 MG-MCG tablet Take 1 tablet by mouth daily. 3 tabs daily for 3 days, then 2 tabs daily for 3 days, then 1 tab daily. Skip nonhormone days and start a new pack. 12/12/21   Truett Mainland, DO  nystatin cream (MYCOSTATIN) One full applicator (one gram) inserted vaginally for fourteen nights 06/05/22   Radene Gunning, MD    Family History Family History  Problem Relation Age of Onset   Depression Mother    Healthy Father     Social History Social History   Tobacco Use   Smoking status: Former   Smokeless tobacco: Never  Scientific laboratory technician Use: Never used  Substance Use Topics   Alcohol use: Not Currently    Comment: social   Drug use: Never     Allergies   Celery oil   Review of Systems Review of Systems Per HPI  Physical Exam Triage Vital Signs ED Triage Vitals  Enc Vitals Group     BP 07/10/22 0824 133/83     Pulse Rate 07/10/22 0824 72     Resp 07/10/22 0824 18     Temp 07/10/22 0824 97.8 F (36.6 C)     Temp src --      SpO2 07/10/22 0824 100 %     Weight --      Height --      Head Circumference --      Peak Flow --      Pain Score 07/10/22 0822 0     Pain Loc --      Pain Edu? --      Excl. in [place]? --    No data found.  Updated Vital Signs BP 133/83   Pulse 72   Temp 97.8 F (36.6 C)   Resp 18   LMP 06/30/2022   SpO2 100%   Breastfeeding No   Visual Acuity Right Eye Distance:   Left Eye Distance:   Bilateral Distance:    Right Eye Near:   Left Eye Near:    Bilateral Near:     Physical Exam Constitutional:      General: She is not in acute distress.    Appearance: Normal appearance. She is not toxic-appearing or  diaphoretic.  HENT:     Head: Normocephalic and atraumatic.  Eyes:     Extraocular Movements: Extraocular movements intact.     Conjunctiva/sclera: Conjunctivae normal.  Cardiovascular:     Rate and Rhythm: Normal rate and regular rhythm.     Pulses: Normal pulses.     Heart sounds: Normal heart sounds.  Pulmonary:     Effort: Pulmonary effort is normal. No respiratory distress.     Breath sounds: Normal breath sounds.  Genitourinary:    Comments: Deferred with shared decision making.  Self swab performed. Neurological:     General: No focal deficit present.     Mental Status: She is alert and oriented to person, place, and time. Mental status is at baseline.  Psychiatric:        Mood and Affect: Mood normal.        Behavior: Behavior normal.        Thought Content: Thought content normal.        Judgment: Judgment normal.      UC Treatments / Results  Labs (all labs ordered are listed, but only abnormal results are displayed) Labs Reviewed  POCT URINALYSIS DIP (MANUAL ENTRY) - Abnormal; Notable for the following components:      Result Value   Spec Grav, UA >=1.030 (*)    Blood, UA large (*)    All other components within normal limits  CBC  POCT URINE PREGNANCY  CERVICOVAGINAL ANCILLARY ONLY    EKG   Radiology No results found.  Procedures Procedures (including critical care time)  Medications Ordered in UC Medications - No data to display  Initial Impression / Assessment and Plan / UC Course  I have reviewed the triage vital signs and the nursing notes.  Pertinent labs & imaging results that were available during my care of the patient were reviewed by me and considered in my medical decision making (see chart for details).     Urine pregnancy was negative.  Urinalysis unremarkable.  Cervicovaginal swab pending.  CBC also pending.  Patient appears physically well so do not think that emergent evaluation is necessary.  Will avoid Megace given patient's  intolerance to hormones in the past with vaginal bleeding.  Advised to follow-up with her established gynecologist for further evaluation and management.  She was also given strict ER precautions.  Patient verbalized understanding and was agreeable with plan. Final Clinical Impressions(s) / UC Diagnoses   Final diagnoses:  Abnormal vaginal bleeding     Discharge Instructions      Urine pregnancy test was negative.  Urinalysis to check urine for infection was also normal.  Your vaginal swab and blood work are pending.  We will call if there are abnormalities.  I will recommend that you follow-up with gynecology for further evaluation management as well.    ED Prescriptions   None    PDMP not reviewed this encounter.   Teodora Medici, [place] 07/10/22 [phone]

## 2022-07-10 NOTE — ED Triage Notes (Signed)
Pt is present today with concerns for abnormal vaginal bleeding. Pt states that her last two LMP was 7/18 and 8/11. Pt states that she started bleeding again this morning. Pt denies being on [facility]

## 2022-07-11 ENCOUNTER — Other Ambulatory Visit: Payer: Self-pay

## 2022-07-11 ENCOUNTER — Ambulatory Visit: Payer: Medicaid Other

## 2022-07-11 ENCOUNTER — Telehealth: Payer: Self-pay

## 2022-07-11 DIAGNOSIS — B9689 Other specified bacterial agents as the cause of diseases classified elsewhere: Secondary | ICD-10-CM

## 2022-07-11 LAB — CERVICOVAGINAL ANCILLARY ONLY
Bacterial Vaginitis (gardnerella): POSITIVE — AB
Candida Glabrata: NEGATIVE
Candida Vaginitis: NEGATIVE
Chlamydia: NEGATIVE
Comment: NEGATIVE
Comment: NEGATIVE
Comment: NEGATIVE
Comment: NEGATIVE
Comment: NEGATIVE
Comment: NORMAL
Neisseria Gonorrhea: NEGATIVE
Trichomonas: NEGATIVE

## 2022-07-11 MED ORDER — METRONIDAZOLE 500 MG PO TABS: 500.0000 mg | ORAL_TABLET | Freq: Two times a day (BID) | ORAL | 0 refills | Status: DC

## 2022-07-11 NOTE — Telephone Encounter (Signed)
Patient had vaginal culture done in hospital yesteday and it came back positive for BV. Sent in Flagyl per protocol. Patient states that she stopped bleeding Friday and then her period started again on Monday. Patient scheduled for appointment in office tomorrow to speak with provider about irregular bleeding. Jessica Hansen [facility]

## 2022-07-12 ENCOUNTER — Encounter: Payer: Self-pay | Admitting: Family Medicine

## 2022-07-12 ENCOUNTER — Telehealth (HOSPITAL_COMMUNITY): Payer: Self-pay | Admitting: Emergency Medicine

## 2022-07-12 ENCOUNTER — Ambulatory Visit (INDEPENDENT_AMBULATORY_CARE_PROVIDER_SITE_OTHER): Payer: Medicaid Other | Admitting: Family Medicine

## 2022-07-12 VITALS — BP 117/69 | HR 73 | Ht 63.0 in | Wt 196.0 lb

## 2022-07-12 DIAGNOSIS — N923 Ovulation bleeding: Secondary | ICD-10-CM

## 2022-07-12 MED ORDER — FLUCONAZOLE 150 MG PO TABS: 150.0000 mg | ORAL_TABLET | ORAL | 3 refills | Status: DC

## 2022-07-12 MED ORDER — IBUPROFEN 600 MG PO TABS: 600.0000 mg | ORAL_TABLET | Freq: Four times a day (QID) | ORAL | 0 refills | Status: AC

## 2022-07-12 NOTE — Progress Notes (Signed)
   Subjective:    Patient ID: Jessica Hansen, female    DOB: [birth year], 26 y.o.   MRN: [id number]  HPI  Patient seen for irregular bleeding.  Patient had a period was slightly shorter interval that started on 8/11 and stopped on 8/18.  She remained without bleeding for 2 days, then restarted on 8/21 with spotting and is continued to spot.  She was recently diagnosed with BV, but has not started taking the Flagyl.  She has taken several pregnancy tests which have all been negative.  She does have a history of AUB with irregular bleeding.  She continued to have AUB despite different types of COC's.  She stopped taking the COC's in April and began having regular cycles again.  She is not currently using anything hormonal for birth control -she does have a sperm producing partner and they practice the withdrawal method.    Review of Systems     Objective:   Physical Exam Vitals reviewed.  Constitutional:      Appearance: Normal appearance.  Abdominal:     General: Abdomen is flat.     Palpations: Abdomen is soft.  Skin:    Capillary Refill: Capillary refill takes less than 2 seconds.  Neurological:     General: No focal deficit present.     Mental Status: She is alert.  Psychiatric:        Mood and Affect: Mood normal.        Behavior: Behavior normal.        Thought Content: Thought content normal.      Assessment & Plan:  1. Spotting between menses With patient's last experience with hormonal control, I'm not anxious to start the patient on hormones. Will hold on the hormones and have patient take ibuprofen q6 hours for the next few days. Will see if treating BV will help. Patient to call beginning of next week.

## 2022-07-12 NOTE — Progress Notes (Signed)
Recent vaginal cultures done in ED were positive for BV. Prescription sent in yesterday for patient.  Patient complaining of ongoing spotting irregular. Jessica Hansen [facility]

## 2022-07-19 ENCOUNTER — Ambulatory Visit: Payer: Medicaid Other | Admitting: Family Medicine

## 2022-07-29 ENCOUNTER — Ambulatory Visit
Admission: EM | Admit: 2022-07-29 | Discharge: 2022-07-29 | Disposition: A | Discharge status: Home / Self Care | Payer: Medicaid Other | Attending: Internal Medicine | Admitting: Internal Medicine

## 2022-07-29 DIAGNOSIS — H6593 Unspecified nonsuppurative otitis media, bilateral: Secondary | ICD-10-CM

## 2022-07-29 MED ORDER — PREDNISONE 20 MG PO TABS: 40.0000 mg | ORAL_TABLET | Freq: Every day | ORAL | 0 refills | Status: AC

## 2022-07-29 NOTE — ED Triage Notes (Signed)
Pt c/o feeling dizzy since this morning and new onset bilat tinnitus. Denies pain.

## 2022-07-29 NOTE — ED Provider Notes (Signed)
[facility]    CSN: [id number] Arrival date & time: 07/29/22  1050      History   Chief Complaint Chief Complaint  Patient presents with   Dizziness    HPI Jessica Hansen is a 26 y.o. female.   Patient presents with dizziness and ringing in ears that started today.  Denies any pain, drainage, trauma, foreign body, decreased hearing from the ears.  Denies any associated upper respiratory symptoms.  Denies any history of the same.  Denies chest pain, shortness of breath, nausea, vomiting, headache.   Dizziness   Past Medical History:  Diagnosis Date   Pregnancy induced hypertension     Patient Active Problem List   Diagnosis Date Noted   Gestational hypertension 04/28/2021   Uterine fibroid 04/12/2021    Past Surgical History:  Procedure Laterality Date   CESAREAN SECTION N/A 05/02/2021   Procedure: CESAREAN SECTION;  Surgeon: Janyth Pupa, DO;  Location: [facility] LD ORS;  Service: Obstetrics;  Laterality: N/A;    OB History     Gravida  1   Para  1   Term  1   Preterm      AB      Living  1      SAB      IAB      Ectopic      Multiple  0   Live Births  1            Home Medications    Prior to Admission medications   Medication Sig Start Date End Date Taking? Authorizing Provider  predniSONE (DELTASONE) 20 MG tablet Take 2 tablets (40 mg total) by mouth daily for 5 days. 07/29/22 08/03/22 Yes Starnisha Batrez, Michele Rockers, FNP  citalopram (CELEXA) 10 MG tablet Take 2 tablets (20 mg total) by mouth daily. Patient not taking: Reported on 07/12/2022 12/12/21   Truett Mainland, DO  doxycycline (VIBRAMYCIN) 100 MG capsule Take 1 capsule (100 mg total) by mouth 2 (two) times daily. Patient not taking: Reported on 07/12/2022 12/27/21   Truett Mainland, DO  fluconazole (DIFLUCAN) 150 MG tablet Take 1 tablet (150 mg total) by mouth every 3 (three) days. For three doses 07/12/22   Truett Mainland, DO  medroxyPROGESTERone (PROVERA) 10 MG tablet Take 2 tablets  (20 mg total) by mouth daily. Patient not taking: Reported on 07/12/2022 12/09/21   Truett Mainland, DO  metroNIDAZOLE (FLAGYL) 500 MG tablet Take 1 tablet (500 mg total) by mouth 2 (two) times daily. Patient not taking: Reported on 07/12/2022 07/11/22   Truett Mainland, DO  norgestimate-ethinyl estradiol (ORTHO-CYCLEN) 0.25-35 MG-MCG tablet Take 1 tablet by mouth daily. 3 tabs daily for 3 days, then 2 tabs daily for 3 days, then 1 tab daily. Skip nonhormone days and start a new pack. Patient not taking: Reported on 07/12/2022 12/12/21   Truett Mainland, DO  nystatin cream (MYCOSTATIN) One full applicator (one gram) inserted vaginally for fourteen nights Patient not taking: Reported on 07/12/2022 06/05/22   Radene Gunning, MD    Family History Family History  Problem Relation Age of Onset   Depression Mother    Healthy Father     Social History Social History   Tobacco Use   Smoking status: Former   Smokeless tobacco: Never  Scientific laboratory technician Use: Never used  Substance Use Topics   Alcohol use: Not Currently    Comment: social   Drug use: Never     Allergies  Celery oil   Review of Systems Review of Systems Per HPI  Physical Exam Triage Vital Signs ED Triage Vitals [07/29/22 1132]  Enc Vitals Group     BP 114/73     Pulse Rate 85     Resp 16     Temp 98 F (36.7 C)     Temp Source Oral     SpO2 97 %     Weight      Height      Head Circumference      Peak Flow      Pain Score 0     Pain Loc      Pain Edu?      Excl. in [place]?    No data found.  Updated Vital Signs BP 114/73 (BP Location: Left Arm)   Pulse 85   Temp 98 F (36.7 C) (Oral)   Resp 16   LMP 06/30/2022   SpO2 97%   Breastfeeding No   Visual Acuity Right Eye Distance:   Left Eye Distance:   Bilateral Distance:    Right Eye Near:   Left Eye Near:    Bilateral Near:     Physical Exam Constitutional:      General: She is not in acute distress.    Appearance: Normal appearance. She  is not toxic-appearing or diaphoretic.  HENT:     Head: Normocephalic and atraumatic.     Right Ear: No drainage, swelling or tenderness. A middle ear effusion is present. There is no impacted cerumen. No foreign body. Tympanic membrane is bulging. Tympanic membrane is not perforated or erythematous.     Left Ear: No drainage, swelling or tenderness. A middle ear effusion is present. There is no impacted cerumen. No foreign body. Tympanic membrane is bulging. Tympanic membrane is not perforated or erythematous.  Eyes:     Extraocular Movements: Extraocular movements intact.     Conjunctiva/sclera: Conjunctivae normal.  Cardiovascular:     Rate and Rhythm: Normal rate and regular rhythm.     Pulses: Normal pulses.     Heart sounds: Normal heart sounds.  Pulmonary:     Effort: Pulmonary effort is normal. No respiratory distress.     Breath sounds: Normal breath sounds.  Neurological:     General: No focal deficit present.     Mental Status: She is alert and oriented to person, place, and time. Mental status is at baseline.     Cranial Nerves: Cranial nerves 2-12 are intact.     Sensory: Sensation is intact.     Motor: Motor function is intact.     Coordination: Coordination is intact.     Gait: Gait is intact.  Psychiatric:        Mood and Affect: Mood normal.        Behavior: Behavior normal.        Thought Content: Thought content normal.        Judgment: Judgment normal.      UC Treatments / Results  Labs (all labs ordered are listed, but only abnormal results are displayed) Labs Reviewed - No data to display  EKG   Radiology No results found.  Procedures Procedures (including critical care time)  Medications Ordered in UC Medications - No data to display  Initial Impression / Assessment and Plan / UC Course  I have reviewed the triage vital signs and the nursing notes.  Pertinent labs & imaging results that were available during my care of the patient were  reviewed by me and considered in my medical decision making (see chart for details).     Exam showing bilateral middle ear effusions most likely causing patient's ear discomfort and dizziness.  Given exam of ears, do not think that any further work-up is needed for dizziness given that both symptoms started at the same time and dizziness is most likely related to ear abnormality.  Given significance of ear physical exam, will opt to treat with prednisone steroid.  Do not note any obvious contraindications to prednisone on exam and patient's history.  Patient states that she does not take any daily medications so no contraindications to prednisone.  Patient to follow-up if symptoms persist or worsen.  Patient verbalized understanding and was agreeable with plan. Final Clinical Impressions(s) / UC Diagnoses   Final diagnoses:  Fluid level behind tympanic membrane of both ears     Discharge Instructions      You have fluid behind your eardrum which is being treated with prednisone.  Please follow-up if symptoms persist or worsen.    ED Prescriptions     Medication Sig Dispense Auth. Provider   predniSONE (DELTASONE) 20 MG tablet Take 2 tablets (40 mg total) by mouth daily for 5 days. 10 tablet Teodora Medici, [place]      PDMP not reviewed this encounter.   Teodora Medici, [place] 07/29/22 1204

## 2022-07-29 NOTE — Discharge Instructions (Signed)
You have fluid behind your eardrum which is being treated with prednisone.  Please follow-up if symptoms persist or worsen.

## 2022-08-10 ENCOUNTER — Encounter (HOSPITAL_COMMUNITY): Payer: Self-pay | Admitting: *Deleted

## 2022-08-10 ENCOUNTER — Ambulatory Visit (HOSPITAL_COMMUNITY)
Admission: EM | Admit: 2022-08-10 | Discharge: 2022-08-10 | Disposition: A | Discharge status: Home / Self Care | Payer: BC Managed Care – PPO | Attending: Internal Medicine | Admitting: Internal Medicine

## 2022-08-10 DIAGNOSIS — Z113 Encounter for screening for infections with a predominantly sexual mode of transmission: Secondary | ICD-10-CM | POA: Diagnosis not present

## 2022-08-10 DIAGNOSIS — B9689 Other specified bacterial agents as the cause of diseases classified elsewhere: Secondary | ICD-10-CM

## 2022-08-10 DIAGNOSIS — N760 Acute vaginitis: Secondary | ICD-10-CM | POA: Diagnosis not present

## 2022-08-10 DIAGNOSIS — A5901 Trichomonal vulvovaginitis: Secondary | ICD-10-CM | POA: Insufficient documentation

## 2022-08-10 DIAGNOSIS — A599 Trichomoniasis, unspecified: Secondary | ICD-10-CM | POA: Diagnosis not present

## 2022-08-10 DIAGNOSIS — Z3202 Encounter for pregnancy test, result negative: Secondary | ICD-10-CM | POA: Diagnosis not present

## 2022-08-10 DIAGNOSIS — N898 Other specified noninflammatory disorders of vagina: Secondary | ICD-10-CM | POA: Insufficient documentation

## 2022-08-10 LAB — POCT URINALYSIS DIPSTICK, ED / UC
Bilirubin Urine: NEGATIVE
Glucose, UA: NEGATIVE mg/dL
Hgb urine dipstick: NEGATIVE
Leukocytes,Ua: NEGATIVE
Nitrite: NEGATIVE
Protein, ur: NEGATIVE mg/dL
Specific Gravity, Urine: >=1.03 (ref 1.005–1.030)
Urobilinogen, UA: 0.2 mg/dL (ref 0.0–1.0)
pH: 5.5 (ref 5.0–8.0)

## 2022-08-10 LAB — POC URINE PREG, ED: Preg Test, Ur: NEGATIVE

## 2022-08-10 NOTE — ED Triage Notes (Signed)
Pt states that she has vaginal discomfort no discharge, no itching she states that she feels like the discomfort is inside the vaginal X 4 days.

## 2022-08-10 NOTE — Discharge Instructions (Signed)
Please purchase a water-based lubricant free of chemicals or dyes and use this during sexual intercourse to prevent vaginal irritation in the future.  Your urine shows that you are dehydrated but is otherwise normal and without signs of urinary tract infection.  Your pregnancy test is negative.  Your STD testing has been sent to the lab and will come back in the next 2 to 3 days.  We will call you if any of your results are positive requiring treatment and treat you at that time.   Avoid sexual intercourse until your STD results come back.  If any of your STD results are positive, you will need to avoid sexual intercourse for 7 days while you are being treated to prevent spread of STD.  Condom use is the best way to prevent spread of STDs.  Return to urgent care as needed.

## 2022-08-10 NOTE — ED Provider Notes (Signed)
[facility]    CSN: [id number] Arrival date & time: 08/10/22  [id number]      History   Chief Complaint Chief Complaint  Patient presents with   vaginal discomfort    HPI Teresa Harrington is a 26 y.o. female.   Patient presents to urgent care for vaginal irritation that has been presents to "one spot on the inside" of the vagina for the last 4 days.  Irritation sensation comes and goes randomly. She has had BV and vaginal yeast infection in the past and states this doesn't feel the same. Denies dyspareunia, vaginal itching, vaginal odor, and vaginal discharge. States she just finished her menstrual cycle a couple days ago and having tampon in place actually helped with the irritation. When she removed the tampon, the irritation would return. Denies rash to the genitourinary area and history of genital HSV. Reports recent new sexual partner, but states this was protected with condom use.  Denies use of extra lubrication during intercourse.  Denies dysuria, urinary frequency, and urgency. No fever/chills, abdominal pain, nausea, vomiting, diarrhea, constipation, or new low back pain.  She has not attempted use of any over-the-counter medications prior to arrival urgent care for her symptoms.     Past Medical History:  Diagnosis Date   Medical history non-contributory     Patient Active Problem List   Diagnosis Date Noted   Low grade squamous intraepithelial lesion (LGSIL) on cervical Pap smear 12/13/2021   Intentional acetaminophen overdose ([place]) 05/14/2021   Adjustment disorder 05/14/2021   VISUAL ACUITY, DECREASED, RIGHT EYE 12/27/2007   ASTHMA, UNSPECIFIED 01/17/2007    Past Surgical History:  Procedure Laterality Date   NO PAST SURGERIES      OB History     Gravida  1   Para  1   Term  1   Preterm      AB      Living  1      SAB      IAB      Ectopic      Multiple  0   Live Births  1            Home Medications    Prior to Admission  medications   Not on File    Family History Family History  Problem Relation Age of Onset   Healthy Mother    Healthy Father     Social History Social History   Tobacco Use   Smoking status: Never   Smokeless tobacco: Never  Vaping Use   Vaping Use: Never used  Substance Use Topics   Alcohol use: No   Drug use: No     Allergies   Tramadol and Other   Review of Systems Review of Systems Per HPI  Physical Exam Triage Vital Signs ED Triage Vitals  Enc Vitals Group     BP 08/10/22 1114 112/81     Pulse Rate 08/10/22 1114 (!) 54     Resp 08/10/22 1114 18     Temp 08/10/22 1114 98.6 F (37 C)     Temp Source 08/10/22 1114 Oral     SpO2 08/10/22 1114 99 %     Weight --      Height --      Head Circumference --      Peak Flow --      Pain Score 08/10/22 1113 0     Pain Loc --      Pain Edu? --  Excl. in [place]? --    No data found.  Updated Vital Signs BP 112/81 (BP Location: Right Arm)   Pulse (!) 54   Temp 98.6 F (37 C) (Oral)   Resp 18   LMP 08/03/2022 (Approximate)   SpO2 99%   Visual Acuity Right Eye Distance:   Left Eye Distance:   Bilateral Distance:    Right Eye Near:   Left Eye Near:    Bilateral Near:     Physical Exam Vitals and nursing note reviewed.  Constitutional:      Appearance: Normal appearance. She is not ill-appearing or toxic-appearing.     Comments: Very pleasant patient sitting on exam in position of comfort table in no acute distress.   HENT:     Head: Normocephalic and atraumatic.     Right Ear: Hearing and external ear normal.     Left Ear: Hearing and external ear normal.     Nose: Nose normal.     Mouth/Throat:     Lips: Pink.     Mouth: Mucous membranes are moist.  Eyes:     General: Lids are normal. Vision grossly intact. Gaze aligned appropriately.     Extraocular Movements: Extraocular movements intact.     Conjunctiva/sclera: Conjunctivae normal.  Cardiovascular:     Heart sounds: S1 normal and S2  normal.  Pulmonary:     Effort: Pulmonary effort is normal.     Breath sounds: Normal air entry.  Abdominal:     Palpations: Abdomen is soft.  Genitourinary:    Comments: Deferred. Musculoskeletal:     Cervical back: Neck supple.  Skin:    General: Skin is warm and dry.     Capillary Refill: Capillary refill takes less than 2 seconds.     Findings: No rash.  Neurological:     General: No focal deficit present.     Mental Status: She is alert and oriented to person, place, and time. Mental status is at baseline.     Cranial Nerves: No dysarthria or facial asymmetry.     Gait: Gait is intact.  Psychiatric:        Mood and Affect: Mood normal.        Speech: Speech normal.        Behavior: Behavior normal.        Thought Content: Thought content normal.        Judgment: Judgment normal.      UC Treatments / Results  Labs (all labs ordered are listed, but only abnormal results are displayed) Labs Reviewed  POCT URINALYSIS DIPSTICK, ED / UC - Abnormal; Notable for the following components:      Result Value   Ketones, ur TRACE (*)    All other components within normal limits  POC URINE PREG, ED  CERVICOVAGINAL ANCILLARY ONLY    EKG   Radiology No results found.  Procedures Procedures (including critical care time)  Medications Ordered in UC Medications - No data to display  Initial Impression / Assessment and Plan / UC Course  I have reviewed the triage vital signs and the nursing notes.  Pertinent labs & imaging results that were available during my care of the patient were reviewed by me and considered in my medical decision making (see chart for details).   1.  Vaginal irritation and screen for STD Symptoms are likely due to friction injury during sexual intercourse without adequate lubricant.  Shared decision making used to defer genitourinary exam at today's visit.  Advised patient to purchase a water-based lubricant free of chemicals or dyes and uses during  sexual intercourse to prevent future vaginal irritation.  She may also apply a thin layer of Aquaphor to the vaginal area to assist with healing and discomfort.  Tylenol may be used every 6 hours as needed for pain.   STI labs pending.  Patient declines HIV and syphilis testing today.  Will notify patient of positive results and treat accordingly when labs come back.  Patient to avoid sexual intercourse until screening testing comes back.  Education provided regarding safe sexual practices and patient encouraged to use protection to prevent spread of STIs.   Urine pregnancy test is negative.  Urinalysis is negative for evidence of urinary tract infection but shows dehydration.  Encourage patient to increase water intake and avoid use of her urinary irritants.  Discussed physical exam and available lab work findings in clinic with patient.  Counseled patient regarding appropriate use of medications and potential side effects for all medications recommended or prescribed today. Discussed red flag signs and symptoms of worsening condition,when to call the PCP office, return to urgent care, and when to seek higher level of care in the emergency department. Patient verbalizes understanding and agreement with plan. All questions answered. Patient discharged in stable condition.      Final Clinical Impressions(s) / UC Diagnoses   Final diagnoses:  Vaginal irritation  Screen for STD (sexually transmitted disease)  Negative pregnancy test     Discharge Instructions      Please purchase a water-based lubricant free of chemicals or dyes and use this during sexual intercourse to prevent vaginal irritation in the future.  Your urine shows that you are dehydrated but is otherwise normal and without signs of urinary tract infection.  Your pregnancy test is negative.  Your STD testing has been sent to the lab and will come back in the next 2 to 3 days.  We will call you if any of your results are  positive requiring treatment and treat you at that time.   Avoid sexual intercourse until your STD results come back.  If any of your STD results are positive, you will need to avoid sexual intercourse for 7 days while you are being treated to prevent spread of STD.  Condom use is the best way to prevent spread of STDs.  Return to urgent care as needed.       ED Prescriptions   None    PDMP not reviewed this encounter.   Talbot Grumbling,  08/10/22 1234

## 2022-08-11 ENCOUNTER — Telehealth (HOSPITAL_COMMUNITY): Payer: Self-pay

## 2022-08-11 ENCOUNTER — Ambulatory Visit (HOSPITAL_COMMUNITY)
Admission: EM | Admit: 2022-08-11 | Discharge: 2022-08-11 | Disposition: A | Discharge status: Home / Self Care | Payer: BC Managed Care – PPO | Attending: Family Medicine | Admitting: Family Medicine

## 2022-08-11 ENCOUNTER — Encounter (HOSPITAL_COMMUNITY): Payer: Self-pay | Admitting: Emergency Medicine

## 2022-08-11 DIAGNOSIS — B9689 Other specified bacterial agents as the cause of diseases classified elsewhere: Secondary | ICD-10-CM | POA: Diagnosis not present

## 2022-08-11 DIAGNOSIS — N760 Acute vaginitis: Secondary | ICD-10-CM | POA: Diagnosis not present

## 2022-08-11 DIAGNOSIS — N898 Other specified noninflammatory disorders of vagina: Secondary | ICD-10-CM

## 2022-08-11 LAB — CERVICOVAGINAL ANCILLARY ONLY
Bacterial Vaginitis (gardnerella): POSITIVE — AB
Candida Glabrata: NEGATIVE
Candida Vaginitis: NEGATIVE
Chlamydia: NEGATIVE
Comment: NEGATIVE
Comment: NEGATIVE
Comment: NEGATIVE
Comment: NEGATIVE
Comment: NEGATIVE
Comment: NORMAL
Neisseria Gonorrhea: NEGATIVE
Trichomonas: POSITIVE — AB

## 2022-08-11 MED ORDER — METRONIDAZOLE 500 MG PO TABS: 500.0000 mg | ORAL_TABLET | Freq: Two times a day (BID) | ORAL | 0 refills | Status: DC

## 2022-08-11 NOTE — Discharge Instructions (Signed)
Based on your exam today I am treated you with flagyl for presumed bacterial vaginosis.  Your swab should be resulted later today.  I do recommend over the counter desitin for your irritation as well.  Please return if not improving or worsening.

## 2022-08-11 NOTE — ED Provider Notes (Signed)
[place]    CSN: [id number] Arrival date & time: 08/11/22  [id number]      History   Chief Complaint Chief Complaint  Patient presents with   Vaginal Itching    HPI Chantilly Spilker is a 26 y.o. female.   Patient is here for continued vaginal irritation.  She was seen here yesterday.  Urinalysis negative, neg preg test.  Cyto screen pending.  No treatment given.  Given history was thought to be due to vaginal irritation/dryness during intercourse, and was recommended to use a lubricant during intercourse.  She has been using aquaphor, but the irritation has gotten worse.  She has noted a slight milky white d/c as well.    Past Medical History:  Diagnosis Date   Medical history non-contributory     Patient Active Problem List   Diagnosis Date Noted   Low grade squamous intraepithelial lesion (LGSIL) on cervical Pap smear 12/13/2021   Intentional acetaminophen overdose ([place]) 05/14/2021   Adjustment disorder 05/14/2021   VISUAL ACUITY, DECREASED, RIGHT EYE 12/27/2007   ASTHMA, UNSPECIFIED 01/17/2007    Past Surgical History:  Procedure Laterality Date   NO PAST SURGERIES      OB History     Gravida  1   Para  1   Term  1   Preterm      AB      Living  1      SAB      IAB      Ectopic      Multiple  0   Live Births  1            Home Medications    Prior to Admission medications   Not on File    Family History Family History  Problem Relation Age of Onset   Healthy Mother    Healthy Father     Social History Social History   Tobacco Use   Smoking status: Never   Smokeless tobacco: Never  Vaping Use   Vaping Use: Never used  Substance Use Topics   Alcohol use: No   Drug use: No     Allergies   Tramadol and Other   Review of Systems Review of Systems  Constitutional: Negative.   HENT: Negative.    Respiratory: Negative.    Cardiovascular: Negative.   Gastrointestinal: Negative.   Genitourinary:  Positive  for vaginal discharge and vaginal pain.  Musculoskeletal: Negative.   Hematological: Negative.   Psychiatric/Behavioral: Negative.       Physical Exam Triage Vital Signs ED Triage Vitals  Enc Vitals Group     BP 08/11/22 0825 121/72     Pulse Rate 08/11/22 0825 74     Resp 08/11/22 0825 16     Temp 08/11/22 0825 98.5 F (36.9 C)     Temp src --      SpO2 08/11/22 0825 100 %     Weight --      Height --      Head Circumference --      Peak Flow --      Pain Score 08/11/22 0823 0     Pain Loc --      Pain Edu? --      Excl. in [place]? --    No data found.  Updated Vital Signs BP 121/72 (BP Location: Left Arm)   Pulse 74   Temp 98.5 F (36.9 C)   Resp 16   LMP 08/03/2022 (Approximate)   SpO2  100%   Visual Acuity Right Eye Distance:   Left Eye Distance:   Bilateral Distance:    Right Eye Near:   Left Eye Near:    Bilateral Near:     Physical Exam Constitutional:      Appearance: Normal appearance.  Genitourinary:    Comments: Slight thin white vaginal d/c at the introitus;  No rashes or lesion noted otherwise.  On speculum exam there is milky white d/c noted;  this is uncomfortable for the patient, slightly tearful Neurological:     General: No focal deficit present.     Mental Status: She is alert.  Psychiatric:        Mood and Affect: Mood normal.      UC Treatments / Results  Labs (all labs ordered are listed, but only abnormal results are displayed) Labs Reviewed - No data to display  EKG   Radiology No results found.  Procedures Procedures (including critical care time)  Medications Ordered in UC Medications - No data to display  Initial Impression / Assessment and Plan / UC Course  I have reviewed the triage vital signs and the nursing notes.  Pertinent labs & imaging results that were available during my care of the patient were reviewed by me and considered in my medical decision making (see chart for details).    Final Clinical  Impressions(s) / UC Diagnoses   Final diagnoses:  Vaginal irritation  Bacterial vaginosis     Discharge Instructions      Based on your exam today I am treated you with flagyl for presumed bacterial vaginosis.  Your swab should be resulted later today.  I do recommend over the counter desitin for your irritation as well.  Please return if not improving or worsening.     ED Prescriptions     Medication Sig Dispense Auth. Provider   metroNIDAZOLE (FLAGYL) 500 MG tablet Take 1 tablet (500 mg total) by mouth 2 (two) times daily. 14 tablet Rondel Oh, MD      PDMP not reviewed this encounter.   Rondel Oh, MD 08/11/22 [phone]

## 2022-08-11 NOTE — ED Triage Notes (Signed)
Pt was seen here yesterday for her vaginal irritation and did self swab and waiting on results. Urine was good. Wants an exam today bc symptoms have gotten worse and wait was long yesterday so declined pelvic exam.

## 2022-08-13 ENCOUNTER — Encounter: Payer: Self-pay | Admitting: Family Medicine

## 2022-08-18 ENCOUNTER — Ambulatory Visit: Payer: Medicaid Other

## 2022-09-08 ENCOUNTER — Ambulatory Visit: Payer: BC Managed Care – PPO

## 2022-10-09 ENCOUNTER — Ambulatory Visit (INDEPENDENT_AMBULATORY_CARE_PROVIDER_SITE_OTHER): Payer: Medicaid Other

## 2022-10-09 ENCOUNTER — Other Ambulatory Visit (HOSPITAL_COMMUNITY)
Admission: RE | Admit: 2022-10-09 | Discharge: 2022-10-09 | Disposition: A | Discharge status: Home / Self Care | Payer: Medicaid Other | Source: Ambulatory Visit | Attending: Obstetrics and Gynecology | Admitting: Obstetrics and Gynecology

## 2022-10-09 VITALS — BP 127/72 | HR 72 | Wt 200.0 lb

## 2022-10-09 DIAGNOSIS — N926 Irregular menstruation, unspecified: Secondary | ICD-10-CM

## 2022-10-09 DIAGNOSIS — N898 Other specified noninflammatory disorders of vagina: Secondary | ICD-10-CM

## 2022-10-09 LAB — POCT URINE PREGNANCY: Preg Test, Ur: NEGATIVE

## 2022-10-09 NOTE — Progress Notes (Signed)
SUBJECTIVE:  26 y.o. female complains of yellow vaginal discharge and itching for 2 week(s). Denies abnormal vaginal bleeding or significant pelvic pain or fever. No UTI symptoms. Denies history of known exposure to STD.  No LMP recorded (lmp unknown).  OBJECTIVE:  She appears well, afebrile. Urine dipstick: not done.  ASSESSMENT:  Vaginal Discharge  Vaginal Odor   PLAN:  GC, chlamydia, trichomonas, BVAG, CVAG probe sent to lab. Treatment: To be determined once lab results are received ROV prn if symptoms persist or worsen.

## 2022-10-10 ENCOUNTER — Other Ambulatory Visit: Payer: Self-pay | Admitting: Obstetrics and Gynecology

## 2022-10-10 DIAGNOSIS — N760 Acute vaginitis: Secondary | ICD-10-CM

## 2022-10-10 LAB — CERVICOVAGINAL ANCILLARY ONLY
Bacterial Vaginitis (gardnerella): POSITIVE — AB
Candida Glabrata: NEGATIVE
Candida Vaginitis: NEGATIVE
Chlamydia: NEGATIVE
Comment: NEGATIVE
Comment: NEGATIVE
Comment: NEGATIVE
Comment: NEGATIVE
Comment: NEGATIVE
Comment: NORMAL
Neisseria Gonorrhea: NEGATIVE
Trichomonas: NEGATIVE

## 2022-10-10 MED ORDER — METRONIDAZOLE 0.75 % VA GEL: 1.0000 | Freq: Every day | VAGINAL | 1 refills | Status: DC

## 2022-10-20 ENCOUNTER — Ambulatory Visit (HOSPITAL_COMMUNITY)
Admission: EM | Admit: 2022-10-20 | Discharge: 2022-10-20 | Disposition: A | Discharge status: Home / Self Care | Payer: BC Managed Care – PPO | Attending: Emergency Medicine | Admitting: Emergency Medicine

## 2022-10-20 ENCOUNTER — Encounter (HOSPITAL_COMMUNITY): Payer: Self-pay

## 2022-10-20 DIAGNOSIS — N898 Other specified noninflammatory disorders of vagina: Secondary | ICD-10-CM | POA: Diagnosis not present

## 2022-10-20 NOTE — ED Triage Notes (Signed)
Pt is currently [redacted] weeks pregnant. She reports vaginal discharge for several.

## 2022-10-20 NOTE — ED Provider Notes (Signed)
[place]    CSN: [id number] Arrival date & time: 10/20/22  1220     History   Chief Complaint Chief Complaint  Patient presents with   Vaginal Itching    HPI Teresa Harrington is a 26 y.o. female.  Presents with vaginal discharge x 2 days Maybe some odor Denies any abdominal pain or cramping Denies vaginal bleeding or passage of materials No urinary symptoms   She is currently [redacted] weeks pregnant, does not plan to continue pregnancy. Has follow up scheduled   Past Medical History:  Diagnosis Date   Medical history non-contributory     Patient Active Problem List   Diagnosis Date Noted   Low grade squamous intraepithelial lesion (LGSIL) on cervical Pap smear 12/13/2021   Intentional acetaminophen overdose ([place]) 05/14/2021   Adjustment disorder 05/14/2021   VISUAL ACUITY, DECREASED, RIGHT EYE 12/27/2007   ASTHMA, UNSPECIFIED 01/17/2007    Past Surgical History:  Procedure Laterality Date   NO PAST SURGERIES      OB History     Gravida  1   Para  1   Term  1   Preterm      AB      Living  1      SAB      IAB      Ectopic      Multiple  0   Live Births  1            Home Medications    Prior to Admission medications   Medication Sig Start Date End Date Taking? Authorizing Provider  metroNIDAZOLE (FLAGYL) 500 MG tablet Take 1 tablet (500 mg total) by mouth 2 (two) times daily. 08/11/22   Rondel Oh, MD    Family History Family History  Problem Relation Age of Onset   Healthy Mother    Healthy Father     Social History Social History   Tobacco Use   Smoking status: Never   Smokeless tobacco: Never  Vaping Use   Vaping Use: Never used  Substance Use Topics   Alcohol use: No   Drug use: No     Allergies   Tramadol and Other   Review of Systems Review of Systems As per HPI  Physical Exam Triage Vital Signs ED Triage Vitals [10/20/22 1347]  Enc Vitals Group     BP 135/81     Pulse Rate 97     Resp  18     Temp 98.7 F (37.1 C)     Temp Source Oral     SpO2 98 %     Weight      Height      Head Circumference      Peak Flow      Pain Score      Pain Loc      Pain Edu?      Excl. in [place]?    No data found.  Updated Vital Signs BP 135/81 (BP Location: Left Arm)   Pulse 97   Temp 98.7 F (37.1 C) (Oral)   Resp 18   SpO2 98%    Physical Exam Vitals and nursing note reviewed.  Constitutional:      General: She is not in acute distress.    Appearance: Normal appearance.  HENT:     Mouth/Throat:     Pharynx: Oropharynx is clear.  Cardiovascular:     Rate and Rhythm: Normal rate and regular rhythm.     Pulses: Normal pulses.  Pulmonary:     Effort: Pulmonary effort is normal.  Abdominal:     Tenderness: There is no abdominal tenderness.  Neurological:     Mental Status: She is alert and oriented to person, place, and time.      UC Treatments / Results  Labs (all labs ordered are listed, but only abnormal results are displayed) Labs Reviewed  CERVICOVAGINAL ANCILLARY ONLY    EKG   Radiology No results found.  Procedures Procedures (including critical care time)  Medications Ordered in UC Medications - No data to display  Initial Impression / Assessment and Plan / UC Course  I have reviewed the triage vital signs and the nursing notes.  Pertinent labs & imaging results that were available during my care of the patient were reviewed by me and considered in my medical decision making (see chart for details).  Cytology swab pending Discussed waiting for results and treating as needed Return precautions discussed. Patient agrees to plan  Final Clinical Impressions(s) / UC Diagnoses   Final diagnoses:  Vaginal discharge     Discharge Instructions      We will call you if anything on your swab returns positive. Please abstain from sexual intercourse until your results return.     ED Prescriptions   None    PDMP not reviewed this  encounter.   Marlow Baars, [place] 10/20/22 1424

## 2022-10-20 NOTE — Discharge Instructions (Signed)
We will call you if anything on your swab returns positive. Please abstain from sexual intercourse until your results return. 

## 2022-10-23 LAB — CERVICOVAGINAL ANCILLARY ONLY
Bacterial Vaginitis (gardnerella): POSITIVE — AB
Candida Glabrata: NEGATIVE
Candida Vaginitis: NEGATIVE
Chlamydia: NEGATIVE
Comment: NEGATIVE
Comment: NEGATIVE
Comment: NEGATIVE
Comment: NEGATIVE
Comment: NEGATIVE
Comment: NORMAL
Neisseria Gonorrhea: NEGATIVE
Trichomonas: NEGATIVE

## 2022-10-24 ENCOUNTER — Telehealth (HOSPITAL_COMMUNITY): Payer: Self-pay | Admitting: Emergency Medicine

## 2022-10-24 MED ORDER — METRONIDAZOLE 0.75 % VA GEL: 1.0000 | Freq: Every day | VAGINAL | 0 refills | Status: AC

## 2022-12-12 ENCOUNTER — Other Ambulatory Visit (HOSPITAL_COMMUNITY)
Admission: RE | Admit: 2022-12-12 | Discharge: 2022-12-12 | Disposition: A | Discharge status: Home / Self Care | Payer: BC Managed Care – PPO | Source: Ambulatory Visit | Attending: Family Medicine | Admitting: Family Medicine

## 2022-12-12 ENCOUNTER — Ambulatory Visit (INDEPENDENT_AMBULATORY_CARE_PROVIDER_SITE_OTHER): Payer: BC Managed Care – PPO

## 2022-12-12 VITALS — BP 128/72 | HR 70 | Ht 65.0 in | Wt 186.6 lb

## 2022-12-12 DIAGNOSIS — Z202 Contact with and (suspected) exposure to infections with a predominantly sexual mode of transmission: Secondary | ICD-10-CM

## 2022-12-12 NOTE — Progress Notes (Signed)
Teresa Harrington is here with concern of HSV and other STDs. Patient reports no symptoms; patient expressed having unprotected intercourse around 10/26/22 with a partner who later informed the patient that he had had sex with someone else that recently tested positive for HSV.  To clarify, the partner had sex with someone who contracted HSV but only found out about the HSV 2 weeks ago, and in the time between then and now, the patient had sex with that partner. Patient also requested to do self swab STD screening.   Patient reports she hasn't tried anything for symptoms because she hasn't had any symptoms.    Pertinent history: IAB on 11/02/22 at 6w GA.   Plan of care:  Explained to patient that the only way to test for active HSV is to swab an active/open sore or lesionn; explained to patient that testing blood for HSV would only tell us if the patient has ever been exposed to HSV at some point in her life. Patient verbalized understanding and opted to only do self-swab today for other STD screening, and will continue to monitor for sores/lesions and other symptoms of HSV at home.  Self swab instructions given and specimen obtained. Explained patient will be contacted with any abnormal results.  Patient is due for annual exam; offered for patient to schedule during checkout.  Alinda Money, RN 12/12/2022  9:00 AM

## 2022-12-13 LAB — CERVICOVAGINAL ANCILLARY ONLY
Bacterial Vaginitis (gardnerella): POSITIVE — AB
Candida Glabrata: NEGATIVE
Candida Vaginitis: NEGATIVE
Chlamydia: NEGATIVE
Comment: NEGATIVE
Comment: NEGATIVE
Comment: NEGATIVE
Comment: NEGATIVE
Comment: NEGATIVE
Comment: NORMAL
Neisseria Gonorrhea: NEGATIVE
Trichomonas: NEGATIVE

## 2022-12-14 ENCOUNTER — Other Ambulatory Visit: Payer: Self-pay | Admitting: Obstetrics and Gynecology

## 2022-12-14 DIAGNOSIS — N760 Acute vaginitis: Secondary | ICD-10-CM

## 2022-12-14 MED ORDER — METRONIDAZOLE 500 MG PO TABS: 500.0000 mg | ORAL_TABLET | Freq: Two times a day (BID) | ORAL | 0 refills | Status: DC

## 2023-01-03 ENCOUNTER — Ambulatory Visit (INDEPENDENT_AMBULATORY_CARE_PROVIDER_SITE_OTHER): Payer: Medicaid Other

## 2023-01-03 ENCOUNTER — Other Ambulatory Visit (HOSPITAL_COMMUNITY)
Admission: RE | Admit: 2023-01-03 | Discharge: 2023-01-03 | Disposition: A | Discharge status: Home / Self Care | Payer: Medicaid Other | Source: Ambulatory Visit | Attending: Family Medicine | Admitting: Family Medicine

## 2023-01-03 ENCOUNTER — Encounter: Payer: Self-pay | Admitting: Certified Nurse Midwife

## 2023-01-03 VITALS — BP 114/74 | HR 81 | Wt 196.0 lb

## 2023-01-03 DIAGNOSIS — B9689 Other specified bacterial agents as the cause of diseases classified elsewhere: Secondary | ICD-10-CM

## 2023-01-03 DIAGNOSIS — Z113 Encounter for screening for infections with a predominantly sexual mode of transmission: Secondary | ICD-10-CM

## 2023-01-03 DIAGNOSIS — B379 Candidiasis, unspecified: Secondary | ICD-10-CM

## 2023-01-03 NOTE — Progress Notes (Signed)
Patient presents for STI screening.  Norleen Xie l Linell Meldrum, CMA

## 2023-01-04 LAB — CERVICOVAGINAL ANCILLARY ONLY
Bacterial Vaginitis (gardnerella): POSITIVE — AB
Candida Glabrata: POSITIVE — AB
Candida Vaginitis: NEGATIVE
Chlamydia: NEGATIVE
Comment: NEGATIVE
Comment: NEGATIVE
Comment: NEGATIVE
Comment: NEGATIVE
Comment: NEGATIVE
Comment: NORMAL
Neisseria Gonorrhea: NEGATIVE
Trichomonas: NEGATIVE

## 2023-01-04 MED ORDER — FLUCONAZOLE 150 MG PO TABS: 150.0000 mg | ORAL_TABLET | Freq: Once | ORAL | 0 refills | Status: AC

## 2023-01-05 MED ORDER — METRONIDAZOLE 500 MG PO TABS: 500.0000 mg | ORAL_TABLET | Freq: Two times a day (BID) | ORAL | 0 refills | Status: DC

## 2023-01-05 MED ORDER — FLUCONAZOLE 150 MG PO TABS
150.0000 mg | ORAL_TABLET | ORAL | 3 refills | Status: DC
Start: 2023-01-05 — End: 2023-02-07

## 2023-01-05 NOTE — Addendum Note (Signed)
Addended by: Truett Mainland on: 01/05/2023 12:22 PM   Modules accepted: Orders

## 2023-01-26 ENCOUNTER — Encounter: Payer: Self-pay | Admitting: Certified Nurse Midwife

## 2023-01-26 ENCOUNTER — Ambulatory Visit (INDEPENDENT_AMBULATORY_CARE_PROVIDER_SITE_OTHER): Payer: BC Managed Care – PPO | Admitting: Certified Nurse Midwife

## 2023-01-26 ENCOUNTER — Other Ambulatory Visit (HOSPITAL_COMMUNITY)
Admission: RE | Admit: 2023-01-26 | Discharge: 2023-01-26 | Disposition: A | Discharge status: Home / Self Care | Payer: BC Managed Care – PPO | Source: Ambulatory Visit | Attending: Certified Nurse Midwife | Admitting: Certified Nurse Midwife

## 2023-01-26 ENCOUNTER — Other Ambulatory Visit: Payer: Self-pay

## 2023-01-26 VITALS — BP 130/81 | HR 100 | Ht 65.0 in | Wt 188.2 lb

## 2023-01-26 DIAGNOSIS — Z01419 Encounter for gynecological examination (general) (routine) without abnormal findings: Secondary | ICD-10-CM | POA: Diagnosis not present

## 2023-01-26 DIAGNOSIS — Z113 Encounter for screening for infections with a predominantly sexual mode of transmission: Secondary | ICD-10-CM

## 2023-01-26 DIAGNOSIS — Z3009 Encounter for other general counseling and advice on contraception: Secondary | ICD-10-CM

## 2023-01-27 ENCOUNTER — Encounter: Payer: Self-pay | Admitting: Certified Nurse Midwife

## 2023-01-27 LAB — RPR: RPR Ser Ql: NONREACTIVE

## 2023-01-27 LAB — HEPATITIS C ANTIBODY: Hep C Virus Ab: NONREACTIVE

## 2023-01-27 LAB — HEPATITIS B SURFACE ANTIGEN: Hepatitis B Surface Ag: NEGATIVE

## 2023-01-27 LAB — HIV ANTIBODY (ROUTINE TESTING W REFLEX): HIV Screen 4th Generation wRfx: NONREACTIVE

## 2023-01-29 LAB — CERVICOVAGINAL ANCILLARY ONLY
Bacterial Vaginitis (gardnerella): NEGATIVE
Candida Glabrata: NEGATIVE
Candida Vaginitis: NEGATIVE
Chlamydia: NEGATIVE
Comment: NEGATIVE
Comment: NEGATIVE
Comment: NEGATIVE
Comment: NEGATIVE
Comment: NEGATIVE
Comment: NORMAL
Neisseria Gonorrhea: NEGATIVE
Trichomonas: NEGATIVE

## 2023-01-29 NOTE — Progress Notes (Signed)
GYNECOLOGY OFFICE VISIT NOTE  History:   Teresa Harrington is a 27 y.o. G2P1011 here today for annual well woman visit.. She denies any abnormal vaginal discharge, bleeding, pelvic pain or other concerns. She reports normal periods ever 21-25 days with 5-7 bleed days. She reports her heaviest days are on days 2-3. She reports she is not sexually active at this time. TAB in December of 2023.    Past Medical History:  Diagnosis Date   Medical history non-contributory     Past Surgical History:  Procedure Laterality Date   NO PAST SURGERIES      The following portions of the patient's history were reviewed and updated as appropriate: allergies, current medications, past family history, past medical history, past social history, past surgical history and problem list.   Health Maintenance:  Abnormal pap with LSIL and negative HRHPV on 09/30/2021.    Review of Systems:  Pertinent items noted in HPI and remainder of comprehensive ROS otherwise negative.  Physical Exam:  BP 130/81   Pulse 100   Ht '5\' 5"'$  (1.651 m)   Wt 188 lb 3.2 oz (85.4 kg)   BMI 31.32 kg/m  CONSTITUTIONAL: Well-developed, well-nourished female in no acute distress.  HEENT:  Normocephalic, atraumatic. External right and left ear normal. No scleral icterus.  NECK: Normal range of motion, supple, no masses noted on observation SKIN: No rash noted. Not diaphoretic. No erythema. No pallor. MUSCULOSKELETAL: Normal range of motion. No edema noted. NEUROLOGIC: Alert and oriented to person, place, and time. Normal muscle tone coordination. No cranial nerve deficit noted. PSYCHIATRIC: Normal mood and affect. Normal behavior. Normal judgment and thought content. CARDIOVASCULAR: Normal heart rate noted RESPIRATORY: Effort and breath sounds normal, no problems with respiration noted ABDOMEN: No masses noted. No other overt distention noted.   PELVIC: Normal appearing external genitalia; normal urethral meatus; normal appearing  vaginal mucosa and cervix.  No abnormal discharge noted.  Normal uterine size, no other palpable masses, no uterine or adnexal tenderness. Performed in the presence of a chaperone  Labs and Imaging Results for orders placed or performed in visit on 01/26/23 (from the past 168 hour(s))  Cervicovaginal ancillary only( [place])   Collection Time: 01/26/23 10:53 AM  Result Value Ref Range   Neisseria Gonorrhea Negative    Chlamydia Negative    Trichomonas Negative    Bacterial Vaginitis (gardnerella) Negative    Candida Vaginitis Negative    Candida Glabrata Negative    Comment      Normal Reference Range Bacterial Vaginosis - Negative   Comment Normal Reference Range Candida Species - Negative    Comment Normal Reference Range Candida Galbrata - Negative    Comment Normal Reference Range Trichomonas - Negative    Comment Normal Reference [place] Chlamydia - Negative    Comment      Normal Reference Range Neisseria Gonorrhea - Negative  HIV Antibody (routine testing w rflx)   Collection Time: 01/26/23 11:27 AM  Result Value Ref Range   HIV Screen 4th Generation wRfx Non Reactive Non Reactive  RPR   Collection Time: 01/26/23 11:27 AM  Result Value Ref Range   RPR Ser Ql Non Reactive Non Reactive  Hepatitis B Surface AntiGEN   Collection Time: 01/26/23 11:27 AM  Result Value Ref Range   Hepatitis B Surface Ag Negative Negative  Hepatitis C Antibody   Collection Time: 01/26/23 11:27 AM  Result Value Ref Range   Hep C Virus Ab Non Reactive Non Reactive  No results found.    Assessment and Plan:    1. Well woman exam with routine gynecological exam - Patient request STD testing today.  - Cytology - PAP( [place]) - Cervicovaginal ancillary only( [place]) - HIV Antibody (routine testing w rflx) - RPR - Hepatitis B Surface AntiGEN - Hepatitis C Antibody  2. Screening for STDs (sexually transmitted diseases) - Cervicovaginal ancillary only( ) - HIV  Antibody (routine testing w rflx) - RPR - Hepatitis B Surface AntiGEN - Hepatitis C Antibody  3. Birth control counseling - Patient declines birth control as she is not sexually active. Reviewed that condoms are always an option and discussed that if the occasion [place] and she desires BC to inform the office.  - Patient agreeable to plan of care.    Routine preventative health maintenance measures emphasized. Please refer to After Visit Summary for other counseling recommendations.   No follow-ups on file.    I spent 30 minutes dedicated to the care of this patient including pre-visit review of records, face to face time with the patient discussing her conditions and treatments and post visit orders.    Noelene Gang Isaias Sakai) Rollene Rotunda, MSN, [place] for [place]  01/29/23 11:15 PM

## 2023-02-07 ENCOUNTER — Ambulatory Visit
Admission: EM | Admit: 2023-02-07 | Discharge: 2023-02-07 | Disposition: A | Discharge status: Home / Self Care | Payer: Medicaid Other | Attending: Family Medicine | Admitting: Family Medicine

## 2023-02-07 DIAGNOSIS — J029 Acute pharyngitis, unspecified: Secondary | ICD-10-CM | POA: Diagnosis not present

## 2023-02-07 LAB — POCT RAPID STREP A (OFFICE): Rapid Strep A Screen: NEGATIVE

## 2023-02-07 MED ORDER — KETOROLAC TROMETHAMINE 30 MG/ML IJ SOLN
60.0000 mg | Freq: Once | INTRAMUSCULAR | Status: AC
  Administered 2023-02-07: 60 mg via INTRAMUSCULAR

## 2023-02-07 MED ORDER — CEFDINIR 300 MG PO CAPS: 300.0000 mg | ORAL_CAPSULE | Freq: Two times a day (BID) | ORAL | 0 refills | Status: AC

## 2023-02-07 MED ORDER — KETOROLAC TROMETHAMINE 60 MG/2ML IM SOLN: 60.0000 mg | Freq: Once | INTRAMUSCULAR | Status: DC

## 2023-02-07 MED ORDER — LIDOCAINE VISCOUS HCL 2 % MT SOLN: 5.0000 mL | Freq: Four times a day (QID) | OROMUCOSAL | 0 refills | Status: AC | PRN

## 2023-02-07 NOTE — ED Triage Notes (Signed)
Pt c/o sore throat, hard to talk   Onset ~ yesterday   Pulse ox monitor not picking up a reading in any of the finger 2/2 artificial nails and coloring.

## 2023-02-07 NOTE — Discharge Instructions (Signed)
You may use over the counter ibuprofen or acetaminophen as needed.  °For a sore throat, over the counter products such as [employer]gate Peroxyl Mouth Sore Rinse or Chloraseptic Sore Throat Spray may provide some temporary relief. ° ° ° ° °

## 2023-02-07 NOTE — ED Provider Notes (Signed)
[place]   [id number] 02/07/23 Arrival Time: 0801  ASSESSMENT & PLAN:  1. Sore throat    No signs of peritonsillar abscess. Will treat empirically for strep given exam and discomfort. Discussed. Work note provided.  Meds ordered this encounter  Medications   DISCONTD: ketorolac (TORADOL) injection 60 mg   ketorolac (TORADOL) 30 MG/ML injection 60 mg   cefdinir (OMNICEF) 300 MG capsule    Sig: Take 1 capsule (300 mg total) by mouth 2 (two) times daily.    Dispense:  20 capsule    Refill:  0   magic mouthwash (lidocaine, diphenhydrAMINE, alum & mag hydroxide) suspension    Sig: Swish and spit 5 mLs 4 (four) times daily as needed for mouth pain.    Dispense:  360 mL    Refill:  0    Results for orders placed or performed during the hospital encounter of 02/07/23  POCT rapid strep A  Result Value Ref Range   Rapid Strep A Screen Negative Negative   Labs Reviewed  POCT RAPID STREP A (OFFICE)    Follow-up Information     [facility] at [facility].   Specialty: Emergency Medicine Why: If worsening or failing to improve as anticipated. Contact information: [address] [id number] [place] [phone]                OTC analgesics and throat care as needed  Instructed to finish full 10 day course of antibiotics. Will follow up if not showing significant improvement over the next 24-48 hours.    Discharge Instructions      You may use over the counter ibuprofen or acetaminophen as needed.  For a sore throat, over the counter products such as Colgate Peroxyl Mouth Sore Rinse or Chloraseptic Sore Throat Spray may provide some temporary relief.      Reviewed expectations re: course of current medical issues. Questions answered. Outlined signs and symptoms indicating need for more acute intervention. Patient verbalized understanding. After Visit Summary  given.   SUBJECTIVE:  Jessica Hansen is a 27 y.o. female who reports a sore throat. Describes as sharp pain, esp with swallowing. Onset abrupt beginning yesterday. Symptoms have progressed to a point and plateaued since beginning; without voice changes. No respiratory symptoms. Normal PO intake but reports discomfort with swallowing. No specific alleviating factors. Fever: believed to be present, temp not taken. No neck pain or swelling. No associated nausea, vomiting, or abdominal pain. Ibuprofen last evening without relief.   OBJECTIVE:  Vitals:   02/07/23 0828 02/07/23 0830  BP: 129/82   Pulse:  95  Resp: 20   Temp: 98.6 F (37 C)   TempSrc: Oral   SpO2:  99%    General appearance: alert; no distress HEENT: throat with marked erythema and with exudative tonsillar hypertrophy; uvula is midline Neck: supple with FROM; small bilat cerv LAD Lungs: speaks full sentences without difficulty; unlabored Abd: soft; non-tender Skin: reveals no rash; warm and dry Psychological: alert and cooperative; normal mood and affect  Allergies  Allergen Reactions   Celery Oil Hives    Past Medical History:  Diagnosis Date   Pregnancy induced hypertension    Social History   Socioeconomic History   Marital status: Single    Spouse name: Not on file   Number of children: Not on file   Years of education: Not on file   Highest education level: Not on file  Occupational History   Not  on file  Tobacco Use   Smoking status: Former   Smokeless tobacco: Never  Vaping Use   Vaping Use: Never used  Substance and Sexual Activity   Alcohol use: Not Currently    Comment: social   Drug use: Never   Sexual activity: Not Currently    Birth control/protection: None  Other Topics Concern   Not on file  Social History Narrative   Not on file   Social Determinants of Health   Financial Resource Strain: Not on file  Food Insecurity: Not on file  Transportation Needs: Not on file  Physical  Activity: Not on file  Stress: Not on file  Social Connections: Not on file  Intimate Partner Violence: Not on file   Family History  Problem Relation Age of Onset   Depression Mother    Healthy Father            Vanessa Kick, MD 02/07/23 [phone]

## 2023-02-08 ENCOUNTER — Encounter: Payer: Self-pay | Admitting: *Deleted

## 2023-02-09 ENCOUNTER — Encounter: Payer: Self-pay | Admitting: Certified Nurse Midwife

## 2023-02-09 LAB — CYTOLOGY - PAP
Comment: NEGATIVE
High risk HPV: POSITIVE — AB

## 2023-02-12 ENCOUNTER — Telehealth: Payer: Self-pay | Admitting: Family Medicine

## 2023-02-12 NOTE — Telephone Encounter (Signed)
Patient concern addressed in MyChart message.   Teresa Harrington

## 2023-02-12 NOTE — Telephone Encounter (Signed)
Patient has questions about procedure appt in May

## 2023-02-13 ENCOUNTER — Other Ambulatory Visit: Payer: Self-pay

## 2023-02-13 MED ORDER — FLUCONAZOLE 150 MG PO TABS: 150.0000 mg | ORAL_TABLET | Freq: Once | ORAL | 0 refills | Status: AC

## 2023-02-13 NOTE — Progress Notes (Signed)
Patient called and she was recently given antibiotics for sore throat. Patient states she is now having vaginal itching and would like something for yeast infection. Patient given diflucan per protocol and told to contact us back if symptoms not relieved. Patient stats understanding. Kathrene Alu RN

## 2023-02-22 ENCOUNTER — Telehealth: Payer: Self-pay | Admitting: Family Medicine

## 2023-02-22 NOTE — Telephone Encounter (Signed)
Patient has questions about her upcoming colpo

## 2023-02-22 NOTE — Telephone Encounter (Signed)
Called pt and pt wanted to know if she should come to the appt for her colpo as she has had a colpo before.  I explained to the pt the importance of the colpo to further access the cells on the cervix.  I advised the pt there

## 2023-03-01 ENCOUNTER — Ambulatory Visit
Admission: EM | Admit: 2023-03-01 | Discharge: 2023-03-01 | Disposition: A | Discharge status: Home / Self Care | Payer: BC Managed Care – PPO | Attending: Internal Medicine | Admitting: Internal Medicine

## 2023-03-01 DIAGNOSIS — N898 Other specified noninflammatory disorders of vagina: Secondary | ICD-10-CM | POA: Insufficient documentation

## 2023-03-01 LAB — POCT URINALYSIS DIP (MANUAL ENTRY)
Bilirubin, UA: NEGATIVE
Blood, UA: NEGATIVE
Glucose, UA: NEGATIVE mg/dL
Ketones, POC UA: NEGATIVE mg/dL
Leukocytes, UA: NEGATIVE
Nitrite, UA: NEGATIVE
Protein Ur, POC: NEGATIVE mg/dL
Spec Grav, UA: >=1.03 — AB (ref 1.010–1.025)
Urobilinogen, UA: 0.2 E.U./dL
pH, UA: 6 (ref 5.0–8.0)

## 2023-03-01 LAB — POCT URINE PREGNANCY: Preg Test, Ur: NEGATIVE

## 2023-03-01 MED ORDER — METRONIDAZOLE 500 MG PO TABS: 500.0000 mg | ORAL_TABLET | Freq: Two times a day (BID) | ORAL | 0 refills | Status: DC

## 2023-03-01 NOTE — ED Triage Notes (Signed)
Pt c/o vaginal irritation x2 days. Denies discharge.

## 2023-03-01 NOTE — Discharge Instructions (Addendum)
The clinic will contact you with results of the testing done today if positive. Start metronidazole twice daily for 7 days Please follow-up with your PCP or GYN if symptoms do not improve Please go to the ER for any worsening symptoms

## 2023-03-01 NOTE — ED Provider Notes (Signed)
[facility]    CSN: [id number] Arrival date & time: 03/01/23  6389      History   Chief Complaint Chief Complaint  Patient presents with   vaginal irritation    HPI Teresa Harrington is a 27 y.o. female presents for vaginal irritation x 2 days.  Patient reports vaginal irritation with scant discharge.  Denies any dysuria, vaginal odor, fevers, nausea/vomiting, flank pain.  No STD exposure but would like screening while here.  Reports history of BV infections and states the symptoms are similar.  No OTC medications have been used since onset.  No other concerns at this time.  HPI  Past Medical History:  Diagnosis Date   Medical history non-contributory     Patient Active Problem List   Diagnosis Date Noted   Low grade squamous intraepithelial lesion (LGSIL) on cervical Pap smear 12/13/2021   Intentional acetaminophen overdose 05/14/2021   Adjustment disorder 05/14/2021   VISUAL ACUITY, DECREASED, RIGHT EYE 12/27/2007   ASTHMA, UNSPECIFIED 01/17/2007    Past Surgical History:  Procedure Laterality Date   NO PAST SURGERIES      OB History     Gravida  2   Para  1   Term  1   Preterm      AB  1   Living  1      SAB      IAB  1   Ectopic      Multiple  0   Live Births  1            Home Medications    Prior to Admission medications   Medication Sig Start Date End Date Taking? Authorizing Provider  metroNIDAZOLE (FLAGYL) 500 MG tablet Take 1 tablet (500 mg total) by mouth 2 (two) times daily. 03/01/23  Yes Radford Pax, NP    Family History Family History  Problem Relation Age of Onset   Healthy Mother    Healthy Father     Social History Social History   Tobacco Use   Smoking status: Never   Smokeless tobacco: Never  Vaping Use   Vaping Use: Never used  Substance Use Topics   Alcohol use: No    Comment: occ   Drug use: No     Allergies   Tramadol and Other   Review of Systems Review of Systems   Genitourinary:        Vaginal irritation     Physical Exam Triage Vital Signs ED Triage Vitals  Enc Vitals Group     BP 03/01/23 0940 (!) 135/90     Pulse Rate 03/01/23 0940 91     Resp 03/01/23 0940 17     Temp 03/01/23 0940 99.1 F (37.3 C)     Temp Source 03/01/23 0940 Oral     SpO2 03/01/23 0940 94 %     Weight --      Height --      Head Circumference --      Peak Flow --      Pain Score 03/01/23 0938 0     Pain Loc --      Pain Edu? --      Excl. in GC? --    No data found.  Updated Vital Signs BP (!) 135/90   Pulse 91   Temp 99.1 F (37.3 C) (Oral)   Resp 17   LMP 02/08/2023 (Exact Date)   SpO2 94%   Visual Acuity Right Eye Distance:   Left  Eye Distance:   Bilateral Distance:    Right Eye Near:   Left Eye Near:    Bilateral Near:     Physical Exam Vitals and nursing note reviewed.  Constitutional:      Appearance: Normal appearance.  HENT:     Head: Normocephalic and atraumatic.  Eyes:     Pupils: Pupils are equal, round, and reactive to light.  Cardiovascular:     Rate and Rhythm: Normal rate.  Pulmonary:     Effort: Pulmonary effort is normal.  Abdominal:     Tenderness: There is no right CVA tenderness or left CVA tenderness.  Skin:    General: Skin is warm and dry.  Neurological:     General: No focal deficit present.     Mental Status: She is alert and oriented to person, place, and time.  Psychiatric:        Mood and Affect: Mood normal.        Behavior: Behavior normal.      UC Treatments / Results  Labs (all labs ordered are listed, but only abnormal results are displayed) Labs Reviewed  POCT URINALYSIS DIP (MANUAL ENTRY) - Abnormal; Notable for the following components:      Result Value   Spec Grav, UA >=1.030 (*)    All other components within normal limits  POCT URINE PREGNANCY  CERVICOVAGINAL ANCILLARY ONLY    EKG   Radiology No results found.  Procedures Procedures (including critical care  time)  Medications Ordered in UC Medications - No data to display  Initial Impression / Assessment and Plan / UC Course  I have reviewed the triage vital signs and the nursing notes.  Pertinent labs & imaging results that were available during my care of the patient were reviewed by me and considered in my medical decision making (see chart for details).     UA negative Vaginal swab is ordered and will contact for any results.  Will start Flagyl as patient reports symptoms consistent with BV PCP follow-up if symptoms do not improve ER precautions reviewed and patient verbalized understanding Final Clinical Impressions(s) / UC Diagnoses   Final diagnoses:  Vaginal irritation     Discharge Instructions      The clinic will contact you with results of the testing done today if positive. Start metronidazole twice daily for 7 days Please follow-up with your PCP or GYN if symptoms do not improve Please go to the ER for any worsening symptoms    ED Prescriptions     Medication Sig Dispense Auth. Provider   metroNIDAZOLE (FLAGYL) 500 MG tablet Take 1 tablet (500 mg total) by mouth 2 (two) times daily. 14 tablet Radford Pax, NP      PDMP not reviewed this encounter.   Radford Pax, NP 03/01/23 1000

## 2023-03-02 ENCOUNTER — Telehealth (HOSPITAL_COMMUNITY): Payer: Self-pay | Admitting: Emergency Medicine

## 2023-03-02 LAB — CERVICOVAGINAL ANCILLARY ONLY
Bacterial Vaginitis (gardnerella): POSITIVE — AB
Candida Glabrata: NEGATIVE
Candida Vaginitis: POSITIVE — AB
Chlamydia: NEGATIVE
Comment: NEGATIVE
Comment: NEGATIVE
Comment: NEGATIVE
Comment: NEGATIVE
Comment: NEGATIVE
Comment: NORMAL
Neisseria Gonorrhea: NEGATIVE
Trichomonas: NEGATIVE

## 2023-03-02 MED ORDER — FLUCONAZOLE 150 MG PO TABS: 150.0000 mg | ORAL_TABLET | Freq: Once | ORAL | 0 refills | Status: AC

## 2023-04-09 ENCOUNTER — Ambulatory Visit: Payer: BC Managed Care – PPO | Admitting: Obstetrics & Gynecology

## 2023-04-12 ENCOUNTER — Ambulatory Visit: Payer: Medicaid Other | Admitting: Family Medicine

## 2023-04-15 ENCOUNTER — Encounter (HOSPITAL_COMMUNITY): Payer: Self-pay

## 2023-04-15 ENCOUNTER — Emergency Department (HOSPITAL_COMMUNITY)
Admission: EM | Admit: 2023-04-15 | Discharge: 2023-04-16 | Disposition: A | Discharge status: Home / Self Care | Payer: BC Managed Care – PPO | Attending: Emergency Medicine | Admitting: Emergency Medicine

## 2023-04-15 ENCOUNTER — Other Ambulatory Visit: Payer: Self-pay

## 2023-04-15 DIAGNOSIS — R519 Headache, unspecified: Secondary | ICD-10-CM | POA: Insufficient documentation

## 2023-04-15 DIAGNOSIS — K0889 Other specified disorders of teeth and supporting structures: Secondary | ICD-10-CM | POA: Insufficient documentation

## 2023-04-15 MED ORDER — HYDROCODONE-ACETAMINOPHEN 5-325 MG PO TABS
1.0000 | ORAL_TABLET | Freq: Once | ORAL | Status: AC
Start: 2023-04-16 — End: 2023-04-16
  Administered 2023-04-16: 1 via ORAL
  Filled 2023-04-15: qty 1

## 2023-04-15 MED ORDER — LIDOCAINE VISCOUS HCL 2 % MT SOLN: 15.0000 mL | OROMUCOSAL | 0 refills | Status: DC | PRN

## 2023-04-15 MED ORDER — NAPROXEN 500 MG PO TABS: 500.0000 mg | ORAL_TABLET | Freq: Two times a day (BID) | ORAL | 0 refills | Status: DC

## 2023-04-15 MED ORDER — PENICILLIN V POTASSIUM 500 MG PO TABS: 500.0000 mg | ORAL_TABLET | Freq: Four times a day (QID) | ORAL | 0 refills | Status: DC

## 2023-04-15 NOTE — Discharge Instructions (Addendum)
Take the prescribed medication as directed. Follow-up with dentist-- call their office (may be closed tomorrow due to holiday). Return to the ED for new or worsening symptoms.

## 2023-04-15 NOTE — ED Provider Notes (Signed)
[place] [facility]MENT AT [facility] Provider Note   CSN: [id number] Arrival date & time: 04/15/23  2258     History  Chief Complaint  Patient presents with   Headache    Teresa Harrington is a 27 y.o. female.  The history is provided by the patient and medical records.  Headache  27 year old female with no significant past medical history presenting to the ED with 3 weeks of right-sided dental pain and headache.  She has broken right upper molars for the past few months but no issues until recently.  States tooth is incredibly sensitive to hot and cold food/liquid, air, or when chewing on affected side.  States is a sharp, searing pain on the right side of her face along with a dull, aching sensation in the teeth.  She has not had any fever or chills.  No difficulty swallowing.  She did take 1 tablet of leftover amoxicillin a few hours ago but has not had any change.  She also tried some over-the-counter "filling" to "hole in broken tooth but that is also not helped.  She does not currently have a dentist.  Home Medications Prior to Admission medications   Medication Sig Start Date End Date Taking? Authorizing Provider  lidocaine (XYLOCAINE) 2 % solution Use as directed 15 mLs in the mouth or throat every 4 (four) hours as needed for mouth pain. 04/15/23  Yes Garlon Hatchet, PA-C  naproxen (NAPROSYN) 500 MG tablet Take 1 tablet (500 mg total) by mouth 2 (two) times daily. 04/15/23  Yes Garlon Hatchet, PA-C  penicillin v potassium (VEETID) 500 MG tablet Take 1 tablet (500 mg total) by mouth 4 (four) times daily for 10 days. 04/15/23 04/25/23 Yes Garlon Hatchet, PA-C      Allergies    Tramadol and Other    Review of Systems   Review of Systems  HENT:  Positive for dental problem.   Neurological:  Positive for headaches.  All other systems reviewed and are negative.   Physical Exam Updated Vital Signs BP (!) 142/75 (BP Location: Right Arm)   Pulse 80   Temp  98.7 F (37.1 C) (Oral)   Resp 18   Ht 5\' 5"  (1.651 m)   Wt 80.7 kg   LMP 03/29/2023 (Exact Date)   SpO2 95%   BMI 29.62 kg/m   Physical Exam Vitals and nursing note reviewed.  Constitutional:      Appearance: She is well-developed.  HENT:     Head: Normocephalic and atraumatic.     Mouth/Throat:     Comments: Teeth largely in fair dentition, right upper molars are broken, there is visible temporary filling in place, surrounding gingiva normal in appearance without visible abscess or fluid collection, handling secretions appropriately, no trismus, no facial or neck swelling, normal phonation without stridor Eyes:     Conjunctiva/sclera: Conjunctivae normal.     Pupils: Pupils are equal, round, and reactive to light.  Cardiovascular:     Rate and Rhythm: Normal rate and regular rhythm.     Heart sounds: Normal heart sounds.  Pulmonary:     Effort: Pulmonary effort is normal.     Breath sounds: Normal breath sounds.  Abdominal:     General: Bowel sounds are normal.     Palpations: Abdomen is soft.  Musculoskeletal:        General: Normal range of motion.     Cervical back: Normal range of motion.  Skin:    General:  Skin is warm and dry.  Neurological:     Mental Status: She is alert and oriented to person, place, and time.     ED Results / Procedures / Treatments   Labs (all labs ordered are listed, but only abnormal results are displayed) Labs Reviewed - No data to display  EKG None  Radiology No results found.  Procedures Procedures    Medications Ordered in ED Medications  HYDROcodone-acetaminophen (NORCO/VICODIN) 5-325 MG per tablet 1 tablet (has no administration in time range)    ED Course/ Medical Decision Making/ A&P                             Medical Decision Making Risk Prescription drug management.   27 year old female presenting to the ED with right-sided headache/facial pain.  This does appear to be from broken right upper molars.  From  the sounds of her symptoms she likely has exposed nerve.  She does not have any signs of acute abscess formation.  No facial or neck swelling, handling secretions well, no stridor.  No signs or symptoms concerning for Ludwig's angina.  Will start on antibiotics and medications for pain control, referred to dentist for follow-up.  She can return here for any new or acute changes.  Final Clinical Impression(s) / ED Diagnoses Final diagnoses:  Pain, dental    Rx / DC Orders ED Discharge Orders          Ordered    naproxen (NAPROSYN) 500 MG tablet  2 times daily        04/15/23 2347    penicillin v potassium (VEETID) 500 MG tablet  4 times daily        04/15/23 2347    lidocaine (XYLOCAINE) 2 % solution  Every 4 hours PRN        04/15/23 2347              Garlon Hatchet, PA-C 04/15/23 2352    Melene Plan, DO 04/16/23 [phone]

## 2023-04-15 NOTE — ED Triage Notes (Signed)
Pt arrives c/o intermittent R sided headache x3 weeks. States that she chipped/broke a R upper molar a few months ago and believes that is what's causing her pain. Has been taking excedrine for the pain - last dose around 1800. Denies fever/chills. Took 1 tablet of amoxicillin that she had leftover a few hours ago.

## 2023-04-16 DIAGNOSIS — K0889 Other specified disorders of teeth and supporting structures: Secondary | ICD-10-CM | POA: Diagnosis not present

## 2023-04-16 NOTE — ED Notes (Signed)
Discharge papers reviewed with pt, pt ambulatory from ED 

## 2023-04-17 ENCOUNTER — Encounter (HOSPITAL_COMMUNITY): Payer: Self-pay

## 2023-04-17 ENCOUNTER — Other Ambulatory Visit: Payer: Self-pay

## 2023-04-17 ENCOUNTER — Emergency Department (HOSPITAL_COMMUNITY)
Admission: EM | Admit: 2023-04-17 | Discharge: 2023-04-17 | Disposition: A | Discharge status: Home / Self Care | Payer: BC Managed Care – PPO | Attending: Emergency Medicine | Admitting: Emergency Medicine

## 2023-04-17 DIAGNOSIS — K0889 Other specified disorders of teeth and supporting structures: Secondary | ICD-10-CM

## 2023-04-17 DIAGNOSIS — K029 Dental caries, unspecified: Secondary | ICD-10-CM | POA: Insufficient documentation

## 2023-04-17 MED ORDER — PENICILLIN V POTASSIUM 500 MG PO TABS: 500.0000 mg | ORAL_TABLET | Freq: Four times a day (QID) | ORAL | 0 refills | Status: AC

## 2023-04-17 MED ORDER — PENICILLIN V POTASSIUM 500 MG PO TABS
500.0000 mg | ORAL_TABLET | Freq: Once | ORAL | Status: DC
  Filled 2023-04-17: qty 1

## 2023-04-17 MED ORDER — LIDOCAINE VISCOUS HCL 2 % MT SOLN
15.0000 mL | Freq: Once | OROMUCOSAL | Status: AC
  Administered 2023-04-17: 15 mL via OROMUCOSAL
  Filled 2023-04-17: qty 15

## 2023-04-17 MED ORDER — NAPROXEN 500 MG PO TABS
500.0000 mg | ORAL_TABLET | Freq: Once | ORAL | Status: DC
  Filled 2023-04-17: qty 1

## 2023-04-17 MED ORDER — NAPROXEN 500 MG PO TABS: 500.0000 mg | ORAL_TABLET | Freq: Two times a day (BID) | ORAL | 0 refills | Status: DC

## 2023-04-17 NOTE — ED Triage Notes (Signed)
Patient arrives with dental pain on the right side. Patient states she was here yesterday but unable to pickup her prescriptions because the pharmacy was closed. Rates pain 9/10.

## 2023-04-17 NOTE — ED Provider Notes (Signed)
[place] EMERGENCY DEPARTMENT AT [facility] Provider Note   CSN: [id number] Arrival date & time: 04/17/23  7829     History  Chief Complaint  Patient presents with   Dental Pain    Teresa Harrington is a 27 y.o. female.  The history is provided by the patient.  Dental Pain Location:  Upper Quality:  Aching Severity:  Moderate Onset quality:  Gradual Timing:  Constant Progression:  Worsening Chronicity:  New Context: not dental caries   Relieved by:  Nothing Worsened by:  Nothing Ineffective treatments:  None tried Associated symptoms: no congestion and no facial swelling   Risk factors: no alcohol problem   Reports could not get RX filled as pharmacy was reportedly closed for holiday.       Home Medications Prior to Admission medications   Medication Sig Start Date End Date Taking? Authorizing Provider  lidocaine (XYLOCAINE) 2 % solution Use as directed 15 mLs in the mouth or throat every 4 (four) hours as needed for mouth pain. 04/15/23   Garlon Hatchet, PA-C  naproxen (NAPROSYN) 500 MG tablet Take 1 tablet (500 mg total) by mouth 2 (two) times daily. 04/17/23   Christena Sunderlin, MD  penicillin v potassium (VEETID) 500 MG tablet Take 1 tablet (500 mg total) by mouth 4 (four) times daily for 7 days. 04/17/23 04/24/23  Chasady Longwell, MD      Allergies    Tramadol and Other    Review of Systems   Review of Systems  HENT:  Positive for dental problem. Negative for congestion and facial swelling.   Eyes:  Negative for redness.  Respiratory:  Negative for wheezing and stridor.     Physical Exam Updated Vital Signs BP (!) 143/98 (BP Location: Right Arm)   Pulse 67   Temp 98.5 F (36.9 C) (Oral)   Resp 17   LMP 03/29/2023 (Exact Date)   SpO2 98%  Physical Exam Vitals and nursing note reviewed.  Constitutional:      General: She is not in acute distress.    Appearance: Normal appearance. She is well-developed.  HENT:     Head: Normocephalic and  atraumatic.     Nose: Nose normal.     Mouth/Throat:     Comments: Dental caries  Eyes:     Pupils: Pupils are equal, round, and reactive to light.  Cardiovascular:     Rate and Rhythm: Normal rate and regular rhythm.     Pulses: Normal pulses.     Heart sounds: Normal heart sounds.  Pulmonary:     Effort: Pulmonary effort is normal. No respiratory distress.     Breath sounds: Normal breath sounds.  Abdominal:     General: Bowel sounds are normal. There is no distension.     Palpations: Abdomen is soft.     Tenderness: There is no abdominal tenderness. There is no guarding or rebound.  Genitourinary:    Vagina: No vaginal discharge.  Musculoskeletal:        General: Normal range of motion.     Cervical back: Neck supple.  Skin:    General: Skin is dry.     Capillary Refill: Capillary refill takes less than 2 seconds.     Findings: No erythema or rash.  Neurological:     General: No focal deficit present.     Mental Status: She is alert.     Deep Tendon Reflexes: Reflexes normal.  Psychiatric:        Mood and  Affect: Mood normal.     ED Results / Procedures / Treatments   Labs (all labs ordered are listed, but only abnormal results are displayed) Labs Reviewed - No data to display  EKG None  Radiology No results found.  Procedures Procedures    Medications Ordered in ED Medications  penicillin v potassium (VEETID) tablet 500 mg (has no administration in time range)  naproxen (NAPROSYN) tablet 500 mg (has no administration in time range)  lidocaine (XYLOCAINE) 2 % viscous mouth solution 15 mL (15 mLs Mouth/Throat Given 04/17/23 0305)    ED Course/ Medical Decision Making/ A&P                             Medical Decision Making Amount and/or Complexity of Data Reviewed External Data Reviewed: notes.    Details: Previous notes reviewed   Risk Prescription drug management. Risk Details: Printed RX for penicillin and naproxen so they could be filled at any  pharmacy.  Patient needs to follow up with dentistry.  Stable for discharge.      Final Clinical Impression(s) / ED Diagnoses Final diagnoses:  Pain, dental  Return for intractable cough, coughing up blood, fevers > 100.4 unrelieved by medication, shortness of breath, intractable vomiting, chest pain, shortness of breath, weakness, numbness, changes in speech, facial asymmetry, abdominal pain, passing out, Inability to tolerate liquids or food, cough, altered mental status or any concerns. No signs of systemic illness or infection. The patient is nontoxic-appearing on exam and vital signs are within normal limits.  I have reviewed the triage vital signs and the nursing notes. Pertinent labs & imaging results that were available during my care of the patient were reviewed by me and considered in my medical decision making (see chart for details). After history, exam, and medical workup I feel the patient has been appropriately medically screened and is safe for discharge home. Pertinent diagnoses were discussed with the patient. Patient was given return precautions.   Rx / DC Orders ED Discharge Orders          Ordered    naproxen (NAPROSYN) 500 MG tablet  2 times daily        04/17/23 0340    penicillin v potassium (VEETID) 500 MG tablet  4 times daily        04/17/23 0340              Keywon Mestre, MD 04/17/23 2956

## 2023-04-25 ENCOUNTER — Encounter: Payer: Medicaid Other | Admitting: Family Medicine

## 2023-05-12 ENCOUNTER — Ambulatory Visit
Admission: EM | Admit: 2023-05-12 | Discharge: 2023-05-12 | Disposition: A | Discharge status: Home / Self Care | Payer: BC Managed Care – PPO | Attending: Urgent Care | Admitting: Urgent Care

## 2023-05-12 DIAGNOSIS — B9689 Other specified bacterial agents as the cause of diseases classified elsewhere: Secondary | ICD-10-CM | POA: Diagnosis not present

## 2023-05-12 DIAGNOSIS — N760 Acute vaginitis: Secondary | ICD-10-CM

## 2023-05-12 MED ORDER — FLUCONAZOLE 150 MG PO TABS: 150.0000 mg | ORAL_TABLET | ORAL | 0 refills | Status: DC

## 2023-05-12 MED ORDER — METRONIDAZOLE 500 MG PO TABS: 500.0000 mg | ORAL_TABLET | Freq: Two times a day (BID) | ORAL | 0 refills | Status: DC

## 2023-05-12 NOTE — ED Triage Notes (Signed)
Pt c/o vaginal d/c x 3 days-NAD-steady gait 

## 2023-05-12 NOTE — ED Provider Notes (Signed)
[facility]  Note:  This document was prepared using Conservation officer, historic buildings and may include unintentional dictation errors.  MRN: [id number] DOB: Sep 15, 1996  Subjective:   Teresa Harrington is a 27 y.o. female presenting for 3-day history of recurrent vaginal discharge and irritation.  Has a history of yeast and BV infections.  Denies fever, n/v, abdominal pain, pelvic pain, rashes, dysuria, urinary frequency, hematuria. No known exposures. Would like complete vaginal cytology.  No concern for pregnancy.   No current facility-administered medications for this encounter.  Current Outpatient Medications:    penicillin v potassium (VEETID) 250 MG tablet, Take 250 mg by mouth 4 (four) times daily., Disp: , Rfl:    lidocaine (XYLOCAINE) 2 % solution, Use as directed 15 mLs in the mouth or throat every 4 (four) hours as needed for mouth pain., Disp: 150 mL, Rfl: 0   naproxen (NAPROSYN) 500 MG tablet, Take 1 tablet (500 mg total) by mouth 2 (two) times daily., Disp: 30 tablet, Rfl: 0   Allergies  Allergen Reactions   Tramadol Hives   Other Hives    AGENT: [facility]     Past Medical History:  Diagnosis Date   Medical history non-contributory      Past Surgical History:  Procedure Laterality Date   NO PAST SURGERIES      Family History  Problem Relation Age of Onset   Healthy Mother    Healthy Father     Social History   Tobacco Use   Smoking status: Never   Smokeless tobacco: Never  Vaping Use   Vaping Use: Never used  Substance Use Topics   Alcohol use: Yes    Comment: occ   Drug use: Yes    Types: Marijuana    ROS   Objective:   Vitals: BP 119/81 (BP Location: Right Arm)   Pulse 60   Temp 97.9 F (36.6 C) (Oral)   Resp 16   LMP 04/22/2023   SpO2 97%   Physical Exam Constitutional:      General: She is not in acute distress.    Appearance: Normal appearance. She is well-developed. She is not ill-appearing, toxic-appearing  or diaphoretic.  HENT:     Head: Normocephalic and atraumatic.     Nose: Nose normal.     Mouth/Throat:     Mouth: Mucous membranes are moist.  Eyes:     General: No scleral icterus.       Right eye: No discharge.        Left eye: No discharge.     Extraocular Movements: Extraocular movements intact.  Cardiovascular:     Rate and Rhythm: Normal rate.  Pulmonary:     Effort: Pulmonary effort is normal.  Skin:    General: Skin is warm and dry.  Neurological:     General: No focal deficit present.     Mental Status: She is alert and oriented to person, place, and time.  Psychiatric:        Mood and Affect: Mood normal.        Behavior: Behavior normal.     Assessment and Plan :   PDMP not reviewed this encounter.  1. Bacterial vaginosis   2. Acute vaginitis    We will treat patient empirically for bacterial vaginosis with Flagyl and for yeast vaginitis with fluconazole.  Labs pending. Counseled patient on potential for adverse effects with medications prescribed/recommended today, ER and return-to-clinic precautions discussed, patient verbalized understanding.    Urban Gibson,  Marquita [place], [place] 05/12/23 [phone]

## 2023-05-14 LAB — CERVICOVAGINAL ANCILLARY ONLY
Bacterial Vaginitis (gardnerella): NEGATIVE
Candida Glabrata: NEGATIVE
Candida Vaginitis: POSITIVE — AB
Chlamydia: NEGATIVE
Comment: NEGATIVE
Comment: NEGATIVE
Comment: NEGATIVE
Comment: NEGATIVE
Comment: NEGATIVE
Comment: NORMAL
Neisseria Gonorrhea: NEGATIVE
Trichomonas: NEGATIVE

## 2023-05-25 ENCOUNTER — Encounter: Payer: Self-pay | Admitting: Family Medicine

## 2023-05-25 ENCOUNTER — Ambulatory Visit: Payer: Medicaid Other | Admitting: Family Medicine

## 2023-05-25 ENCOUNTER — Other Ambulatory Visit (HOSPITAL_COMMUNITY)
Admission: RE | Admit: 2023-05-25 | Discharge: 2023-05-25 | Disposition: A | Discharge status: Home / Self Care | Payer: Medicaid Other | Source: Ambulatory Visit | Attending: Family Medicine | Admitting: Family Medicine

## 2023-05-25 VITALS — BP 106/58 | HR 67 | Ht 62.0 in | Wt 193.0 lb

## 2023-05-25 DIAGNOSIS — Z01419 Encounter for gynecological examination (general) (routine) without abnormal findings: Secondary | ICD-10-CM | POA: Diagnosis present

## 2023-05-25 DIAGNOSIS — Z113 Encounter for screening for infections with a predominantly sexual mode of transmission: Secondary | ICD-10-CM

## 2023-05-25 DIAGNOSIS — N939 Abnormal uterine and vaginal bleeding, unspecified: Secondary | ICD-10-CM | POA: Diagnosis not present

## 2023-05-25 NOTE — Progress Notes (Signed)
ANNUAL EXAM Patient name: Jessica Hansen MRN [id number]  Date of birth: 10/05/1996 Chief Complaint:   Annual Exam  History of Present Illness:   Jessica Hansen is a 27 y.o.  G15P1011  female  being seen today for a routine annual exam.  Current complaints: Had miscarriage in May - was about 4-[redacted] weeks along. Still having irregular bleeding.  No LMP recorded (lmp unknown).    Last pap 2021. Results were: NILM w/ HRHPV negative. H/O abnormal pap: no Last mammogram: n/a     06/21/2021   10:28 AM 04/26/2021    8:39 AM  Depression screen PHQ 2/9  Decreased Interest 0 1  Down, Depressed, Hopeless 0 0  PHQ - 2 Score 0 1  Altered sleeping 0 3  Tired, decreased energy 0 3  Change in appetite 0 0  Feeling bad or failure about yourself  0 0  Trouble concentrating 0   Moving slowly or fidgety/restless 0 0  Suicidal thoughts 0 0  PHQ-9 Score 0 7        04/26/2021    8:39 AM  GAD 7 : Generalized Anxiety Score  Nervous, Anxious, on Edge 1  Control/stop worrying 0  Worry too much - different things 0  Trouble relaxing 1  Restless 0  Easily annoyed or irritable 0  Afraid - awful might happen 0  Total GAD 7 Score 2     Review of Systems:   Pertinent items are noted in HPI Denies any headaches, blurred vision, fatigue, shortness of breath, chest pain, abdominal pain, abnormal vaginal discharge/itching/odor/irritation, problems with periods, bowel movements, urination, or intercourse unless otherwise stated above. Pertinent History Reviewed:  Reviewed past medical,surgical, social and family history.  Reviewed problem list, medications and allergies. Physical Assessment:   Vitals:   05/25/23 0926  BP: (!) 106/58  Pulse: 67  Weight: 193 lb (87.5 kg)  Height: 5\' 2"  (1.575 m)  Body mass index is 35.3 kg/m.        Physical Examination:   General appearance - well appearing, and in no distress  Mental status - alert, oriented to person, place, and time  Psych:  She has a normal  mood and affect  Skin - warm and dry, normal color, no suspicious lesions noted  Chest - effort normal, all lung fields clear to auscultation bilaterally  Heart - normal rate and regular rhythm  Neck:  midline trachea, no thyromegaly or nodules  Breasts - breasts appear normal, no suspicious masses, no skin or nipple changes or axillary nodes  Abdomen - soft, nontender, nondistended, no masses or organomegaly  Pelvic - VULVA: normal appearing vulva with no masses, tenderness or lesions  VAGINA: normal appearing vagina with normal color and discharge, no lesions  CERVIX: normal appearing cervix without discharge or lesions, no CMT  Thin prep pap is done with HR HPV cotesting  UTERUS: uterus is felt to be normal size, shape, consistency and nontender   ADNEXA: No adnexal masses or tenderness noted.  Extremities:  No swelling or varicosities noted  Chaperone present for exam  Assessment & Plan:  1. Encounter for well woman exam with routine gynecological exam - Cytology - PAP  2. Screening for STDs (sexually transmitted diseases) GC/CT off PAP  3. Abnormal uterine bleeding (AUB) Will have patient return for TSH and HCG    Labs/procedures today: none No orders of the defined types were placed in this encounter.   Meds: No orders of the defined types were placed in this  encounter.   Follow-up: No follow-ups on file.  Levie Heritage, DO 05/25/2023 9:43 AM

## 2023-05-25 NOTE — Progress Notes (Signed)
Pt had SAB in May of 2024. Pt states she is still bleeding, denies pain.

## 2023-05-28 LAB — CYTOLOGY - PAP
Adequacy: ABSENT
Chlamydia: NEGATIVE
Comment: NEGATIVE
Comment: NEGATIVE
Comment: NORMAL
Diagnosis: NEGATIVE
Neisseria Gonorrhea: NEGATIVE
Trichomonas: NEGATIVE

## 2023-05-29 ENCOUNTER — Other Ambulatory Visit: Payer: Medicaid Other

## 2023-06-01 ENCOUNTER — Emergency Department (HOSPITAL_COMMUNITY): Payer: BC Managed Care – PPO

## 2023-06-01 ENCOUNTER — Other Ambulatory Visit: Payer: Self-pay

## 2023-06-01 ENCOUNTER — Emergency Department (HOSPITAL_COMMUNITY)
Admission: EM | Admit: 2023-06-01 | Discharge: 2023-06-01 | Disposition: A | Discharge status: Home / Self Care | Payer: BC Managed Care – PPO | Attending: Emergency Medicine | Admitting: Emergency Medicine

## 2023-06-01 DIAGNOSIS — R1110 Vomiting, unspecified: Secondary | ICD-10-CM | POA: Insufficient documentation

## 2023-06-01 DIAGNOSIS — S301XXA Contusion of abdominal wall, initial encounter: Secondary | ICD-10-CM | POA: Diagnosis not present

## 2023-06-01 DIAGNOSIS — M79631 Pain in right forearm: Secondary | ICD-10-CM | POA: Insufficient documentation

## 2023-06-01 DIAGNOSIS — Z041 Encounter for examination and observation following transport accident: Secondary | ICD-10-CM | POA: Diagnosis not present

## 2023-06-01 DIAGNOSIS — S3991XA Unspecified injury of abdomen, initial encounter: Secondary | ICD-10-CM | POA: Diagnosis not present

## 2023-06-01 DIAGNOSIS — R930 Abnormal findings on diagnostic imaging of skull and head, not elsewhere classified: Secondary | ICD-10-CM | POA: Diagnosis not present

## 2023-06-01 DIAGNOSIS — R1111 Vomiting without nausea: Secondary | ICD-10-CM | POA: Diagnosis not present

## 2023-06-01 DIAGNOSIS — S0990XA Unspecified injury of head, initial encounter: Secondary | ICD-10-CM | POA: Diagnosis not present

## 2023-06-01 DIAGNOSIS — Y92410 Unspecified street and highway as the place of occurrence of the external cause: Secondary | ICD-10-CM | POA: Diagnosis not present

## 2023-06-01 DIAGNOSIS — M79662 Pain in left lower leg: Secondary | ICD-10-CM | POA: Diagnosis not present

## 2023-06-01 DIAGNOSIS — R1031 Right lower quadrant pain: Secondary | ICD-10-CM | POA: Diagnosis not present

## 2023-06-01 DIAGNOSIS — K5730 Diverticulosis of large intestine without perforation or abscess without bleeding: Secondary | ICD-10-CM | POA: Diagnosis not present

## 2023-06-01 DIAGNOSIS — S199XXA Unspecified injury of neck, initial encounter: Secondary | ICD-10-CM | POA: Diagnosis not present

## 2023-06-01 DIAGNOSIS — I10 Essential (primary) hypertension: Secondary | ICD-10-CM | POA: Diagnosis not present

## 2023-06-01 DIAGNOSIS — R064 Hyperventilation: Secondary | ICD-10-CM | POA: Diagnosis not present

## 2023-06-01 LAB — HCG, QUANTITATIVE, PREGNANCY: hCG, Beta Chain, Quant, S: <1 m[IU]/mL (ref <5)

## 2023-06-01 MED ORDER — ONDANSETRON 4 MG PO TBDP: 4.0000 mg | ORAL_TABLET | Freq: Once | ORAL | Status: DC

## 2023-06-01 MED ORDER — METHOCARBAMOL 500 MG PO TABS: 500.0000 mg | ORAL_TABLET | Freq: Two times a day (BID) | ORAL | 0 refills | Status: DC

## 2023-06-01 MED ORDER — ACETAMINOPHEN 500 MG PO TABS: 1000.0000 mg | ORAL_TABLET | Freq: Once | ORAL | Status: DC

## 2023-06-01 NOTE — Discharge Instructions (Addendum)
Please take your medications as prescribed. Take tylenol/ibuprofen for pain. I recommend close follow-up with PCP for reevaluation.  Please do not hesitate to return to emergency department if worrisome signs symptoms we discussed become apparent.  
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
## 2023-06-12 ENCOUNTER — Ambulatory Visit: Payer: Medicaid Other | Admitting: Obstetrics and Gynecology

## 2023-08-21 ENCOUNTER — Telehealth: Payer: Self-pay

## 2023-08-21 NOTE — Telephone Encounter (Signed)
Patient called stating she would like to start birth control. Informed patient I will send Dr. Adrian Blackwater a message requesting prescription.   Message to Dr. Adrian Blackwater sent.

## 2023-08-23 ENCOUNTER — Telehealth: Payer: Self-pay | Admitting: Family Medicine

## 2023-08-23 MED ORDER — NORGESTIMATE-ETH ESTRADIOL 0.25-35 MG-MCG PO TABS: 1.0000 | ORAL_TABLET | Freq: Every day | ORAL | 3 refills | Status: DC

## 2023-08-23 NOTE — Telephone Encounter (Signed)
-----   Message from [facility] [place] I sent at 08/21/2023  3:11 PM EDT ----- Regarding: RE: OCP Ah I am sorry, she does want a combined birth control pill ----- Message ----- From: Levie Heritage, DO Sent: 08/21/2023   2:58 PM EDT To: Leola Brazil, CMA Subject: RE: OCP                                        Just to clarify - she does not want the combined birth control pill, but a progesterone only pill? ----- Message ----- From: Leola Brazil, CMA Sent: 08/21/2023   2:16 PM EDT To: Levie Heritage, DO Subject: OCP                                            Hi Dr.   I am sorry for blowing you up today. This patient called stating you two discussed birth control at her last visit. She is not wanting a combination pill of some sort if you could start on her that.  Thank you, Mardella Layman

## 2023-09-18 ENCOUNTER — Ambulatory Visit (INDEPENDENT_AMBULATORY_CARE_PROVIDER_SITE_OTHER): Payer: BC Managed Care – PPO | Admitting: Student

## 2023-09-18 ENCOUNTER — Encounter: Payer: Self-pay | Admitting: Student

## 2023-09-18 ENCOUNTER — Other Ambulatory Visit: Payer: Self-pay

## 2023-09-18 VITALS — BP 125/82 | HR 83 | Wt 180.4 lb

## 2023-09-18 DIAGNOSIS — B977 Papillomavirus as the cause of diseases classified elsewhere: Secondary | ICD-10-CM

## 2023-09-18 DIAGNOSIS — Z1330 Encounter for screening examination for mental health and behavioral disorders, unspecified: Secondary | ICD-10-CM

## 2023-09-18 DIAGNOSIS — Z3202 Encounter for pregnancy test, result negative: Secondary | ICD-10-CM | POA: Diagnosis not present

## 2023-09-18 DIAGNOSIS — Z3009 Encounter for other general counseling and advice on contraception: Secondary | ICD-10-CM

## 2023-09-18 DIAGNOSIS — R87612 Low grade squamous intraepithelial lesion on cytologic smear of cervix (LGSIL): Secondary | ICD-10-CM | POA: Diagnosis not present

## 2023-09-18 LAB — POCT PREGNANCY, URINE: Preg Test, Ur: NEGATIVE

## 2023-09-18 MED ORDER — NORELGESTROMIN-ETH ESTRADIOL 150-35 MCG/24HR TD PTWK
1.0000 | MEDICATED_PATCH | TRANSDERMAL | 6 refills | Status: AC
Start: 2023-09-18 — End: ?

## 2023-09-18 NOTE — Progress Notes (Signed)
  History:  Ms. Koey Gethers is a 27 y.o. G2P1011 who presents to clinic today for initiation of patch.    The following portions of the patient's history were reviewed and updated as appropriate: allergies, current medications, family history, past medical history, social history, past surgical history and problem list.  Review of Systems:  Review of Systems  Constitutional: Negative.   Respiratory: Negative.    Cardiovascular: Negative.   Gastrointestinal: Negative.   Genitourinary: Negative.   Musculoskeletal: Negative.   Neurological: Negative.   Psychiatric/Behavioral: Negative.        Objective:  Physical Exam BP 125/82   Pulse 83   Wt 180 lb 6.4 oz (81.8 kg)   LMP 08/28/2023 (Exact Date)   BMI 30.02 kg/m  Physical Exam Vitals reviewed.  Constitutional:      Appearance: Normal appearance.  Cardiovascular:     Rate and Rhythm: Normal rate.  Pulmonary:     Effort: Pulmonary effort is normal.  Abdominal:     Palpations: Abdomen is soft.     Tenderness: There is no abdominal tenderness.  Genitourinary:    Comments: deferred Musculoskeletal:        General: Normal range of motion.  Neurological:     General: No focal deficit present.     Mental Status: She is alert and oriented to person, place, and time.  Psychiatric:        Mood and Affect: Mood normal.        Behavior: Behavior normal.        Thought Content: Thought content normal.        Judgment: Judgment normal.       Labs and Imaging Results for orders placed or performed in visit on 09/18/23 (from the past 24 hour(s))  Pregnancy, urine POC     Status: None   Collection Time: 09/18/23  2:18 PM  Result Value Ref Range   Preg Test, Ur NEGATIVE NEGATIVE    No results found.  Health Maintenance Due  Topic Date Due   INFLUENZA VACCINE  Never done   COVID-19 Vaccine (1 - 2023-24 season) Never done    Labs, imaging and previous visits in Epic and Care Everywhere reviewed  Assessment & Plan:   1. General counseling and advice for contraceptive management - Reviewed utilization of contraceptive patch. Risks and benefits reviewed.  Questions were answered.  Information was given to patient to review.   - norelgestromin-ethinyl estradiol [employer]) 150-35 MCG/24HR transdermal patch; Place 1 patch onto the skin once a week.  Dispense: 6 patch; Refill: 6 - Follow-up in 3 months  2. 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  Corlis Hove, NP 09/18/2023 3:23 PM

## 2023-09-18 NOTE — Patient Instructions (Signed)
Ethinyl Estradiol; Norelgestromin Patches What is this medication? ETHINYL ESTRADIOL;NORELGESTROMIN (ETH in il es tra DYE ole; nor el JES troe min) prevents ovulation and pregnancy. It belongs to a group of medications called contraceptives. It is a combination of the hormones estrogen and progestin. This medicine may be used for other purposes; ask your health care provider or pharmacist if you have questions. COMMON BRAND NAME(S): Ortho Evra, Xulane, ZAFEMY What should I tell my care team before I take this medication? They need to know if you have or ever had any of these conditions: Abnormal vaginal bleeding Blood clots Blood vessel disease Breast, cervical, endometrial, ovarian, liver, or uterine cancer Diabetes Gallbladder disease Having surgery Heart disease or recent heart attack High blood pressure High cholesterol or triglycerides History of irregular heartbeat or heart valve problems Kidney disease Liver disease Lupus Migraine headaches Protein C or S deficiency Recently had a baby, miscarriage, or abortion Stroke Tobacco use An unusual or allergic reaction to estrogens, progestins, other medications, foods, dyes, or preservatives Pregnant or trying to get pregnant Breastfeeding How should I use this medication? This patch is applied to the skin. Follow the directions on the prescription label. Apply to clean, dry, healthy skin on the buttock, abdomen, upper outer arm or upper torso, in a place where it will not be rubbed by tight clothing. Do not use lotions or other cosmetics on the site where the patch will go. Press the patch firmly in place for 10 seconds to ensure good contact with the skin. Change the patch every 7 days on the same day of the week for 3 weeks. You will then have a break from the patch for 1 week, after which you will apply a new patch. Do not use your medication more often than directed. Contact your care team about the use of this medication in  children. Special care may be needed. This medication has been used in female children who have started having menstrual periods. A patient package insert for the product will be given with each prescription and refill. Read this sheet carefully each time. The sheet may change frequently. Overdosage: If you think you have taken too much of this medicine contact a poison control center or emergency room at once. NOTE: This medicine is only for you. Do not share this medicine with others. What if I miss a dose? You will need to replace your patch once a week as directed. If your patch is lost or falls off, contact your care team for advice. You may need to use another form of birth control if your patch has been off for more than 1 day. What may interact with this medication? Do not take this medication with the following: Dasabuvir; ombitasvir; paritaprevir; ritonavir Ombitasvir; paritaprevir; ritonavir This medication may also interact with the following: Acetaminophen Antibiotics or medications for infections, especially rifampin, rifabutin, rifapentine, penicillins, or tetracyclines Aprepitant or fosaprepitant Armodafinil Ascorbic acid (vitamin C) Barbiturate medications, such as phenobarbital or primidone Bosentan Certain antivirals for HIV or hepatitis Certain medications for cancer treatment Certain medications for cholesterol Certain medications for seizures, such as carbamazepine, clobazam, felbamate, lamotrigine, oxcarbazepine, phenytoin, rufinamide, or topiramate Cyclosporine Dantrolene Elagolix Flibanserin Grapefruit juice Lesinurad Medications for diabetes Medications to treat fungal infections, such as griseofulvin, miconazole, fluconazole, ketoconazole, itraconazole, posaconazole, or voriconazole Mifepristone Mitotane Modafinil Morphine Mycophenolate St. John's wort Tamoxifen Temazepam Theophylline or aminophylline Thyroid hormones Tizanidine Tranexamic  acid Ulipristal Warfarin This list may not describe all possible interactions. Give your health care provider   a list of all the medicines, herbs, non-prescription drugs, or dietary supplements you use. Also tell them if you smoke, drink alcohol, or use illegal drugs. Some items may interact with your medicine. What should I watch for while using this medication? Visit your care team for regular checks on your progress. You will need a regular breast and pelvic exam and Pap smear while on this medication. Use an additional method of contraception during the first cycle that you use this patch. If you have any reason to think you are pregnant, stop using this medication right away and contact your care team. If you are using this medication for hormone related problems, it may take several cycles of use to see improvement in your condition. Smoking tobacco increases the risk of getting a blood clot or having a stroke while you are taking this medication, especially if you are older than 35 years. This medication can make your body retain fluid, making your fingers, hands, or ankles swell. Your blood pressure can go up. Contact your care team if you feel you are retaining fluid. This medication can make you more sensitive to the sun. Keep out of the sun. If you cannot avoid being in the sun, wear protective clothing and sunscreen. Do not use sun lamps, tanning beds, or tanning booths. If you wear contact lenses and notice visual changes, or if the lenses begin to feel uncomfortable, consult your eye care specialist. Tenderness, swelling, or minor bleeding of the gums may occur. Talk to your dentist if this happens. Brushing and flossing your teeth regularly may reduce the risk of side effects. Visit your dentist on a regular basis. Tell your dentist about any medications you are taking. If you are going to have elective surgery or an MRI, tell your care team that you are using this medication. You may  need to remove the patch before the procedure. Using this medication does not protect you or your partner against HIV or other sexually transmitted infections (STIs). What side effects may I notice from receiving this medication? Side effects that you should report to your care team as soon as possible: Allergic reactions--skin rash, itching, hives, swelling of the face, lips, tongue, or throat Blood clot--pain, swelling, or warmth in the leg, shortness of breath, chest pain Gallbladder problems--severe stomach pain, nausea, vomiting, fever Increase in blood pressure Liver injury--right upper belly pain, loss of appetite, nausea, light-colored stool, dark yellow or brown urine, yellowing skin or eyes, unusual weakness or fatigue New or worsening migraines or headaches Stroke--sudden numbness or weakness of the face, arm, or leg, trouble speaking, confusion, trouble walking, loss of balance or coordination, dizziness, severe headache, change in vision Unusual vaginal discharge, itching, or odor Worsening mood, feelings of depression Side effects that usually do not require medical attention (report to your care team if they continue or are bothersome): Breast pain or tenderness Dark patches of skin on the face or other sun-exposed areas Irregular menstrual cycles or spotting Nausea Weight gain This list may not describe all possible side effects. Call your doctor for medical advice about side effects. You may report side effects to FDA at 1-800-FDA-1088. Where should I keep my medication? Keep out of the reach of children and pets. Store at room temperature between 15 and 30 degrees C (59 and 86 degrees F). Keep the patch in its pouch until time of use. Throw away any unused medication after the expiration date. Dispose of used patches properly. Since a used patch may  still contain active hormones, fold the patch in half so that it sticks to itself prior to disposal. Throw away in a place where  children or pets cannot reach. NOTE: This sheet is a summary. It may not cover all possible information. If you have questions about this medicine, talk to your doctor, pharmacist, or health care provider.  2024 Elsevier/Gold Standard (2022-06-13 00:00:00)

## 2023-10-17 ENCOUNTER — Ambulatory Visit: Payer: Medicaid Other

## 2023-10-24 ENCOUNTER — Ambulatory Visit: Payer: Medicaid Other

## 2023-10-31 ENCOUNTER — Ambulatory Visit: Payer: BC Managed Care – PPO

## 2023-11-09 ENCOUNTER — Ambulatory Visit: Payer: BC Managed Care – PPO

## 2023-11-20 ENCOUNTER — Other Ambulatory Visit (HOSPITAL_COMMUNITY)
Admission: RE | Admit: 2023-11-20 | Discharge: 2023-11-20 | Disposition: A | Discharge status: Home / Self Care | Payer: BC Managed Care – PPO | Source: Ambulatory Visit | Attending: Family Medicine | Admitting: Family Medicine

## 2023-11-20 ENCOUNTER — Ambulatory Visit: Payer: BC Managed Care – PPO

## 2023-11-20 ENCOUNTER — Other Ambulatory Visit: Payer: Self-pay

## 2023-11-20 VITALS — BP 117/81 | HR 81 | Ht 66.0 in | Wt 180.0 lb

## 2023-11-20 DIAGNOSIS — Z113 Encounter for screening for infections with a predominantly sexual mode of transmission: Secondary | ICD-10-CM | POA: Insufficient documentation

## 2023-11-20 NOTE — Progress Notes (Signed)
 Teresa Harrington is here with concern of white thick discharge. Patient is unsure if it is yeast infection. Has had unprotected sex and would like to do a vaginal self swab. These symptoms have been present for 2 week.  Self swab instructions given and specimen obtained. Explained patient will be contacted with any abnormal results. Patient is not due for annual exam. 01/26/2024.   Rosaline Pendleton, RN 11/20/2023  3:26 PM

## 2023-11-22 LAB — CERVICOVAGINAL ANCILLARY ONLY
Bacterial Vaginitis (gardnerella): NEGATIVE
Candida Glabrata: NEGATIVE
Candida Vaginitis: NEGATIVE
Chlamydia: NEGATIVE
Comment: NEGATIVE
Comment: NEGATIVE
Comment: NEGATIVE
Comment: NEGATIVE
Comment: NEGATIVE
Comment: NORMAL
Neisseria Gonorrhea: NEGATIVE
Trichomonas: NEGATIVE

## 2023-11-28 ENCOUNTER — Other Ambulatory Visit: Payer: Self-pay

## 2023-11-28 DIAGNOSIS — Z3040 Encounter for surveillance of contraceptives, unspecified: Secondary | ICD-10-CM

## 2023-11-28 MED ORDER — NORGESTIMATE-ETH ESTRADIOL 0.25-35 MG-MCG PO TABS: 1.0000 | ORAL_TABLET | Freq: Every day | ORAL | 3 refills | Status: DC

## 2023-12-04 ENCOUNTER — Other Ambulatory Visit (HOSPITAL_COMMUNITY)
Admission: RE | Admit: 2023-12-04 | Discharge: 2023-12-04 | Disposition: A | Discharge status: Home / Self Care | Payer: Medicaid Other | Source: Ambulatory Visit | Attending: Family Medicine | Admitting: Family Medicine

## 2023-12-04 ENCOUNTER — Ambulatory Visit (INDEPENDENT_AMBULATORY_CARE_PROVIDER_SITE_OTHER): Payer: Medicaid Other

## 2023-12-04 VITALS — BP 133/78 | HR 87 | Ht 62.0 in | Wt 196.0 lb

## 2023-12-04 DIAGNOSIS — Z113 Encounter for screening for infections with a predominantly sexual mode of transmission: Secondary | ICD-10-CM

## 2023-12-04 DIAGNOSIS — N898 Other specified noninflammatory disorders of vagina: Secondary | ICD-10-CM | POA: Insufficient documentation

## 2023-12-04 NOTE — Progress Notes (Signed)
 SUBJECTIVE:  28 y.o. female complains of vaginal odor for 3 days. Denies abnormal vaginal bleeding or significant pelvic pain or fever. No UTI symptoms. Denies history of known exposure to STD.  Patient's last menstrual period was 11/30/2023 (approximate).  OBJECTIVE:  She appears well, afebrile. Urine dipstick: not done.  ASSESSMENT:  Vaginal Odor   PLAN:  GC, chlamydia, trichomonas, BVAG, CVAG probe sent to lab. Treatment: To be determined once lab results are received ROV prn if symptoms persist or worsen.

## 2023-12-06 LAB — CERVICOVAGINAL ANCILLARY ONLY
Bacterial Vaginitis (gardnerella): POSITIVE — AB
Candida Glabrata: NEGATIVE
Candida Vaginitis: NEGATIVE
Chlamydia: NEGATIVE
Comment: NEGATIVE
Comment: NEGATIVE
Comment: NEGATIVE
Comment: NEGATIVE
Comment: NEGATIVE
Comment: NORMAL
Neisseria Gonorrhea: NEGATIVE
Trichomonas: NEGATIVE

## 2023-12-07 MED ORDER — METRONIDAZOLE 1.3 % VA GEL: 1.0000 | Freq: Every day | VAGINAL | 0 refills | Status: AC

## 2023-12-07 NOTE — Addendum Note (Signed)
Addended by: Levie Heritage on: 12/07/2023 08:45 AM   Modules accepted: Orders

## 2023-12-12 ENCOUNTER — Ambulatory Visit: Payer: Medicaid Other | Admitting: Obstetrics & Gynecology

## 2024-01-03 ENCOUNTER — Other Ambulatory Visit: Payer: Self-pay

## 2024-01-03 DIAGNOSIS — N898 Other specified noninflammatory disorders of vagina: Secondary | ICD-10-CM

## 2024-01-03 MED ORDER — METRONIDAZOLE 0.75 % VA GEL: 1.0000 | Freq: Every day | VAGINAL | 0 refills | Status: AC

## 2024-01-03 NOTE — Progress Notes (Signed)
Patient called requesting a refill of Metrogel. Patient states she is having vaginal itching and odor. Metrogel 0.75% insert 1 applicator vaginally every night x 5 nights was sent to her pharmacy. Understanding was voiced.. Dimonique Bourdeau l Chidera Dearcos, CMA

## 2024-01-21 ENCOUNTER — Ambulatory Visit (INDEPENDENT_AMBULATORY_CARE_PROVIDER_SITE_OTHER): Payer: Medicaid Other | Admitting: *Deleted

## 2024-01-21 ENCOUNTER — Other Ambulatory Visit: Payer: Self-pay

## 2024-01-21 VITALS — BP 135/82 | HR 85 | Ht 64.0 in | Wt 184.6 lb

## 2024-01-21 DIAGNOSIS — Z202 Contact with and (suspected) exposure to infections with a predominantly sexual mode of transmission: Secondary | ICD-10-CM

## 2024-01-21 NOTE — Progress Notes (Signed)
 Here for STD testing. States does not need self swab because same partner as when tested in December but wants to do blood test for HIV , RPR, Hepatitis C and Hepatitis B because has not been tested for these since before current partner. Reports they do not use condoms since in monogamous relationship. Advised if any positive results she will be contacted.  Nancy Fetter

## 2024-01-22 ENCOUNTER — Encounter: Payer: Self-pay | Admitting: Family Medicine

## 2024-01-22 LAB — RPR: RPR Ser Ql: NONREACTIVE

## 2024-01-22 LAB — HEPATITIS C ANTIBODY: Hep C Virus Ab: NONREACTIVE

## 2024-01-22 LAB — HIV ANTIBODY (ROUTINE TESTING W REFLEX): HIV Screen 4th Generation wRfx: NONREACTIVE

## 2024-01-22 LAB — HEPATITIS B SURFACE ANTIGEN: Hepatitis B Surface Ag: NEGATIVE

## 2024-02-18 ENCOUNTER — Other Ambulatory Visit: Payer: Self-pay

## 2024-02-18 DIAGNOSIS — Z3040 Encounter for surveillance of contraceptives, unspecified: Secondary | ICD-10-CM

## 2024-02-18 MED ORDER — NORGESTIMATE-ETH ESTRADIOL 0.25-35 MG-MCG PO TABS: 1.0000 | ORAL_TABLET | Freq: Every day | ORAL | 3 refills | Status: AC

## 2024-03-27 ENCOUNTER — Encounter: Payer: Self-pay | Admitting: Family Medicine

## 2024-03-27 ENCOUNTER — Ambulatory Visit: Admitting: Family Medicine

## 2024-03-27 VITALS — BP 112/76 | HR 77 | Ht 63.0 in | Wt 190.0 lb

## 2024-03-27 DIAGNOSIS — B009 Herpesviral infection, unspecified: Secondary | ICD-10-CM

## 2024-03-27 MED ORDER — VALACYCLOVIR HCL 1 G PO TABS: 1000.0000 mg | ORAL_TABLET | Freq: Two times a day (BID) | ORAL | 3 refills | Status: DC

## 2024-03-27 NOTE — Progress Notes (Signed)
   Subjective:    Patient ID: Jessica Hansen, female    DOB: 04-28-1996, 28 y.o.   MRN: [id number]  HPI Patient having "dry area" with discomfort when walking. Started 2 days ago. Went to urgent care, got some blood work, but they advised an appt here.   Review of Systems     Objective:   Physical Exam Vitals reviewed.  Constitutional:      Appearance: Normal appearance.  Genitourinary:   Neurological:     Mental Status: She is alert.         Assessment & Plan:  1. HSV (herpes simplex virus) infection (Primary) The area looks like HSV. Will get culture. Valtrex started. Discussed how to take, precautions during intercourse, recurrent infections, etc. - Herpes simplex virus culture

## 2024-03-30 LAB — HERPES SIMPLEX VIRUS CULTURE

## 2024-04-03 ENCOUNTER — Ambulatory Visit: Payer: Self-pay | Admitting: Family Medicine

## 2024-05-06 ENCOUNTER — Other Ambulatory Visit: Payer: Self-pay

## 2024-05-06 ENCOUNTER — Other Ambulatory Visit (HOSPITAL_COMMUNITY)
Admission: RE | Admit: 2024-05-06 | Discharge: 2024-05-06 | Disposition: A | Discharge status: Home / Self Care | Source: Ambulatory Visit | Attending: Family Medicine | Admitting: Family Medicine

## 2024-05-06 ENCOUNTER — Ambulatory Visit

## 2024-05-06 VITALS — BP 128/70 | HR 82 | Ht 63.0 in | Wt 179.1 lb

## 2024-05-06 DIAGNOSIS — Z113 Encounter for screening for infections with a predominantly sexual mode of transmission: Secondary | ICD-10-CM | POA: Diagnosis present

## 2024-05-06 NOTE — Progress Notes (Signed)
 Pt here today for nurse visit, encounter for STD screening.  Self swab and STD blood panel drawn by pt request.  She denies any symptoms or known exposure, states she's here for elective testing.  Advised pt that she will be contacted by office staff for any abnormal results.  Pt verbalized understanding with no further questions.    Carolynne Citron, RN

## 2024-05-07 ENCOUNTER — Ambulatory Visit: Payer: Self-pay | Admitting: Obstetrics and Gynecology

## 2024-05-07 LAB — CERVICOVAGINAL ANCILLARY ONLY
Bacterial Vaginitis (gardnerella): NEGATIVE
Candida Glabrata: NEGATIVE
Candida Vaginitis: NEGATIVE
Chlamydia: NEGATIVE
Comment: NEGATIVE
Comment: NEGATIVE
Comment: NEGATIVE
Comment: NEGATIVE
Comment: NEGATIVE
Comment: NORMAL
Neisseria Gonorrhea: NEGATIVE
Trichomonas: NEGATIVE

## 2024-05-07 LAB — RPR+HBSAG+HCVAB+...
HIV Screen 4th Generation wRfx: NONREACTIVE
Hep C Virus Ab: NONREACTIVE
Hepatitis B Surface Ag: NEGATIVE
RPR Ser Ql: NONREACTIVE

## 2024-06-18 ENCOUNTER — Ambulatory Visit: Admitting: Obstetrics & Gynecology

## 2024-08-02 ENCOUNTER — Ambulatory Visit: Admitting: Obstetrics & Gynecology

## 2024-08-14 ENCOUNTER — Ambulatory Visit (INDEPENDENT_AMBULATORY_CARE_PROVIDER_SITE_OTHER): Admitting: Family Medicine

## 2024-08-14 ENCOUNTER — Other Ambulatory Visit (HOSPITAL_COMMUNITY)
Admission: RE | Admit: 2024-08-14 | Discharge: 2024-08-14 | Disposition: A | Discharge status: Home / Self Care | Source: Ambulatory Visit | Attending: Family Medicine | Admitting: Family Medicine

## 2024-08-14 VITALS — BP 123/77 | HR 79 | Ht 63.0 in | Wt 186.1 lb

## 2024-08-14 DIAGNOSIS — Z01419 Encounter for gynecological examination (general) (routine) without abnormal findings: Secondary | ICD-10-CM | POA: Diagnosis present

## 2024-08-14 DIAGNOSIS — Z23 Encounter for immunization: Secondary | ICD-10-CM

## 2024-08-14 NOTE — Progress Notes (Signed)
   ANNUAL EXAM Patient name: Jessica Hansen MRN [id number]  Date of birth: 1996/06/16 Chief Complaint:   Annual Exam  History of Present Illness:   Jessica Hansen is a 28 y.o.  [id number]  female  being seen today for a routine annual exam.  Current complaints: none. Regular menses. COC for contraception  Patient's last menstrual period was 08/07/2024 (exact date).    Last pap 2024. Results were: normal. H/O abnormal pap: no Last mammogram: n/a     08/14/2024    9:51 AM 06/21/2021   10:28 AM 04/26/2021    8:39 AM  Depression screen PHQ 2/9  Decreased Interest 1 0 1  Down, Depressed, Hopeless 0 0 0  PHQ - 2 Score 1 0 1  Altered sleeping 0 0 3  Tired, decreased energy 2 0 3  Change in appetite 1 0 0  Feeling bad or failure about yourself  0 0 0  Trouble concentrating 0 0   Moving slowly or fidgety/restless 0 0 0  Suicidal thoughts 0 0 0  PHQ-9 Score 4 0 7        08/14/2024    9:52 AM 04/26/2021    8:39 AM  GAD 7 : Generalized Anxiety Score  Nervous, Anxious, on Edge 0 1  Control/stop worrying 0 0  Worry too much - different things 0 0  Trouble relaxing 0 1  Restless 0 0  Easily annoyed or irritable 0 0  Afraid - awful might happen 0 0  Total GAD 7 Score 0 2     Review of Systems:   Pertinent items are noted in HPI Denies any headaches, blurred vision, fatigue, shortness of breath, chest pain, abdominal pain, abnormal vaginal discharge/itching/odor/irritation, problems with periods, bowel movements, urination, or intercourse unless otherwise stated above. Pertinent History Reviewed:  Reviewed past medical,surgical, social and family history.  Reviewed problem list, medications and allergies. Physical Assessment:   Vitals:   08/14/24 0946  BP: 123/77  Pulse: 79  Weight: 186 lb 1.3 oz (84.4 kg)  Height: 5' 3 (1.6 m)  Body mass index is 32.96 kg/m.        Physical Examination:   General appearance - well appearing, and in no distress  Mental status - alert,  oriented to person, place, and time  Psych:  She has a normal mood and affect  Skin - warm and dry, normal color, no suspicious lesions noted  Chest - effort normal, all lung fields clear to auscultation bilaterally  Heart - normal rate and regular rhythm  Neck:  midline trachea, no thyromegaly or nodules  Breasts - breasts appear normal, no suspicious masses, no skin or nipple changes or axillary nodes  Abdomen - soft, nontender, nondistended, no masses or organomegaly  Pelvic - VULVA: normal appearing vulva with no masses, tenderness or lesions  VAGINA: normal appearing vagina with normal color and discharge, no lesions  CERVIX: normal appearing cervix without discharge or lesions, no CMT  Thin prep pap is not done    Extremities:  No swelling or varicosities noted  Chaperone present for exam  Assessment & Plan:  1. Encounter for well woman exam with routine gynecological exam (Primary)  - RPR+HBsAg+HCVAb+... - Cervicovaginal ancillary only( [place])   Orders Placed This Encounter  Procedures   RPR+HBsAg+HCVAb+...    Meds: No orders of the defined types were placed in this encounter.   Follow-up: No follow-ups on file.  Keyonna Comunale J Tyannah Sane, DO 08/14/2024 1:09 PM

## 2024-08-14 NOTE — Progress Notes (Signed)
 Patient presents for Annual.  LMP: Patient's last menstrual period was 08/07/2024 (exact date).  Last pap: Date: 05/25/2023 Contraception: POP Mammogram: Not yet indicated STD Screening: Accepts Flu Vaccine : Accepts  CC:   Fun Fact: Pt is a twin

## 2024-08-14 NOTE — Addendum Note (Signed)
 Addended by: JOMARIE SKIPPER D on: 08/14/2024 03:16 PM   Modules accepted: Orders

## 2024-08-15 ENCOUNTER — Ambulatory Visit: Payer: Self-pay | Admitting: Family Medicine

## 2024-08-15 LAB — RPR+HBSAG+HCVAB+...
HIV Screen 4th Generation wRfx: NONREACTIVE
Hep C Virus Ab: NONREACTIVE
Hepatitis B Surface Ag: NEGATIVE
RPR Ser Ql: NONREACTIVE

## 2024-08-18 LAB — CERVICOVAGINAL ANCILLARY ONLY
Bacterial Vaginitis (gardnerella): POSITIVE — AB
Candida Glabrata: NEGATIVE
Candida Vaginitis: NEGATIVE
Chlamydia: NEGATIVE
Comment: NEGATIVE
Comment: NEGATIVE
Comment: NEGATIVE
Comment: NEGATIVE
Comment: NEGATIVE
Comment: NORMAL
Neisseria Gonorrhea: NEGATIVE
Trichomonas: NEGATIVE

## 2024-08-19 ENCOUNTER — Other Ambulatory Visit: Payer: Self-pay

## 2024-08-19 ENCOUNTER — Ambulatory Visit

## 2024-08-19 DIAGNOSIS — B9689 Other specified bacterial agents as the cause of diseases classified elsewhere: Secondary | ICD-10-CM

## 2024-08-19 MED ORDER — METRONIDAZOLE 500 MG PO TABS: 500.0000 mg | ORAL_TABLET | Freq: Two times a day (BID) | ORAL | 0 refills | Status: AC

## 2024-08-26 ENCOUNTER — Ambulatory Visit

## 2024-09-01 ENCOUNTER — Ambulatory Visit

## 2024-09-11 ENCOUNTER — Other Ambulatory Visit: Payer: Self-pay | Admitting: Physician Assistant

## 2024-09-11 DIAGNOSIS — R200 Anesthesia of skin: Secondary | ICD-10-CM

## 2024-09-16 ENCOUNTER — Other Ambulatory Visit

## 2024-09-17 ENCOUNTER — Ambulatory Visit
Admission: RE | Admit: 2024-09-17 | Discharge: 2024-09-17 | Disposition: A | Discharge status: Home / Self Care | Source: Ambulatory Visit | Attending: Physician Assistant | Admitting: Physician Assistant

## 2024-09-17 ENCOUNTER — Other Ambulatory Visit: Payer: Self-pay | Admitting: Physician Assistant

## 2024-09-17 ENCOUNTER — Encounter: Payer: Self-pay | Admitting: Physician Assistant

## 2024-09-17 DIAGNOSIS — R200 Anesthesia of skin: Secondary | ICD-10-CM

## 2024-09-30 ENCOUNTER — Other Ambulatory Visit: Payer: Self-pay

## 2024-09-30 MED ORDER — FLUCONAZOLE 150 MG PO TABS: 150.0000 mg | ORAL_TABLET | Freq: Once | ORAL | 0 refills | Status: AC

## 2024-09-30 NOTE — Progress Notes (Signed)
 Patient was prescribed antibiotic recently for BV. Patient is now how vaginal itching and states she definitely has a yeast infection.  Pharmacy verified, placed order for diflucan  per protocol.  Delon Barrio RN

## 2024-10-02 ENCOUNTER — Ambulatory Visit (INDEPENDENT_AMBULATORY_CARE_PROVIDER_SITE_OTHER)

## 2024-10-02 ENCOUNTER — Ambulatory Visit

## 2024-10-02 ENCOUNTER — Other Ambulatory Visit (HOSPITAL_COMMUNITY)
Admission: RE | Admit: 2024-10-02 | Discharge: 2024-10-02 | Disposition: A | Source: Ambulatory Visit | Attending: Family Medicine | Admitting: Family Medicine

## 2024-10-02 VITALS — BP 122/85 | HR 91 | Ht 63.0 in | Wt 189.0 lb

## 2024-10-02 DIAGNOSIS — N898 Other specified noninflammatory disorders of vagina: Secondary | ICD-10-CM | POA: Insufficient documentation

## 2024-10-02 NOTE — Progress Notes (Signed)
 SUBJECTIVE:  28 y.o. female who desires a STI screen. Denies abnormal vaginal discharge, bleeding or significant pelvic pain. No UTI symptoms. Denies history of known exposure to STD.  Patient's last menstrual period was 09/11/2024 (exact date).  OBJECTIVE:  She appears well.   ASSESSMENT:  STI Screen   PLAN:  Pt offered STI blood screening-declined GC, chlamydia, and trichomonas probe sent to lab.  Treatment: To be determined once lab results are received.  Pt follow up as needed.  Shawnee Fleet, CMA

## 2024-10-06 LAB — CERVICOVAGINAL ANCILLARY ONLY
Bacterial Vaginitis (gardnerella): NEGATIVE
Candida Glabrata: NEGATIVE
Candida Vaginitis: POSITIVE — AB
Comment: NEGATIVE
Comment: NEGATIVE
Comment: NEGATIVE

## 2024-10-07 ENCOUNTER — Other Ambulatory Visit: Payer: Self-pay

## 2024-10-07 ENCOUNTER — Ambulatory Visit: Payer: Self-pay | Admitting: Family Medicine

## 2024-10-07 DIAGNOSIS — B379 Candidiasis, unspecified: Secondary | ICD-10-CM

## 2024-10-07 MED ORDER — FLUCONAZOLE 150 MG PO TABS: 150.0000 mg | ORAL_TABLET | Freq: Once | ORAL | 0 refills | Status: AC

## 2024-10-08 ENCOUNTER — Other Ambulatory Visit: Payer: Self-pay

## 2024-10-08 DIAGNOSIS — B379 Candidiasis, unspecified: Secondary | ICD-10-CM

## 2024-10-08 MED ORDER — FLUCONAZOLE 150 MG PO TABS: 150.0000 mg | ORAL_TABLET | Freq: Once | ORAL | 0 refills | Status: AC

## 2024-10-09 ENCOUNTER — Ambulatory Visit (INDEPENDENT_AMBULATORY_CARE_PROVIDER_SITE_OTHER): Admitting: Family Medicine

## 2024-10-09 VITALS — BP 115/64 | HR 84 | Wt 194.0 lb

## 2024-10-09 DIAGNOSIS — L293 Anogenital pruritus, unspecified: Secondary | ICD-10-CM

## 2024-10-09 NOTE — Progress Notes (Signed)
   Acute Office Visit  Subjective:     Patient ID: Denika Krone, female    DOB: 1996/07/28, 28 y.o.   MRN: [id number]  No chief complaint on file.   HPI  Discussed the use of AI scribe software for clinical note transcription with the patient, who gave verbal consent to proceed.  History of Present Illness Raziah Funnell is a 28 year old female who presents with a recurrent painful bump between her breasts.  She has been experiencing a recurrent painful bump located between her breasts for the past few months. The bump is painful, especially when walking or wiping, but does not break open or discharge any fluid. She is unsure of the cause, mentioning a concern about wiping back to front as a potential factor.  The bump appears for a few days and then disappears, with the last occurrence being a few days ago. It reappears every few weeks. During the current visit, the bump is not present, and she is unable to feel anything when wiping.  She attempted to capture a video of the bump during its last occurrence, noting that the area appeared irritated at that time. She describes the location as the lower labial area.     ROS Per HPI      Objective:    BP 115/64 (BP Location: Left Arm, Patient Position: Sitting, Cuff Size: Large)   Pulse 84   Wt 194 lb (88 kg)   LMP 09/10/2024 (Exact Date)   BMI 34.37 kg/m    Physical Exam Vitals reviewed. Exam conducted with a chaperone present.  Genitourinary:    Comments: Normal perieum Neurological:     General: No focal deficit present.     Mental Status: She is alert.  Psychiatric:        Mood and Affect: Mood normal.        Behavior: Behavior normal.        Thought Content: Thought content normal.        Judgment: Judgment normal.     No results found for any visits on 10/09/24.      Assessment & Plan:   Assessment and Plan Assessment & Plan Intermittent painful vulvar bump Intermittent painful bump in the lower labial  area, appearing every few weeks and resolving spontaneously. No infection or ulceration noted. - Advised to call the clinic when the bump reappears for examination. - Documented in the chart to facilitate scheduling an appointment when the bump is present.     No orders of the defined types were placed in this encounter.    No orders of the defined types were placed in this encounter.   No follow-ups on file.  Xee Hollman J Winnie Umali, DO

## 2024-10-27 ENCOUNTER — Other Ambulatory Visit: Payer: Self-pay | Admitting: Family Medicine

## 2024-12-31 ENCOUNTER — Ambulatory Visit: Payer: Self-pay
